# Patient Record
Sex: Female | Born: 1945 | Race: White | Hispanic: No | Marital: Married | Smoking: Never smoker
Health system: Southern US, Community
[De-identification: ages and names within clinical notes are randomized; demographics above are authoritative.]

## PROBLEM LIST (undated history)

## (undated) DIAGNOSIS — E039 Hypothyroidism, unspecified: Secondary | ICD-10-CM

## (undated) DIAGNOSIS — K219 Gastro-esophageal reflux disease without esophagitis: Secondary | ICD-10-CM

## (undated) DIAGNOSIS — F419 Anxiety disorder, unspecified: Secondary | ICD-10-CM

## (undated) DIAGNOSIS — IMO0001 Reserved for inherently not codable concepts without codable children: Secondary | ICD-10-CM

## (undated) DIAGNOSIS — M1990 Unspecified osteoarthritis, unspecified site: Secondary | ICD-10-CM

## (undated) DIAGNOSIS — C801 Malignant (primary) neoplasm, unspecified: Secondary | ICD-10-CM

## (undated) DIAGNOSIS — I10 Essential (primary) hypertension: Secondary | ICD-10-CM

## (undated) DIAGNOSIS — F329 Major depressive disorder, single episode, unspecified: Secondary | ICD-10-CM

## (undated) DIAGNOSIS — D759 Disease of blood and blood-forming organs, unspecified: Secondary | ICD-10-CM

## (undated) DIAGNOSIS — R251 Tremor, unspecified: Secondary | ICD-10-CM

## (undated) DIAGNOSIS — F32A Depression, unspecified: Secondary | ICD-10-CM

## (undated) DIAGNOSIS — E079 Disorder of thyroid, unspecified: Secondary | ICD-10-CM

## (undated) HISTORY — PX: KNEE SURGERY: SHX244

## (undated) HISTORY — DX: Anxiety disorder, unspecified: F41.9

## (undated) HISTORY — DX: Disorder of thyroid, unspecified: E07.9

## (undated) HISTORY — PX: SHOULDER ARTHROSCOPY: SHX128

## (undated) HISTORY — PX: ABDOMINAL HYSTERECTOMY: SHX81

## (undated) HISTORY — DX: Reserved for inherently not codable concepts without codable children: IMO0001

## (undated) HISTORY — PX: RECTOCELE REPAIR: SHX761

## (undated) HISTORY — DX: Essential (primary) hypertension: I10

## (undated) HISTORY — DX: Malignant (primary) neoplasm, unspecified: C80.1

## (undated) HISTORY — DX: Tremor, unspecified: R25.1

## (undated) HISTORY — PX: BLADDER REPAIR: SHX76

## (undated) HISTORY — PX: CHOLECYSTECTOMY: SHX55

## (undated) HISTORY — DX: Gastro-esophageal reflux disease without esophagitis: K21.9

---

## 2002-12-09 ENCOUNTER — Encounter: Payer: Self-pay | Admitting: Orthopedic Surgery

## 2002-12-10 ENCOUNTER — Ambulatory Visit (HOSPITAL_COMMUNITY): Admission: RE | Admit: 2002-12-10 | Discharge: 2002-12-10 | Payer: Self-pay | Admitting: Orthopedic Surgery

## 2003-09-30 ENCOUNTER — Inpatient Hospital Stay (HOSPITAL_COMMUNITY): Admission: RE | Admit: 2003-09-30 | Discharge: 2003-10-01 | Payer: Self-pay | Admitting: Orthopedic Surgery

## 2004-01-15 ENCOUNTER — Observation Stay (HOSPITAL_COMMUNITY): Admission: RE | Admit: 2004-01-15 | Discharge: 2004-01-16 | Payer: Self-pay | Admitting: Orthopedic Surgery

## 2005-04-27 ENCOUNTER — Other Ambulatory Visit: Admission: RE | Admit: 2005-04-27 | Discharge: 2005-04-27 | Payer: Self-pay | Admitting: Obstetrics and Gynecology

## 2006-12-18 ENCOUNTER — Ambulatory Visit (HOSPITAL_BASED_OUTPATIENT_CLINIC_OR_DEPARTMENT_OTHER): Admission: RE | Admit: 2006-12-18 | Discharge: 2006-12-18 | Payer: Self-pay | Admitting: Orthopedic Surgery

## 2007-07-24 ENCOUNTER — Ambulatory Visit (HOSPITAL_COMMUNITY): Admission: RE | Admit: 2007-07-24 | Discharge: 2007-07-25 | Payer: Self-pay | Admitting: Orthopedic Surgery

## 2007-09-06 ENCOUNTER — Encounter: Admission: RE | Admit: 2007-09-06 | Discharge: 2007-09-06 | Payer: Self-pay | Admitting: Orthopedic Surgery

## 2007-09-25 ENCOUNTER — Encounter: Admission: RE | Admit: 2007-09-25 | Discharge: 2007-09-25 | Payer: Self-pay | Admitting: Orthopedic Surgery

## 2007-10-07 ENCOUNTER — Ambulatory Visit (HOSPITAL_BASED_OUTPATIENT_CLINIC_OR_DEPARTMENT_OTHER): Admission: RE | Admit: 2007-10-07 | Discharge: 2007-10-08 | Payer: Self-pay | Admitting: Orthopedic Surgery

## 2010-06-12 ENCOUNTER — Encounter: Payer: Self-pay | Admitting: Orthopedic Surgery

## 2010-10-04 NOTE — Op Note (Signed)
Elizabeth Mcclain, Elizabeth Mcclain              ACCOUNT NO.:  [id number]   MEDICAL RECORD NO.:  [id number]          PATIENT TYPE:  OIB   LOCATION:  2550                         FACILITY:  [facility]   PHYSICIAN:  Marlowe Kays, M.D.  DATE OF BIRTH:  [birth year]   DATE OF PROCEDURE:  07/24/2007  DATE OF DISCHARGE:                               OPERATIVE REPORT   PREOPERATIVE DIAGNOSES:  1. 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  2. Probable superior labrum anterior posterior type tear with      posterior extension.  3. Medial subluxation of the biceps tendon at the biceps pulley.   POSTOPERATIVE DIAGNOSES:  1. Fraying of biceps tendon and rotator cuff and labrum.  2. Significant impingement syndrome with partial-thickness rotator      cuff tear.   OPERATION:  1. Left shoulder arthroscopy with debridement of biceps tendon, labrum      at the biceps anchor and the undersurface of the rotator cuff.  2. Arthroscopic subacromial decompression.  3. Inspection and shaving of partial-thickness bursal surface rotator      cuff tear.   SURGEON:  Marlowe Kays, M.D   ASSISTANT:  Druscilla Brownie. Underwood, P.A.-C.   ANESTHESIA:  General anesthesia preceded by inter-Scalene block.   INDICATIONS FOR PROCEDURE:  As stated in the above diagnoses.  She was  having pain.   DESCRIPTION OF PROCEDURE:  Prophylactic antibiotics.  Satisfactory  general anesthesia.  A beach chair position on the [place] frame.  The  left shoulder girdle was prepped with DuraPrep and draped in a sterile  field.  Time out performed.  The anatomy of the shoulder joint was  marked out.  A posterior soft spot in the lateral portals as well as the  subacromial space were all infiltrated with 0.5% Marcaine with  adrenalin.  Through a posterior soft spot portal atraumatically into the  glenoid humeral joint, on inspection the biceps tendon appeared to be  appropriately located.  There was  considerable fraying of it, both along  the tendon body, but also at its anchor where the labrum was also  minimally disrupted there as well.  Also there was some fraying of the  undersurface of the rotator cuff.  Accordingly, I advanced the scope  between the biceps tendon and subscapularis and then using a switching  stick, made an anterior incision, and I placed a metal cannula over the  switching stick, bringing it into the joint.  I then used a 4.2 shaver  to debride down below the surface of the rotator cuff, the biceps tendon  and the labrum at the biceps anchor.  At no time did there appear to be  a full-thickness rotator cuff tear.  I then redirected the scope in the  subacromial space.  She had a significant impingement problem, and a  good bit of bursal tissue, which I resected through a lateral portal and  using a 4.2 shaver.  I then brought the 90-degree ArthroCare vaporizer  for the lateral portal, and again removing soft tissue from the  undersurface of the acromion.  I followed this with a 4 mm oval bur,  burring down the underneath surface of the acromion.  I went back and  forth between the bur and the vaporizer until we had a wide  decompression, documented with films.  I then inspected the rotator cuff  in the area of the MRI abnormality in the anterior rotator cuff.  I did  find a partial-thickness tear measuring roughly 5 mm in width.  I  debrided this down with the shaver.  I could not penetrate the joint  through the rotator cuff with the shaver, so I concluded this was most  likely a partial-thickness tear and felt that now that we had relieved  the impingement problem, that an open procedure with tacking down was  not indicated.  Her family will be so notified of this decision.  We  then removed all fluid possible from the subacromial space. There was no  unusual bleeding.  The three portals were closed with #4-0 nylon and  infiltrated with 0.5% Marcaine with  adrenalin as well as the subacromial  space.  A Betadine and dry sterile dressing and shoulder immobilizer  were applied.   She tolerated the procedure well and was taken to the recovery room in  satisfactory condition, with no known complications.           ______________________________  Marlowe Kays, M.D.     JA/MEDQ  D:  07/24/2007  T:  07/24/2007  Job:  [id number]

## 2010-10-04 NOTE — Op Note (Signed)
NAMEREGNIA, MATHWIG              ACCOUNT NO.:  [id number]   MEDICAL RECORD NO.:  [id number]          PATIENT TYPE:  AMB   LOCATION:  DSC                          FACILITY:  [facility]   PHYSICIAN:  Marlowe Kays, M.D.  DATE OF BIRTH:  [birth year]   DATE OF PROCEDURE:  DATE OF DISCHARGE:  12/18/2006                               OPERATIVE REPORT   PREOPERATIVE DIAGNOSIS:  1. Torn medial and lateral menisci.  2. Osteoarthritis right knee.   POSTOPERATIVE DIAGNOSIS:  1. Torn medial and lateral menisci.  2. Osteoarthritis right knee.   OPERATION:  Right knee arthroscopy with:  1. Partial medial and lateral meniscectomy.  2. Shaving of medial femoral condyle and lateral tibial plateau.  3. Debridement of patella.   SURGEON:  Dr. Marlowe Kays.   ASSISTANT:  Nurse.   ANESTHESIA:  General.   DESCRIPTION OF PROCEDURE:  She has tricompartmental arthritic changes on  x-ray and on an MRI of 11/16/2006 which also demonstrated tears of both  menisci.  Accordingly, she is here today for the above-mentioned  surgery.   PROCEDURE:  After satisfactory general anesthesia, Ace wrap and knee  supports to the left lower extremity.  Pneumatic tourniquet to right  lower extremity with right leg Esmarched out, non sterilely.  Thigh  stabilizer applied.  Right leg was prepped from stabilizer to ankle with  DuraPrep and draped in a sterile field.  Time-out performed.  Superior  medial saline inflow first through an anterolateral portal.  The medial  compartment joint was evaluated.  Minor synovectomy was performed.  She  had extensive tearing of the entire posterior third of the medial  meniscus as well as a small area of flap disruption of the articular  cartilage, Grade II out of IV for posteriorly.  I smoothed down the flap  and resected the torn meniscus back to stable rim with combination of  baskets and using the 3.5 shaver.  Final remnant was stable.  I then  reversed portals.  She was  noted to have partial disruption of her ACL  which was pictured.  The lateral meniscus was frayed along the entire  inner border.  The anterior third was somewhat movable but appeared to  be intact and was left intact.  I shaved the inner border of the lateral  meniscus with 3.5 shaver and also smoothed down th lateral tibial  plateau.  There was a fairly extensive area of full-thickness wear.  Looking at the lateral gutter and suprapatellar area, the patella was  noted to be worn, Grade III out of IV, and was shaved down until smooth.  The joint was then irrigated until clear and all fluid possible removed.  The portals were closed with 4-0 nylon.  I injected 20 mL 0.5% Marcaine  with adrenalin, 4 mg of morphine through the inflow  apparatus which was removed and this portal closed with 4-0 nylon as  well.  Medium Adaptic, dry sterile dressing were applied.  Tourniquet  was released.  She tolerated the procedure well and was taken to the  recovery room in satisfactory condition with no known complications.  ______________________________  Marlowe Kays, M.D.     JA/MEDQ  D:  12/18/2006  T:  12/19/2006  Job:  [id number]

## 2010-10-04 NOTE — Op Note (Signed)
[name], Elizabeth Mcclain              ACCOUNT NO.:  [id number]   MEDICAL RECORD NO.:  [id number]          PATIENT TYPE:  AMB   LOCATION:  DSC                          FACILITY:  [facility]   PHYSICIAN:  Marlowe Kays, M.D.  DATE OF BIRTH:  [birth year]   DATE OF PROCEDURE:  10/07/2007  DATE OF DISCHARGE:  10/08/2007                               OPERATIVE REPORT   PREOPERATIVE DIAGNOSIS:  Rotator cuff tear, left shoulder.   POSTOPERATIVE DIAGNOSIS:  Rotator cuff tear, left shoulder.   OPERATION:  Anterior acromionectomy and repair of torn rotator cuff,  left shoulder.   SURGEON:  Marlowe Kays, MD   ASSISTANT:  Extra scrub nurse.   ANESTHESIA:  General.   PATHOLOGY AND INDICATIONS FOR PROCEDURE:  I performed an arthroscopic  subacromial decompression on her left shoulder on July 24, 2007.  She  subsequently developed pain and loss of motion leading to an arthrogram  of her left shoulder performed on Sep 25, 2007, which I personally  reviewed showing leakage of dye in the subacromial space without  retraction of the tendon.  Consequently, she is here for the above-  mentioned surgery.   PROCEDURE:  Preoperative interscalene block, prophylactic antibiotics,  satisfied general anesthesia in the beach-chair position on [place]  frame.  Left shoulder girdle was prepped with DuraPrep and draped in  sterile field.  [place] employed.  Time-out performed.  I made a short  incision over the distal acromion and proximal deltoid area.  With the  subperiosteal dissection, I dissected the fascia over the anterior  acromion back roughly to the [facility] joint, and undermined the residual  acromion with a Therapist, nutritional and some joint fluid came forth  confirming that there was a tear.  I then used a larger Cobb elevator  and performed an open anterior acromionectomy with a micro-saw to  provide not only better exposure but also additional decompression.  We  then looked for the rotator cuff tear and did  not see one initially.  There was an area of little soft tissue flap, which I excised; and there  was some bleeding there, and I cauterized this area.  There were several  other soft areas, but no definitive tear.  Accordingly, I mixed up a  solution of methylene blue and saline and injected through the posterior  capsule.  There was no frank extravasation of dye through the rotator  cuff with several areas superior to the greater tuberosity, where  robin's-egg blue and somewhat bulging consistent with areas of very  thinned out rotator cuff and suspicious for areas of tear that had  partly sealed over.  After assessing the pathology, I elected to use a  two-pronged Mitek anchor, which allowed me to cinch down the rotator  cuff lateral ward; and I supplemented this after tying the sutures down  with multiple sutures of #1 Ethibond from the anchor.  This seemed to  give a nice tight repair.  Once again; we, three of [place], inspected the  rotator cuff and found no evidence for any tear elsewhere.  I then  irrigated the shoulder well with  sterile saline and  closed the fascia over the anterior acromion as well as a small slit in  the deltoid with interrupted 0 Vicryl.  Steri-Strips on the skin.  Dry  sterile dressing and shoulder immobilizer applied.  She tolerated the  procedure well and was taken to recovery room in satisfactory condition  with no known complications.           ______________________________  Marlowe Kays, M.D.     JA/MEDQ  D:  10/07/2007  T:  10/08/2007  Job:  [id number]

## 2010-10-07 NOTE — Op Note (Signed)
NAME:  Elizabeth Mcclain, Elizabeth Mcclain                        ACCOUNT NO.:  [id number]   MEDICAL RECORD NO.:  [id number]                   PATIENT TYPE:  OBV   LOCATION:  0470                                 FACILITY:  [facility]   PHYSICIAN:  Marlowe Kays, M.D.               DATE OF BIRTH:  07/22/1945   DATE OF PROCEDURE:  01/15/2004  DATE OF DISCHARGE:  01/16/2004                                 OPERATIVE REPORT   PREOPERATIVE DIAGNOSES:  Right carpal tunnel syndrome.   POSTOPERATIVE DIAGNOSES:  Right carpal tunnel syndrome.   OPERATION:  Decompression of the median nerve right wrist and hand.   SURGEON:  Marlowe Kays, M.D.   ASSISTANT:  Nurse.   ANESTHESIA:  General.   PATHOLOGY AND JUSTIFICATION FOR PROCEDURE:  Signs and symptoms of carpal  tunnel syndrome with nerve conduction studies demonstrating moderately  severe damage to the median nerve at surgery. She did have marked  discoloration thinning out of the median nerve in the carpal canal.   DESCRIPTION OF PROCEDURE:  IV regional was first attempted but was  unsuccessful and accordingly proceeded with a general anesthetic.  Pneumatic  tourniquet utilized. I marked out the incision along the base of thenar  eminence crossing obliquely over the flexor crease of the wrist in the  distal forearm. The palmaris longus tendon beneath the median nerve were  identified at the wrist.  Bipolar cautery utilized. I progressively released  the skin and fascia into the distal palm. Individual nerve branches were  dissected out. When the decompression was completed, I irrigated the wound  well with sterile saline, released the tourniquet, there were no major  bleeders.  I injected the wound with 0.5% plain Marcaine and closed it with  interrupted 4-0 nylon mattress sutures and the skin and subcutaneous tissue.  Betadine Adaptic dry sterile dressing with a volar plaster splint were  applied. She tolerated the procedure well and was taken to the  recovery room  in satisfactory condition with no known complications.                                               Marlowe Kays, M.D.    JA/MEDQ  D:  01/15/2004  T:  01/16/2004  Job:  [id number]

## 2010-10-07 NOTE — Op Note (Signed)
NAME:  Elizabeth Mcclain, Elizabeth Mcclain                        ACCOUNT NO.:  [id number]   MEDICAL RECORD NO.:  [id number]                   PATIENT TYPE:  AMB   LOCATION:  DAY                                  [facility]ACILITY:  [facility]   PHYSICIAN:  Marlowe Kays, M.D.               DATE OF BIRTH:  22-May-1946   DATE OF PROCEDURE:  12/10/2002  DATE OF DISCHARGE:                                 OPERATIVE REPORT   PREOPERATIVE DIAGNOSIS:  Torn medial meniscus, left knee.   POSTOPERATIVE DIAGNOSIS:  Torn medial and lateral menisci, left knee.   OPERATION:  Left knee arthroscopy with partial medial meniscectomy.   SURGEON:  Illene Labrador. [place]ton, M.D.   ASSISTANT:  Nurse.   ANESTHESIA:  General.   PATHOLOGY AND JUSTIFICATION FOR PROCEDURE:  She has had pain in the left  knee for over three months.  No definite injury.  Findings on exam were  nonspecific with x-rays looking good.  Accordingly, I sent her for an MRI  which was performed on November 18, 2002, and showed a posterior horn tear of  the medial meniscus.  Accordingly, she is here today for surgery.  Arthroscopic findings confirmed the MRI picture.   DESCRIPTION OF PROCEDURE:  Satisfactory general anesthesia, pneumatic  tourniquet, thigh stabilizer.  Left knee was prepped with DuraPrep and  draped as a sterile field.  Superior and medial saline inflow.  First  through an anterolateral portal, the medial compartment of the knee joint  was evaluated.  She did have some disruption of the anterior third of the  medial meniscus with some synovitis which I resected and smoothed down the  meniscus.  It did appear to be stable on probing with no detachment around  the perimeter.  There was some minimal wear of the medial femoral condyle  which I smoothed down with a 3.5 shaver.  Posteriorly, she had extensive  tear of the posterior curve of the medial meniscus which I resected back to  a stable rim with small baskets and then shaved down until smooth with  the  3.5 shaver.  There did appear to be some slight amount of bleeding from  somewhere in the posterior joint, and I could not tell for certain its  location throughout the case.  I then looked up in the medial gutter and  suprapatellar area.  There was minimal wear in the patella but no  abnormalities arthroscopically were noted.  Looking laterally, there was  minimal wear on the joint surfaces and some minimal fraying of the lateral  meniscus but otherwise, nothing was arthroscopically treated.  The knee  joint was then irrigated until clear and all fluid possible removed.  The  two anterior portals were closed with 4-0 nylon.  Marcaine 0.5% 20 cc with  adrenalin and 4 mg of morphine were then instilled through the inflow  apparatus which was removed and this portal closed with 4-0 nylon as  well.  Betadine, Adaptic dry sterile dressing were applied.  Tourniquet was  released.  She tolerated the procedure and was taken to the recovery room in  satisfactory condition with no known complications.                                               Marlowe Kays, M.D.    JA/MEDQ  D:  12/10/2002  T:  12/10/2002  Job:  [id number]

## 2010-10-07 NOTE — Op Note (Signed)
NAME:  Elizabeth Mcclain, Elizabeth Mcclain                        ACCOUNT NO.:  [id number]   MEDICAL RECORD NO.:  [id number]                   PATIENT TYPE:  AMB   LOCATION:  DAY                                  FACILITY:  [facility]   PHYSICIAN:  Marlowe Kays, M.D.               DATE OF BIRTH:  Nov 09, 1945   DATE OF PROCEDURE:  DATE OF DISCHARGE:                                 OPERATIVE REPORT   PREOPERATIVE DIAGNOSES:  1. Degenerative arthritis, acromioclavicular joint.  2. Full thickness retracted rotator cuff tear, right shoulder.   POSTOPERATIVE DIAGNOSES:  1. Degenerative arthritis, acromioclavicular joint.  2. Full thickness retracted rotator cuff tear, right shoulder.   OPERATION:  1. Open resection, distal right clavicle.  2. Anterior acromionectomy and repair of torn rotator cuff, right shoulder.   SURGEON:  Marlowe Kays, M.D.   ASSISTANT:  John _________, P.A.-C.   ANESTHESIA:  General.   INDICATIONS FOR PROCEDURE:  Chronic progressive pain, right shoulder, with  MRI demonstrating the above-mentioned findings.  The rotator cuff tear was  triangular in shape due to retraction, measuring about 2 cm in width and 1.7  to 2 cm from the greater tuberosity to apex.   PROCEDURE:  Prophylactic antibiotics.  Satisfactory general anesthesia.  Beach chair position on the sliding frame.  The right shoulder was prepped  with Duraprep and draped in the sterile field.  The [place] employed.  I made  a curved incision along the distal clavicle, coursing vertically and at the  mid portion at the rotator cuff.  The [facility] joint was identified and was indeed  quite degenerated.  With cutting cautery, I did pair off the resection with  the distal clavicle and the anterior acromion.  After measuring medially  about 1.5 cm from the [facility] joint, I placed baby Holman's beneath there and  amputated the clavicle at this point, grasping the cupped portion with a tie  clip and dissecting it out with cutting cautery.   I removed several small  bony spicules on the underneath surface of the parent clavicle and then  covered it with bone wax.  Next, I undermined the anterior acromion with  Cobb elevator and marked out my initial line for an anterior acromionectomy,  which I performed, protecting the underlying rotator cuff and using a micro  saw.  I then removed this fragment and performed some additional subacromial  decompression with the saw.  The rotator cuff tear was then identified, as  noted above.  After looking on the underneath surface and finding the biceps  and tendon to be intact and that there was minimal layering, I placed two  apical stitches of #1 Ethibond, bringing the two halves of the rotator cuff  somewhat together.  I then denuded the articular cartilage above the greater  tuberosity and used a super Mitek anchor, and I took a good bite of the two  wings.  With the arm abducted,  I then placed addition side-to-side #1  Ethibond sutures and tied the limbs of the Mitek anchor.  I then further  brought the two wings of the rotator cuff together, giving a nice solid  repair.  There is no residual impingement.  The wound was then irrigated  with sterile saline.  I placed Gelfoam in the clavicle resection site.  I  then repaired the deltoid muscle in two layers with interrupted #1 Vicryl  and fascia over the distal clavicle and  acromion with the same.  Subcutaneous tissue was closed with a combination  of #1 Tisseel Vicryl and Steri-Strips on the skin.  A dry sterile dressing  and short immobilizer applied.  She tolerated the procedure well and was  taken to the recovery room in satisfactory condition with no known  complications.                                               Marlowe Kays, M.D.    JA/MEDQ  D:  09/29/2003  T:  09/29/2003  Job:  [id number]

## 2010-10-07 NOTE — Discharge Summary (Signed)
NAME:  Elizabeth Mcclain, Elizabeth Mcclain                        ACCOUNT NO.:  [id number]   MEDICAL RECORD NO.:  [id number]                   PATIENT TYPE:  INP   LOCATION:  0446                                 FACILITY:  [facility]   PHYSICIAN:  Marlowe Kays, M.D.               DATE OF BIRTH:  August 26, 1945   DATE OF ADMISSION:  09/29/2003  DATE OF DISCHARGE:  10/01/2003                                 DISCHARGE SUMMARY   ADMISSION DIAGNOSES:  1. Degenerative arthritis, right acromioclavicular joint with tear of the     rotator cuff of the right shoulder and impingement.  2. Hypertension.   DISCHARGE DIAGNOSES:  1. Degenerative arthritis, right acromioclavicular joint with tear of the     rotator cuff of the right shoulder and impingement.  2. Hypertension.   OPERATION:  On Sep 29, 2003,  the patient underwent an open resection,  distal right clavicle and anterior acromionectomy and repair of the torn  rotator cuff of the right shoulder.   ASSISTANT:  Dooley L. Idolina Primer, P.A-C.   CONSULTS:  None.   BRIEF HISTORY:  This 65 year old lady had progressive problems concerning  her right shoulder.  She had back pain as well as difficulty with her day-to-  day activities secondary to her pain.  MRI showed a full-thickness tear of  the distal aspect of the supraspinatus tendon and a prominent  acromioclavicular joint with degenerative changes.  Also, a downsloping of  the underside of the acromion, resulting in impingement to the rotator cuff.  As this patient is very active, it is felt that she would benefit from  surgical intervention and was admitted for the above procedure.   HOSPITAL COURSE:  The patient tolerated the surgical procedure quite well.  A shoulder block was used during the surgery.  She did well until the block  wore off.  Then she had a considerable amount of pain and discomfort in the  right shoulder.  We offered her Dilaudid PCA, Demerol tabs, as well as  Talwin for her discomfort.   Muscle relaxants were also used.  Her pain was  so severe that she really could not get out of bed or move about.  We felt  it would be in her best interest to keep her another day until we could wean  her from IV analgesics.  OT was not given on the first postop day, as  ordered, due to the patient's pain.   On the day of discharge, her husband was present.  We went over the  discharge instructions again.  She had a little less discomfort, was  ambulating in the hall, getting up and going to the bathroom.  Still was  using the PCA analgesic.  We will try p.o. analgesics only and see if she  can go home today.   Neurovascular is intact to the right upper extremity.  Vital signs were  stable.   The dressing  had been changed on the first postop day, and the dressing was  dry.   Laboratory values in the hospital hematologically showed a hemoglobin and  hematocrit of 12.8 and 38.1.  Glucose was 102.   Chest x-ray showed mild left ventricular hypertrophy.  No active disease.   Electrocardiogram was normal sinus rhythm.   CONDITION ON DISCHARGE:  Improved.  Stable.   PLAN:  Patient is discharged to her home in the care of her husband.  Return  to see [place] after about two weeks of the date of surgery.  Use dry dressing to  the right shoulder p.r.n.  Wear the sling 90% of the time.  No range of  motion of the right shoulder.  She may shower in four days.  All questions  were encouraged and answered from the husband and her.   DISCHARGE MEDICATIONS:  Continue with home medication and diet.  We wrote  prescriptions for Robaxin 500 mg 1 q.6h. p.r.n. muscle spasm #30 with 2  refills, Mepergan Fortis 1-2 q.4-6h. p.r.n. pain #40,  Talwin NX 1 q.3-4h.  p.r.n. pain #40 with 1 refill.     Dooley L. Cherlynn June.                 Marlowe Kays, M.D.    DLU/MEDQ  D:  10/01/2003  T:  10/01/2003  Job:  [id number]

## 2010-10-07 NOTE — H&P (Signed)
NAME:  Elizabeth Mcclain, Elizabeth Mcclain                        ACCOUNT NO.:  [id number]   MEDICAL RECORD NO.:  [id number]                   PATIENT TYPE:  AMB   LOCATION:  DAY                                  FACILITY:  [facility]   PHYSICIAN:  Marlowe Kays, M.D.               DATE OF BIRTH:  1946/03/03   DATE OF ADMISSION:  DATE OF DISCHARGE:                                HISTORY & PHYSICAL   ANTICIPATED DATE OF ADMISSION:  She is scheduled to be admitted to [facility] on Sep 29, 2003.   CHIEF COMPLAINT:  Pain in my right shoulder.   HISTORY OF PRESENT ILLNESS:  This 65 year old white female who is seen by [place]  for continuing progressive problems concerning pain into her right shoulder.  She recalls no trauma to this shoulder but has had increasing pain and  difficulty using the right shoulder in day-to-day activities.  She has  painful popping and clicking into the shoulder.  She has difficulty sleeping  secondary to the pain in the right shoulder.  Examination of the cervical  spine is unrestricted.  She has no pain radiating from the cervical spine.  It is primarily into the right shoulder.   The patient has pain with range of motion both actively and passively.  At  the time of the examination, she has tenderness to palpation over the  rotator cuff area.  When this is done, occasionally the pain will radiate  into the upper arm into the elbow.  MRI done on the right shoulder has shown  a full-thickness tear in the distal aspect of the supraspinatus tendon.  Also seen was a prominent acromioclavicular joint degenerative changes.  She  also has a downsloping of the acromion and curvature in the anterior aspect  of the acromion.  It is felt this patient has several problems here.  One  was impingement which has resulted in a tear of the rotator cuff of the  right shoulder.  Also a contributing factor to her overall discomfort could  be the [facility] arthrosis.  After much discussion and  consideration concerning the  patient's level of pain interfering with her day-to-day activities, it is  felt she would benefit from surgical intervention with an open anterior  acromionectomy with repair of the torn rotator cuff and open distal clavicle  resection.   PAST MEDICAL HISTORY:  This patient is a patient of the [facility].  She is ALLERGIC to PENICILLIN, PERCODAN and PERCOCET.  Her  current medications are Diovan 25 mg in the evening, Norvasc 5 mg in the  evening, and Paxil 40 mg in the evening.  She does have anxiety and  depression as well as hypertension and reflux disease.   Surgeries have been a hysterectomy in 1997, knee arthroscopy in 2004, and  melanoma removed in 1993.   FAMILY HISTORY:  Positive for heart disease in the father as  well as  hypertension and skin cancer in her father and breast cancer in her mother.   SOCIAL HISTORY:  The patient is married.  Works for [employer].  Has no intake of alcohol or tobacco products and her husband  will be her primary caregiver after surgery.   REVIEW OF SYSTEMS:  CNS:  No seizures, stroke or paralysis, numbness, double  vision.  RESPIRATORY:  No productive cough, no hemoptysis, no shortness of  breath.  CARDIOVASCULAR:  No chest pain, no angina, no orthopnea.  GASTROINTESTINAL:  No nausea, vomiting, melena, or bloody stools.  GENITOURINARY:  No discharge, dysuria, or hematuria.  MUSCULOSKELETAL:  Primarily in the present illness of the right shoulder.   PHYSICAL EXAMINATION:  GENERAL:  Alert and cooperative, friendly, well-  groomed and dressed 65 year old white female who is somewhat anxious.  VITAL SIGNS:  Blood pressure 130/86, pulse 88 regular, respirations 12.  HEENT:  Normocephalic.  PERRLA.  Oropharynx is clear.  NECK:  Supple and no lymphadenopathy.  CHEST:  Clear to auscultation.  No rhonchi, no rales, no wheezes.  HEART:  Regular rate and rhythm.  No murmurs are heard.   ABDOMEN:  Soft, nontender.  Liver, spleen not felt.  GENITALIA, RECTAL, PELVIC, BREASTS:  Not examined.  Not not pertinent to the  present illness.  EXTREMITIES:  The right shoulder as in the present illness above.   ADMISSION DIAGNOSES:  1. 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  2. Hypertension.   PLAN:  The patient will undergo open anterior acromionectomy with repair of  the right rotator cuff and open distal clavicle resection.  The possible  complications of the surgery have been discussed with the patient.  She  agrees to go ahead with the above procedure.     Dooley L. Cherlynn June.                 Marlowe Kays, M.D.    DLU/MEDQ  D:  09/24/2003  T:  09/24/2003  Job:  [id number]

## 2011-02-13 LAB — URINALYSIS, ROUTINE W REFLEX MICROSCOPIC
Bilirubin Urine: NEGATIVE
Glucose, UA: NEGATIVE
Hgb urine dipstick: NEGATIVE
Specific Gravity, Urine: 1.021
Urobilinogen, UA: 0.2

## 2011-02-13 LAB — DIFFERENTIAL
Basophils Relative: 0
Lymphs Abs: 2.8
Monocytes Relative: 10
Neutro Abs: 4.3
Neutrophils Relative %: 52

## 2011-02-13 LAB — CBC
HCT: 41.2
Hemoglobin: 13.6
MCHC: 33.2
MCV: 90
RDW: 14.6

## 2011-02-13 LAB — COMPREHENSIVE METABOLIC PANEL
Alkaline Phosphatase: 68
BUN: 14
Calcium: 9.6
Glucose, Bld: 95
Total Protein: 7.2

## 2011-02-13 LAB — PROTIME-INR
INR: 0.9
Prothrombin Time: 11.8

## 2011-02-13 LAB — TYPE AND SCREEN

## 2011-02-13 LAB — APTT: aPTT: 27

## 2011-02-15 LAB — POCT I-STAT, CHEM 8
BUN: 16
Chloride: 103
Creatinine, Ser: 0.9
Glucose, Bld: 100 — ABNORMAL HIGH
Hemoglobin: 13.9
Potassium: 4.3

## 2011-03-06 LAB — I-STAT 8, (EC8 V) (CONVERTED LAB)
BUN: 22
Chloride: 108
Glucose, Bld: 100 — ABNORMAL HIGH
Hemoglobin: 14.6
Potassium: 3.5
Sodium: 141
pH, Ven: 7.443 — ABNORMAL HIGH

## 2014-12-22 DIAGNOSIS — Z1231 Encounter for screening mammogram for malignant neoplasm of breast: Secondary | ICD-10-CM | POA: Diagnosis not present

## 2014-12-22 DIAGNOSIS — M899 Disorder of bone, unspecified: Secondary | ICD-10-CM | POA: Diagnosis not present

## 2014-12-22 DIAGNOSIS — Z803 Family history of malignant neoplasm of breast: Secondary | ICD-10-CM | POA: Diagnosis not present

## 2015-01-04 DIAGNOSIS — G249 Dystonia, unspecified: Secondary | ICD-10-CM | POA: Insufficient documentation

## 2015-01-04 DIAGNOSIS — G250 Essential tremor: Secondary | ICD-10-CM | POA: Insufficient documentation

## 2015-02-16 DIAGNOSIS — J0100 Acute maxillary sinusitis, unspecified: Secondary | ICD-10-CM | POA: Diagnosis not present

## 2015-04-06 DIAGNOSIS — Z0000 Encounter for general adult medical examination without abnormal findings: Secondary | ICD-10-CM | POA: Diagnosis not present

## 2015-04-06 DIAGNOSIS — Z79899 Other long term (current) drug therapy: Secondary | ICD-10-CM | POA: Diagnosis not present

## 2015-04-06 DIAGNOSIS — R5383 Other fatigue: Secondary | ICD-10-CM | POA: Diagnosis not present

## 2015-04-06 DIAGNOSIS — Z23 Encounter for immunization: Secondary | ICD-10-CM | POA: Diagnosis not present

## 2015-04-06 DIAGNOSIS — D509 Iron deficiency anemia, unspecified: Secondary | ICD-10-CM | POA: Diagnosis not present

## 2015-04-06 DIAGNOSIS — D510 Vitamin B12 deficiency anemia due to intrinsic factor deficiency: Secondary | ICD-10-CM | POA: Diagnosis not present

## 2015-04-06 DIAGNOSIS — E7800 Pure hypercholesterolemia, unspecified: Secondary | ICD-10-CM | POA: Diagnosis not present

## 2015-04-06 DIAGNOSIS — I10 Essential (primary) hypertension: Secondary | ICD-10-CM | POA: Diagnosis not present

## 2015-04-30 DIAGNOSIS — M436 Torticollis: Secondary | ICD-10-CM | POA: Insufficient documentation

## 2015-04-30 DIAGNOSIS — G249 Dystonia, unspecified: Secondary | ICD-10-CM | POA: Diagnosis not present

## 2015-04-30 DIAGNOSIS — R519 Headache, unspecified: Secondary | ICD-10-CM | POA: Insufficient documentation

## 2015-04-30 DIAGNOSIS — G250 Essential tremor: Secondary | ICD-10-CM | POA: Diagnosis not present

## 2015-04-30 DIAGNOSIS — R51 Headache: Secondary | ICD-10-CM | POA: Diagnosis not present

## 2015-06-17 DIAGNOSIS — M436 Torticollis: Secondary | ICD-10-CM | POA: Diagnosis not present

## 2015-06-17 DIAGNOSIS — G249 Dystonia, unspecified: Secondary | ICD-10-CM | POA: Diagnosis not present

## 2015-07-22 DIAGNOSIS — G249 Dystonia, unspecified: Secondary | ICD-10-CM | POA: Diagnosis not present

## 2015-07-22 DIAGNOSIS — M436 Torticollis: Secondary | ICD-10-CM | POA: Diagnosis not present

## 2015-08-06 DIAGNOSIS — I10 Essential (primary) hypertension: Secondary | ICD-10-CM | POA: Diagnosis not present

## 2015-08-06 DIAGNOSIS — Z1389 Encounter for screening for other disorder: Secondary | ICD-10-CM | POA: Diagnosis not present

## 2015-08-16 ENCOUNTER — Encounter: Payer: Self-pay | Admitting: Podiatry

## 2015-08-16 ENCOUNTER — Ambulatory Visit (INDEPENDENT_AMBULATORY_CARE_PROVIDER_SITE_OTHER): Payer: Medicare Other

## 2015-08-16 ENCOUNTER — Ambulatory Visit (INDEPENDENT_AMBULATORY_CARE_PROVIDER_SITE_OTHER): Payer: Medicare Other | Admitting: Podiatry

## 2015-08-16 VITALS — BP 140/87 | HR 94 | Resp 14

## 2015-08-16 DIAGNOSIS — I739 Peripheral vascular disease, unspecified: Secondary | ICD-10-CM | POA: Diagnosis not present

## 2015-08-16 DIAGNOSIS — R52 Pain, unspecified: Secondary | ICD-10-CM

## 2015-08-16 DIAGNOSIS — M2042 Other hammer toe(s) (acquired), left foot: Secondary | ICD-10-CM

## 2015-08-16 DIAGNOSIS — M2012 Hallux valgus (acquired), left foot: Secondary | ICD-10-CM | POA: Diagnosis not present

## 2015-08-16 NOTE — Progress Notes (Signed)
   Subjective:    Patient ID: Elizabeth Mcclain, female    DOB: May 05, 1946, 70 y.o.   MRN: [id number]  HPI this patient presents the office with chief complaint of pain in both feet. She says she has pain in her big toe joint of her left foot and pain because the second toe is lifted and rubs her shoe. She also says that she is having sharp shooting pains through the second left  and fourth toe right for about 4 months. She does relate having a red streak on the bottom of her fourth toe, right foot. Finally, she is concerned about calluses under the middle  of her big toes both feet.  She says she has self treated with soaking in salt water but no benefit occurred. She presents the office today for an evaluation and treatment of this condition The patient presents here with B/L feet soreness.   Review of Systems  All other systems reviewed and are negative.      Objective:   Physical Exam GENERAL APPEARANCE: Alert, conversant. Appropriately groomed. No acute distress.  VASCULAR: Pedal pulses are  palpable at  [facility] and PT bilateral.  Capillary refill time is immediate to all digits,  Normal temperature gradient.   NEUROLOGIC: sensation is normal to 5.07 monofilament at 5/5 sites bilateral.  Light touch is intact bilateral, Muscle strength normal.  MUSCULOSKELETAL: acceptable muscle strength, tone and stability bilateral.  Intrinsic muscluature intact bilateral.  Rectus appearance of foot and digits noted bilateral. 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  There is redness and inflammation at the level of PIPJ second toe left foot.  DERMATOLOGIC: skin color, texture, and turgor are within normal limits.  No preulcerative lesions or ulcers  are seen, no interdigital maceration noted.  No open lesions present.  Digital nails are asymptomatic. No drainage noted. There is redness under the plantar aspect fourth toe right foot. This area appears to be vascular in nature.           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  Her left foot pain  seems to be secondary to her bunion and hammer toe deformity.  Her fourth toe right seems to be related to her circulation.  Therefore I recommended she seek a vascular consult and   in the interim take NSAIDs or ASA. Her pain seems to parallel the winter season despite good pulses both feet.   Gardiner Barefoot DPM

## 2015-08-16 NOTE — Addendum Note (Signed)
Addended by: Ezzard Flax, Jaileigh Weimer L on: 08/16/2015 02:00 PM   Modules accepted: Medications

## 2015-12-28 DIAGNOSIS — S6391XA Sprain of unspecified part of right wrist and hand, initial encounter: Secondary | ICD-10-CM | POA: Diagnosis not present

## 2015-12-28 DIAGNOSIS — S60221A Contusion of right hand, initial encounter: Secondary | ICD-10-CM | POA: Diagnosis not present

## 2015-12-28 DIAGNOSIS — S60211A Contusion of right wrist, initial encounter: Secondary | ICD-10-CM | POA: Diagnosis not present

## 2016-03-09 DIAGNOSIS — M79605 Pain in left leg: Secondary | ICD-10-CM | POA: Diagnosis not present

## 2016-03-09 DIAGNOSIS — M79604 Pain in right leg: Secondary | ICD-10-CM | POA: Diagnosis not present

## 2016-03-09 DIAGNOSIS — G250 Essential tremor: Secondary | ICD-10-CM | POA: Diagnosis not present

## 2016-03-11 DIAGNOSIS — R21 Rash and other nonspecific skin eruption: Secondary | ICD-10-CM | POA: Diagnosis not present

## 2016-03-22 DIAGNOSIS — E559 Vitamin D deficiency, unspecified: Secondary | ICD-10-CM | POA: Diagnosis not present

## 2016-03-22 DIAGNOSIS — I10 Essential (primary) hypertension: Secondary | ICD-10-CM | POA: Diagnosis not present

## 2016-03-22 DIAGNOSIS — R5383 Other fatigue: Secondary | ICD-10-CM | POA: Diagnosis not present

## 2016-03-22 DIAGNOSIS — D510 Vitamin B12 deficiency anemia due to intrinsic factor deficiency: Secondary | ICD-10-CM | POA: Diagnosis not present

## 2016-03-22 DIAGNOSIS — Z23 Encounter for immunization: Secondary | ICD-10-CM | POA: Diagnosis not present

## 2016-03-22 DIAGNOSIS — M79604 Pain in right leg: Secondary | ICD-10-CM | POA: Diagnosis not present

## 2016-03-22 DIAGNOSIS — E785 Hyperlipidemia, unspecified: Secondary | ICD-10-CM | POA: Diagnosis not present

## 2016-03-22 DIAGNOSIS — L989 Disorder of the skin and subcutaneous tissue, unspecified: Secondary | ICD-10-CM | POA: Diagnosis not present

## 2016-03-22 DIAGNOSIS — Z13220 Encounter for screening for lipoid disorders: Secondary | ICD-10-CM | POA: Diagnosis not present

## 2016-03-23 DIAGNOSIS — Z803 Family history of malignant neoplasm of breast: Secondary | ICD-10-CM | POA: Diagnosis not present

## 2016-03-23 DIAGNOSIS — Z1231 Encounter for screening mammogram for malignant neoplasm of breast: Secondary | ICD-10-CM | POA: Diagnosis not present

## 2016-03-28 DIAGNOSIS — L0390 Cellulitis, unspecified: Secondary | ICD-10-CM | POA: Diagnosis not present

## 2016-04-28 DIAGNOSIS — K429 Umbilical hernia without obstruction or gangrene: Secondary | ICD-10-CM | POA: Diagnosis not present

## 2016-04-28 DIAGNOSIS — Z01818 Encounter for other preprocedural examination: Secondary | ICD-10-CM | POA: Diagnosis not present

## 2016-04-28 DIAGNOSIS — Z832 Family history of diseases of the blood and blood-forming organs and certain disorders involving the immune mechanism: Secondary | ICD-10-CM | POA: Diagnosis not present

## 2016-05-01 ENCOUNTER — Ambulatory Visit: Payer: Self-pay | Admitting: Surgery

## 2016-05-01 DIAGNOSIS — K429 Umbilical hernia without obstruction or gangrene: Secondary | ICD-10-CM | POA: Diagnosis not present

## 2016-05-01 NOTE — H&P (Signed)
Elizabeth Mcclain 05/01/2016 11:06 AM Location: [facility] Patient #: [id number] DOB: 21-Jul-1945 Married / Language: English / Race: White Female  History of Present Illness Elizabeth Mcclain A. Elizabeth Fuhrmann MD; 05/01/2016 2:11 PM) Patient words: hernia    The patient was sent at the request of Dr. Nicki Reaper for umbilical hernia. She has had this for one year. It is getting larger. It is causing mild to moderate discomfort with straining. Denies any history of nausea or vomiting with this. No history of incarceration noted. There's been no redness or drainage from the site.  The patient is a 70 year old female.   Past Surgical History [name], CMA; 05/01/2016 11:06 AM) Breast Biopsy Left. Hysterectomy (not due to cancer) - Partial Knee Surgery Bilateral. Shoulder Surgery Bilateral.  Diagnostic Studies History [name], CMA; 05/01/2016 11:06 AM) Colonoscopy 5-10 years ago Mammogram within last year  Allergies [name], CMA; 05/01/2016 11:07 AM) Percocet *ANALGESICS - OPIOID* Penicillin G Pot in Dextrose *PENICILLINS* Rash.  Medication History [name], CMA; 05/01/2016 11:08 AM) Losartan Potassium (100MG  Tablet, Oral) Active. ALPRAZolam (0.25MG  Tablet, Oral) Active. HydroCHLOROthiazide (25MG  Tablet, Oral) Active. PARoxetine HCl (40MG  Tablet, Oral) Active. Levothyroxine Sodium (25MCG Tablet, Oral) Active. Vitamin D2 (Oral) Specific strength unknown - Active. Medications Reconciled  Social History [name], CMA; 05/01/2016 11:06 AM) Caffeine use Carbonated beverages, Tea. No alcohol use No drug use Tobacco use Never smoker.  Family History Sharyn Lull R. Rolena Infante, CMA; 05/01/2016 11:06 AM) Arthritis Mother. Bleeding disorder Sister. Breast Cancer Mother. Cancer Family Members In General. Cerebrovascular Accident Brother. Heart disease in female family member before age 50 Hypertension  Father. Kidney Disease Mother, Sister. Respiratory Condition Father. Thyroid problems Father.  Pregnancy / Birth History Sharyn Lull R. Rolena Infante, CMA; 05/01/2016 11:06 AM) Age at menarche 20 years. Para 2  Other Problems ([name], CMA; 05/01/2016 11:06 AM) Anxiety Disorder Arthritis Bladder Problems High blood pressure Melanoma Thyroid Cancer Thyroid Disease     Review of Systems [facility] R. Brooks CMA; 05/01/2016 11:06 AM) General Not Present- Appetite Loss, Chills, Fatigue, Fever, Night Sweats, Weight Gain and Weight Loss. Skin Not Present- Change in Wart/Mole, Dryness, Hives, Jaundice, New Lesions, Non-Healing Wounds, Rash and Ulcer. HEENT Not Present- Earache, Hearing Loss, Hoarseness, Nose Bleed, Oral Ulcers, Ringing in the Ears, Seasonal Allergies, Sinus Pain, Sore Throat, Visual Disturbances, Wears glasses/contact lenses and Yellow Eyes. Respiratory Not Present- Bloody sputum, Chronic Cough, Difficulty Breathing, Snoring and Wheezing. Breast Not Present- Breast Mass, Breast Pain, Nipple Discharge and Skin Changes. Cardiovascular Not Present- Chest Pain, Difficulty Breathing Lying Down, Leg Cramps, Palpitations, Rapid Heart Rate, Shortness of Breath and Swelling of Extremities. Gastrointestinal Present- Abdominal Pain. Not Present- Bloating, Bloody Stool, Change in Bowel Habits, Chronic diarrhea, Constipation, Difficulty Swallowing, Excessive gas, Gets full quickly at meals, Hemorrhoids, Indigestion, Nausea, Rectal Pain and Vomiting. Female Genitourinary Present- Urgency. Not Present- Frequency, Nocturia, Painful Urination and Pelvic Pain. Musculoskeletal Not Present- Back Pain, Joint Pain, Joint Stiffness, Muscle Pain, Muscle Weakness and Swelling of Extremities. Neurological Present- Tremor. Not Present- Decreased Memory, Fainting, Headaches, Numbness, Seizures, Tingling, Trouble walking and Weakness. Psychiatric Present- Anxiety. Not Present- Bipolar,  Change in Sleep Pattern, Depression, Fearful and Frequent crying. Endocrine Present- Hair Changes. Not Present- Cold Intolerance, Excessive Hunger, Heat Intolerance, Hot flashes and New Diabetes. Hematology Present- Easy Bruising. Not Present- Blood Thinners, Excessive bleeding, Gland problems, HIV and Persistent Infections.  Vitals [employer] R. Brooks CMA; 05/01/2016 11:06 AM) 05/01/2016 11:06 AM Weight: 160.38 lb Height: 60in  Body Surface Area: 1.7 m Body Mass Index: 31.32 kg/m  BP: 132/86 (Sitting, Left Arm, Standard)      Physical Exam (Alilah Mcmeans A. Kauan Kloosterman MD; 05/01/2016 2:11 PM)  General Mental Status-Alert. General Appearance-Consistent with stated age. Hydration-Well hydrated. Voice-Normal.  Head and Neck Head-normocephalic, atraumatic with no lesions or palpable masses. Trachea-midline. Thyroid Gland Characteristics - normal size and consistency.  Eye Eyeball - Bilateral-Extraocular movements intact. Sclera/Conjunctiva - Bilateral-No scleral icterus.  Chest and Lung Exam Chest and lung exam reveals -quiet, even and easy respiratory effort with no use of accessory muscles and on auscultation, normal breath sounds, no adventitious sounds and normal vocal resonance. Inspection Chest Wall - Normal. Back - normal.  Abdomen Note: Reducible umbilical hernia noted. No redness. Moderate size.  Neurologic Neurologic evaluation reveals -alert and oriented x 3 with no impairment of recent or remote memory. Mental Status-Normal.  Musculoskeletal Normal Exam - Left-Upper Extremity Strength Normal and Lower Extremity Strength Normal. Normal Exam - Right-Upper Extremity Strength Normal and Lower Extremity Strength Normal.  Lymphatic Head & Neck  General Head & Neck Lymphatics: Bilateral - Description - Normal.    Assessment & Plan (Arvle Grabe A. Shiri Hodapp MD; [id number] PM)  UMBILICAL HERNIA WITHOUT OBSTRUCTION AND WITHOUT GANGRENE  (K42.9) Impression: The risk of hernia repair include bleeding, infection, organ injury, bowel injury, bladder injury, nerve injury recurrent hernia, blood clots, worsening of underlying condition, chronic pain, mesh use, open surgery, death, and the need for other operattions. Pt agrees to proceed  Current Plans You are being scheduled for surgery- Our schedulers will call you.  You should hear from our office's scheduling department within 5 working days about the location, date, and time of surgery. We try to make accommodations for patient's preferences in scheduling surgery, but sometimes the OR schedule or the surgeon's schedule prevents [place] from making those accommodations.  If you have not heard from our office [phone]) in 5 working days, call the office and ask for your surgeon's nurse.  If you have other questions about your diagnosis, plan, or surgery, call the office and ask for your surgeon's nurse.  Pt Education - Pamphlet Given - Hernia Surgery: discussed with patient and provided information. The anatomy & physiology of the abdominal wall was discussed. The pathophysiology of hernias was discussed. Natural history risks without surgery including progeressive enlargement, pain, incarceration, & strangulation was discussed. Contributors to complications such as smoking, obesity, diabetes, prior surgery, etc were discussed.  I feel the risks of no intervention will lead to serious problems that outweigh the operative risks; therefore, I recommended surgery to reduce and repair the hernia. I explained laparoscopic techniques with possible need for an open approach. I noted the probable use of mesh to patch and/or buttress the hernia repair  Risks such as bleeding, infection, abscess, need for further treatment, heart attack, death, and other risks were discussed. I noted a good likelihood this will help address the problem. Goals of post-operative recovery were discussed  as well. Possibility that this will not correct all symptoms was explained. I stressed the importance of low-impact activity, aggressive pain control, avoiding constipation, & not pushing through pain to minimize risk of post-operative chronic pain or injury. Possibility of reherniation especially with smoking, obesity, diabetes, immunosuppression, and other health conditions was discussed. We will work to minimize complications.  An educational handout further explaining the pathology & treatment options was given as well. Questions were answered. The patient expresses understanding & wishes to proceed with surgery.

## 2016-05-26 ENCOUNTER — Encounter (HOSPITAL_BASED_OUTPATIENT_CLINIC_OR_DEPARTMENT_OTHER): Payer: Self-pay | Admitting: *Deleted

## 2016-05-29 ENCOUNTER — Encounter (HOSPITAL_BASED_OUTPATIENT_CLINIC_OR_DEPARTMENT_OTHER)
Admission: RE | Admit: 2016-05-29 | Discharge: 2016-05-29 | Disposition: A | Discharge status: Home / Self Care | Payer: Medicare Other | Source: Ambulatory Visit | Attending: Surgery | Admitting: Surgery

## 2016-05-29 ENCOUNTER — Ambulatory Visit: Payer: Self-pay | Admitting: Surgery

## 2016-05-29 DIAGNOSIS — E669 Obesity, unspecified: Secondary | ICD-10-CM | POA: Diagnosis not present

## 2016-05-29 DIAGNOSIS — Z85820 Personal history of malignant melanoma of skin: Secondary | ICD-10-CM | POA: Diagnosis not present

## 2016-05-29 DIAGNOSIS — I10 Essential (primary) hypertension: Secondary | ICD-10-CM | POA: Diagnosis not present

## 2016-05-29 DIAGNOSIS — F419 Anxiety disorder, unspecified: Secondary | ICD-10-CM | POA: Diagnosis not present

## 2016-05-29 DIAGNOSIS — K429 Umbilical hernia without obstruction or gangrene: Secondary | ICD-10-CM | POA: Diagnosis not present

## 2016-05-29 DIAGNOSIS — Z85850 Personal history of malignant neoplasm of thyroid: Secondary | ICD-10-CM | POA: Diagnosis not present

## 2016-05-29 DIAGNOSIS — Z6833 Body mass index (BMI) 33.0-33.9, adult: Secondary | ICD-10-CM | POA: Diagnosis not present

## 2016-05-29 DIAGNOSIS — Z79899 Other long term (current) drug therapy: Secondary | ICD-10-CM | POA: Diagnosis not present

## 2016-05-29 DIAGNOSIS — E039 Hypothyroidism, unspecified: Secondary | ICD-10-CM | POA: Diagnosis not present

## 2016-05-29 LAB — BASIC METABOLIC PANEL
ANION GAP: 8 (ref 5–15)
BUN: 19 mg/dL (ref 6–20)
CHLORIDE: 102 mmol/L (ref 101–111)
CO2: 27 mmol/L (ref 22–32)
Calcium: 9.8 mg/dL (ref 8.9–10.3)
Creatinine, Ser: 0.82 mg/dL (ref 0.44–1.00)
GFR calc Af Amer: >60 mL/min (ref >60)
Glucose, Bld: 101 mg/dL — ABNORMAL HIGH (ref 65–99)
Potassium: 4 mmol/L (ref 3.5–5.1)
SODIUM: 137 mmol/L (ref 135–145)

## 2016-05-30 ENCOUNTER — Encounter (HOSPITAL_BASED_OUTPATIENT_CLINIC_OR_DEPARTMENT_OTHER)
Admission: RE | Disposition: A | Discharge status: Home / Self Care | Payer: Self-pay | Source: Ambulatory Visit | Attending: Surgery

## 2016-05-30 ENCOUNTER — Ambulatory Visit (HOSPITAL_BASED_OUTPATIENT_CLINIC_OR_DEPARTMENT_OTHER): Payer: Medicare Other | Admitting: Anesthesiology

## 2016-05-30 ENCOUNTER — Encounter (HOSPITAL_BASED_OUTPATIENT_CLINIC_OR_DEPARTMENT_OTHER): Payer: Self-pay | Admitting: Anesthesiology

## 2016-05-30 ENCOUNTER — Ambulatory Visit (HOSPITAL_BASED_OUTPATIENT_CLINIC_OR_DEPARTMENT_OTHER)
Admission: RE | Admit: 2016-05-30 | Discharge: 2016-05-30 | Disposition: A | Discharge status: Home / Self Care | Payer: Medicare Other | Source: Ambulatory Visit | Attending: Surgery | Admitting: Surgery

## 2016-05-30 DIAGNOSIS — E039 Hypothyroidism, unspecified: Secondary | ICD-10-CM | POA: Diagnosis not present

## 2016-05-30 DIAGNOSIS — F419 Anxiety disorder, unspecified: Secondary | ICD-10-CM | POA: Diagnosis not present

## 2016-05-30 DIAGNOSIS — Z79899 Other long term (current) drug therapy: Secondary | ICD-10-CM | POA: Insufficient documentation

## 2016-05-30 DIAGNOSIS — Z6833 Body mass index (BMI) 33.0-33.9, adult: Secondary | ICD-10-CM | POA: Insufficient documentation

## 2016-05-30 DIAGNOSIS — I10 Essential (primary) hypertension: Secondary | ICD-10-CM | POA: Insufficient documentation

## 2016-05-30 DIAGNOSIS — E669 Obesity, unspecified: Secondary | ICD-10-CM | POA: Insufficient documentation

## 2016-05-30 DIAGNOSIS — K429 Umbilical hernia without obstruction or gangrene: Secondary | ICD-10-CM | POA: Diagnosis not present

## 2016-05-30 DIAGNOSIS — Z85820 Personal history of malignant melanoma of skin: Secondary | ICD-10-CM | POA: Diagnosis not present

## 2016-05-30 DIAGNOSIS — Z85850 Personal history of malignant neoplasm of thyroid: Secondary | ICD-10-CM | POA: Diagnosis not present

## 2016-05-30 HISTORY — DX: Hypothyroidism, unspecified: E03.9

## 2016-05-30 HISTORY — DX: Disease of blood and blood-forming organs, unspecified: D75.9

## 2016-05-30 HISTORY — DX: Major depressive disorder, single episode, unspecified: F32.9

## 2016-05-30 HISTORY — DX: Depression, unspecified: F32.A

## 2016-05-30 HISTORY — PX: INSERTION OF MESH: SHX5868

## 2016-05-30 HISTORY — PX: UMBILICAL HERNIA REPAIR: SHX196

## 2016-05-30 HISTORY — DX: Gastro-esophageal reflux disease without esophagitis: K21.9

## 2016-05-30 LAB — POCT HEMOGLOBIN-HEMACUE: HEMOGLOBIN: 13.3 g/dL (ref 12.0–15.0)

## 2016-05-30 SURGERY — REPAIR, HERNIA, UMBILICAL, ADULT
Anesthesia: General | Site: Abdomen

## 2016-05-30 MED ORDER — CELECOXIB 200 MG PO CAPS: 200.0000 mg | ORAL_CAPSULE | Freq: Two times a day (BID) | ORAL | 2 refills | Status: DC

## 2016-05-30 MED ORDER — PROMETHAZINE HCL 25 MG/ML IJ SOLN: 6.2500 mg | INTRAMUSCULAR | Status: DC | PRN

## 2016-05-30 MED ORDER — MEPERIDINE HCL 25 MG/ML IJ SOLN: 6.2500 mg | INTRAMUSCULAR | Status: DC | PRN

## 2016-05-30 MED ORDER — HYDROCODONE-ACETAMINOPHEN 7.5-325 MG PO TABS: 1.0000 | ORAL_TABLET | Freq: Once | ORAL | Status: DC | PRN

## 2016-05-30 MED ORDER — CLINDAMYCIN PHOSPHATE 600 MG/50ML IV SOLN
INTRAVENOUS | Status: AC
  Filled 2016-05-30: qty 50

## 2016-05-30 MED ORDER — CHLORHEXIDINE GLUCONATE CLOTH 2 % EX PADS: 6.0000 | MEDICATED_PAD | Freq: Once | CUTANEOUS | Status: DC

## 2016-05-30 MED ORDER — ENOXAPARIN SODIUM 40 MG/0.4ML ~~LOC~~ SOLN
SUBCUTANEOUS | Status: AC
  Filled 2016-05-30: qty 0.4

## 2016-05-30 MED ORDER — PHENYLEPHRINE HCL 10 MG/ML IJ SOLN
INTRAMUSCULAR | Status: DC | PRN
  Administered 2016-05-30 (×2): 40 ug via INTRAVENOUS

## 2016-05-30 MED ORDER — HYDROCODONE-ACETAMINOPHEN 5-325 MG PO TABS: 1.0000 | ORAL_TABLET | Freq: Four times a day (QID) | ORAL | 0 refills | Status: DC | PRN

## 2016-05-30 MED ORDER — EPHEDRINE SULFATE 50 MG/ML IJ SOLN
INTRAMUSCULAR | Status: DC | PRN
  Administered 2016-05-30: 10 mg via INTRAVENOUS
  Administered 2016-05-30: 20 mg via INTRAVENOUS
  Administered 2016-05-30: 10 mg via INTRAVENOUS

## 2016-05-30 MED ORDER — LIDOCAINE HCL (CARDIAC) 20 MG/ML IV SOLN
INTRAVENOUS | Status: DC | PRN
  Administered 2016-05-30: 60 mg via INTRAVENOUS

## 2016-05-30 MED ORDER — ROCURONIUM BROMIDE 100 MG/10ML IV SOLN
INTRAVENOUS | Status: DC | PRN
  Administered 2016-05-30: 50 mg via INTRAVENOUS

## 2016-05-30 MED ORDER — ENOXAPARIN SODIUM 40 MG/0.4ML ~~LOC~~ SOLN
40.0000 mg | Freq: Once | SUBCUTANEOUS | Status: AC
  Administered 2016-05-30: 40 mg via SUBCUTANEOUS

## 2016-05-30 MED ORDER — PROPOFOL 10 MG/ML IV BOLUS
INTRAVENOUS | Status: DC | PRN
  Administered 2016-05-30: 150 mg via INTRAVENOUS

## 2016-05-30 MED ORDER — KETOROLAC TROMETHAMINE 30 MG/ML IJ SOLN
INTRAMUSCULAR | Status: AC
  Filled 2016-05-30: qty 1

## 2016-05-30 MED ORDER — SCOPOLAMINE 1 MG/3DAYS TD PT72: 1.0000 | MEDICATED_PATCH | Freq: Once | TRANSDERMAL | Status: DC | PRN

## 2016-05-30 MED ORDER — OXYCODONE HCL 5 MG PO TABS
ORAL_TABLET | ORAL | Status: AC
  Filled 2016-05-30: qty 1

## 2016-05-30 MED ORDER — FENTANYL CITRATE (PF) 100 MCG/2ML IJ SOLN
50.0000 ug | INTRAMUSCULAR | Status: DC | PRN
  Administered 2016-05-30: 50 ug via INTRAVENOUS

## 2016-05-30 MED ORDER — KETOROLAC TROMETHAMINE 15 MG/ML IJ SOLN
15.0000 mg | Freq: Once | INTRAMUSCULAR | Status: AC
  Administered 2016-05-30: 15 mg via INTRAVENOUS

## 2016-05-30 MED ORDER — BUPIVACAINE HCL (PF) 0.25 % IJ SOLN
INTRAMUSCULAR | Status: DC | PRN
  Administered 2016-05-30: 10 mL

## 2016-05-30 MED ORDER — LACTATED RINGERS IV SOLN
INTRAVENOUS | Status: DC
  Administered 2016-05-30 (×2): via INTRAVENOUS

## 2016-05-30 MED ORDER — MIDAZOLAM HCL 2 MG/2ML IJ SOLN: 1.0000 mg | INTRAMUSCULAR | Status: DC | PRN

## 2016-05-30 MED ORDER — ONDANSETRON HCL 4 MG/2ML IJ SOLN
INTRAMUSCULAR | Status: DC | PRN
  Administered 2016-05-30: 4 mg via INTRAVENOUS

## 2016-05-30 MED ORDER — ACETAMINOPHEN 10 MG/ML IV SOLN
INTRAVENOUS | Status: AC
  Filled 2016-05-30: qty 100

## 2016-05-30 MED ORDER — PHENYLEPHRINE HCL 10 MG/ML IJ SOLN
INTRAMUSCULAR | Status: DC | PRN
  Administered 2016-05-30: 40 ug/min via INTRAVENOUS

## 2016-05-30 MED ORDER — FENTANYL CITRATE (PF) 100 MCG/2ML IJ SOLN
INTRAMUSCULAR | Status: AC
  Filled 2016-05-30: qty 2

## 2016-05-30 MED ORDER — CLINDAMYCIN PHOSPHATE 600 MG/50ML IV SOLN
600.0000 mg | Freq: Once | INTRAVENOUS | Status: AC
  Administered 2016-05-30: 900 mg via INTRAVENOUS

## 2016-05-30 MED ORDER — SUGAMMADEX SODIUM 500 MG/5ML IV SOLN
INTRAVENOUS | Status: DC | PRN
  Administered 2016-05-30: 300 mg via INTRAVENOUS

## 2016-05-30 MED ORDER — HYDROMORPHONE HCL 1 MG/ML IJ SOLN
0.2500 mg | INTRAMUSCULAR | Status: DC | PRN
  Administered 2016-05-30 (×4): 0.25 mg via INTRAVENOUS

## 2016-05-30 MED ORDER — ACETAMINOPHEN 10 MG/ML IV SOLN
INTRAVENOUS | Status: DC | PRN
  Administered 2016-05-30: 1000 mg via INTRAVENOUS

## 2016-05-30 MED ORDER — PROPOFOL 10 MG/ML IV BOLUS
INTRAVENOUS | Status: AC
  Filled 2016-05-30: qty 20

## 2016-05-30 MED ORDER — HYDROMORPHONE HCL 1 MG/ML IJ SOLN
INTRAMUSCULAR | Status: AC
  Filled 2016-05-30: qty 1

## 2016-05-30 MED ORDER — DEXAMETHASONE SODIUM PHOSPHATE 4 MG/ML IJ SOLN
INTRAMUSCULAR | Status: DC | PRN
  Administered 2016-05-30: 6 mg via INTRAVENOUS

## 2016-05-30 SURGICAL SUPPLY — 48 items
BENZOIN TINCTURE PRP APPL 2/3 (GAUZE/BANDAGES/DRESSINGS) IMPLANT
BLADE CLIPPER SURG (BLADE) IMPLANT
BLADE SURG 10 STRL SS (BLADE) IMPLANT
BLADE SURG 15 STRL LF DISP TIS (BLADE) ×1 IMPLANT
BLADE SURG 15 STRL SS (BLADE) ×1
CANISTER SUCT 1200ML W/VALVE (MISCELLANEOUS) ×2 IMPLANT
CHLORAPREP W/TINT 26ML (MISCELLANEOUS) ×2 IMPLANT
COTTONBALL LRG STERILE PKG (GAUZE/BANDAGES/DRESSINGS) ×2 IMPLANT
COVER BACK TABLE 60X90IN (DRAPES) ×2 IMPLANT
COVER MAYO STAND STRL (DRAPES) ×2 IMPLANT
DECANTER SPIKE VIAL GLASS SM (MISCELLANEOUS) IMPLANT
DERMABOND ADVANCED (GAUZE/BANDAGES/DRESSINGS) ×1
DERMABOND ADVANCED .7 DNX12 (GAUZE/BANDAGES/DRESSINGS) ×1 IMPLANT
DRAPE LAPAROTOMY TRNSV 102X78 (DRAPE) ×2 IMPLANT
DRAPE UTILITY XL STRL (DRAPES) ×2 IMPLANT
DRSG TEGADERM 4X4.75 (GAUZE/BANDAGES/DRESSINGS) IMPLANT
ELECT COATED BLADE 2.86 ST (ELECTRODE) ×2 IMPLANT
ELECT REM PT RETURN 9FT ADLT (ELECTROSURGICAL) ×2
ELECTRODE REM PT RTRN 9FT ADLT (ELECTROSURGICAL) ×1 IMPLANT
GLOVE BIOGEL PI IND STRL 7.0 (GLOVE) ×1 IMPLANT
GLOVE BIOGEL PI IND STRL 8 (GLOVE) ×1 IMPLANT
GLOVE BIOGEL PI INDICATOR 7.0 (GLOVE) ×1
GLOVE BIOGEL PI INDICATOR 8 (GLOVE) ×1
GLOVE ECLIPSE 6.5 STRL STRAW (GLOVE) ×2 IMPLANT
GLOVE ECLIPSE 8.0 STRL XLNG CF (GLOVE) ×2 IMPLANT
GOWN STRL REUS W/ TWL LRG LVL3 (GOWN DISPOSABLE) ×2 IMPLANT
GOWN STRL REUS W/TWL LRG LVL3 (GOWN DISPOSABLE) ×2
MESH VENTRALEX ST 2.5 CRC MED (Mesh General) ×2 IMPLANT
NEEDLE HYPO 22GX1.5 SAFETY (NEEDLE) IMPLANT
NEEDLE HYPO 25X1 1.5 SAFETY (NEEDLE) ×2 IMPLANT
NS IRRIG 1000ML POUR BTL (IV SOLUTION) ×2 IMPLANT
PACK BASIN DAY SURGERY FS (CUSTOM PROCEDURE TRAY) ×2 IMPLANT
PENCIL BUTTON HOLSTER BLD 10FT (ELECTRODE) ×2 IMPLANT
SLEEVE SCD COMPRESS KNEE MED (MISCELLANEOUS) ×2 IMPLANT
STAPLER VISISTAT 35W (STAPLE) IMPLANT
STRIP CLOSURE SKIN 1/2X4 (GAUZE/BANDAGES/DRESSINGS) IMPLANT
SUT MON AB 4-0 PC3 18 (SUTURE) ×2 IMPLANT
SUT NOVA NAB DX-16 0-1 5-0 T12 (SUTURE) ×2 IMPLANT
SUT NOVA NAB GS-22 2 0 T19 (SUTURE) IMPLANT
SUT SILK 3 0 SH 30 (SUTURE) IMPLANT
SUT VIC AB 2-0 SH 27 (SUTURE) ×1
SUT VIC AB 2-0 SH 27XBRD (SUTURE) ×1 IMPLANT
SUT VICRYL 3-0 CR8 SH (SUTURE) ×2 IMPLANT
SYR CONTROL 10ML LL (SYRINGE) ×2 IMPLANT
TOWEL OR 17X24 6PK STRL BLUE (TOWEL DISPOSABLE) ×2 IMPLANT
TOWEL OR NON WOVEN STRL DISP B (DISPOSABLE) ×2 IMPLANT
TUBE CONNECTING 20X1/4 (TUBING) ×2 IMPLANT
YANKAUER SUCT BULB TIP NO VENT (SUCTIONS) IMPLANT

## 2016-05-30 NOTE — Op Note (Signed)
Preoperative diagnosis: Umbilical hernia reducible  Postoperative diagnosis: Same  Procedure: Repair of umbilical hernia with mesh  Surgeon: Erroll Luna M.D.  Anesthesia: Gen. with 0.25% Sensorcaine plain local  EBL: Minimal  Specimen: None  Indications for procedure: The patient's a 71 year old female with a symptomatic umbilical hernia. She presents today for repair after discussion of operative and nonoperative management. Risks, benefits and the  alternatives to surgery were discussed.The risk of hernia repair include bleeding,  Infection,   Recurrence of the hernia,  Mesh use, chronic pain,  Organ injury,  Bowel injury,  Bladder injury,   nerve injury with numbness around the incision,  Death,  and worsening of preexisting  medical problems.  The alternatives to surgery have been discussed as well..  Long term expectations of both operative and non operative treatments have been discussed.   The patient agrees to proceed.   Description of procedure: The patient was met in the holding area and questions are answered. She was taken back to the operating room and placed upon the OR table. After induction of general anesthesia, the abdomen was prepped and draped in a sterile fashion. Timeout was done to verify proper procedure and patient. Local anesthetic was infiltrated along the inferior border of the umbilicus. Curvilinear incision was made along the umbilicus and the umbilical skin was dissected off the hernia sac. The hernia was approximately 3 cm in maximal diameter. There is no incarcerated contents within it. I dissected the excess hernia sac from the edges of the fascia to clean these up. Once the fascial edges were identified they were grasped with Coker clamps. The peritoneum was dissected off the undersurface of the fascia. This was done circumflex Geralynn Ochs for approximately 2 cm in all directions. I then closed the peritoneum with 2-0 Vicryl. A 6 cm circular ventral light  umbilical mesh was used and placed in a subfascial position. It was secured to the edge of the fascia with interrupted #1 Novafil suture. Hemostasis was achieved. The fascia was closed over this with #1 Novafil suture. The subcutaneous tissues were closed with 201 3-0 Vicryl. 4 Monocryl was used to close the skin is cuticular fashion. All final counts are found to be correct. Patient was in awoke extubated taken to recovery in satisfactory condition.

## 2016-05-30 NOTE — Anesthesia Postprocedure Evaluation (Addendum)
Anesthesia Post Note  Patient: Elizabeth Mcclain  Procedure(s) Performed: Procedure(s) (LRB): UMBILICAL HERNIA REPAIR (N/A) INSERTION OF MESH (N/A)  Patient location during evaluation: PACU Anesthesia Type: General Level of consciousness: sedated and patient cooperative Pain management: pain level controlled Vital Signs Assessment: post-procedure vital signs reviewed and stable Respiratory status: spontaneous breathing Cardiovascular status: stable Anesthetic complications: no Comments: Pt with substantial periumbilical pain in PACU. Given toradol, IV tylenol and 1mg  of hydromorphone. Pt very sleepy and beginning to hypoventilate. Gave patient the option to have Dr. Brantley Stage keep her overnight in [place] or try to get up and dressed to see if she could be comfortable enough at home. She eventually elected to try to go home.        Last Vitals:  Vitals:   05/30/16 1715 05/30/16 1730  BP: 123/63 112/64  Pulse: 94 94  Resp: 15 15  Temp:      Last Pain:  Vitals:   05/30/16 1730  TempSrc:   PainSc: [place]

## 2016-05-30 NOTE — Anesthesia Procedure Notes (Signed)
Procedure Name: Intubation Date/Time: 05/30/2016 3:05 PM Performed by: Quynn Vilchis D Pre-anesthesia Checklist: Patient identified, Emergency Drugs available, Suction available and Patient being monitored Patient Re-evaluated:Patient Re-evaluated prior to inductionOxygen Delivery Method: Circle system utilized Preoxygenation: Pre-oxygenation with 100% oxygen Intubation Type: IV induction Ventilation: Mask ventilation without difficulty Laryngoscope Size: Mac and 3 Grade View: Grade I Tube type: Oral Tube size: 7.0 mm Number of attempts: 1 Airway Equipment and Method: Stylet and Oral airway Placement Confirmation: ETT inserted through vocal cords under direct vision,  positive ETCO2 and breath sounds checked- equal and bilateral Secured at: 21 cm Tube secured with: Tape Dental Injury: Teeth and Oropharynx as per pre-operative assessment

## 2016-05-30 NOTE — H&P (View-Only) (Signed)
Elizabeth Mcclain 05/01/2016 11:06 AM Location: [facility] Patient #: [id number] DOB: 12/11/1945 Married / Language: English / Race: White Female  History of Present Illness [name] A. Janasha Barkalow MD; 05/01/2016 2:11 PM) Patient words: hernia    The patient was sent at the request of Dr. Nicki Reaper for umbilical hernia. She has had this for one year. It is getting larger. It is causing mild to moderate discomfort with straining. Denies any history of nausea or vomiting with this. No history of incarceration noted. There's been no redness or drainage from the site.  The patient is a 71 year old female.   Past Surgical History [name], CMA; 05/01/2016 11:06 AM) Breast Biopsy Left. Hysterectomy (not due to cancer) - Partial Knee Surgery Bilateral. Shoulder Surgery Bilateral.  Diagnostic Studies History [name], CMA; 05/01/2016 11:06 AM) Colonoscopy 5-10 years ago Mammogram within last year  Allergies [name], CMA; 05/01/2016 11:07 AM) Percocet *ANALGESICS - OPIOID* Penicillin G Pot in Dextrose *PENICILLINS* Rash.  Medication History [name], CMA; 05/01/2016 11:08 AM) Losartan Potassium (100MG  Tablet, Oral) Active. ALPRAZolam (0.25MG  Tablet, Oral) Active. HydroCHLOROthiazide (25MG  Tablet, Oral) Active. PARoxetine HCl (40MG  Tablet, Oral) Active. Levothyroxine Sodium (25MCG Tablet, Oral) Active. Vitamin D2 (Oral) Specific strength unknown - Active. Medications Reconciled  Social History [name], CMA; 05/01/2016 11:06 AM) Caffeine use Carbonated beverages, Tea. No alcohol use No drug use Tobacco use Never smoker.  Family History Sharyn Lull R. Rolena Infante, CMA; 05/01/2016 11:06 AM) Arthritis Mother. Bleeding disorder Sister. Breast Cancer Mother. Cancer Family Members In General. Cerebrovascular Accident Brother. Heart disease in female family member before age 40 Hypertension  Father. Kidney Disease Mother, Sister. Respiratory Condition Father. Thyroid problems Father.  Pregnancy / Birth History Sharyn Lull R. Rolena Infante, CMA; 05/01/2016 11:06 AM) Age at menarche 89 years. Para 2  Other Problems ([name], CMA; 05/01/2016 11:06 AM) Anxiety Disorder Arthritis Bladder Problems High blood pressure Melanoma Thyroid Cancer Thyroid Disease     Review of Systems [facility] R. Brooks CMA; 05/01/2016 11:06 AM) General Not Present- Appetite Loss, Chills, Fatigue, Fever, Night Sweats, Weight Gain and Weight Loss. Skin Not Present- Change in Wart/Mole, Dryness, Hives, Jaundice, New Lesions, Non-Healing Wounds, Rash and Ulcer. HEENT Not Present- Earache, Hearing Loss, Hoarseness, Nose Bleed, Oral Ulcers, Ringing in the Ears, Seasonal Allergies, Sinus Pain, Sore Throat, Visual Disturbances, Wears glasses/contact lenses and Yellow Eyes. Respiratory Not Present- Bloody sputum, Chronic Cough, Difficulty Breathing, Snoring and Wheezing. Breast Not Present- Breast Mass, Breast Pain, Nipple Discharge and Skin Changes. Cardiovascular Not Present- Chest Pain, Difficulty Breathing Lying Down, Leg Cramps, Palpitations, Rapid Heart Rate, Shortness of Breath and Swelling of Extremities. Gastrointestinal Present- Abdominal Pain. Not Present- Bloating, Bloody Stool, Change in Bowel Habits, Chronic diarrhea, Constipation, Difficulty Swallowing, Excessive gas, Gets full quickly at meals, Hemorrhoids, Indigestion, Nausea, Rectal Pain and Vomiting. Female Genitourinary Present- Urgency. Not Present- Frequency, Nocturia, Painful Urination and Pelvic Pain. Musculoskeletal Not Present- Back Pain, Joint Pain, Joint Stiffness, Muscle Pain, Muscle Weakness and Swelling of Extremities. Neurological Present- Tremor. Not Present- Decreased Memory, Fainting, Headaches, Numbness, Seizures, Tingling, Trouble walking and Weakness. Psychiatric Present- Anxiety. Not Present- Bipolar,  Change in Sleep Pattern, Depression, Fearful and Frequent crying. Endocrine Present- Hair Changes. Not Present- Cold Intolerance, Excessive Hunger, Heat Intolerance, Hot flashes and New Diabetes. Hematology Present- Easy Bruising. Not Present- Blood Thinners, Excessive bleeding, Gland problems, HIV and Persistent Infections.  Vitals [employer] R. Brooks CMA; 05/01/2016 11:06 AM) 05/01/2016 11:06 AM Weight: 160.38 lb Height: 60in  Body Surface Area: 1.7 m Body Mass Index: 31.32 kg/m  BP: 132/86 (Sitting, Left Arm, Standard)      Physical Exam (Nandan Willems A. Drayven Marchena MD; 05/01/2016 2:11 PM)  General Mental Status-Alert. General Appearance-Consistent with stated age. Hydration-Well hydrated. Voice-Normal.  Head and Neck Head-normocephalic, atraumatic with no lesions or palpable masses. Trachea-midline. Thyroid Gland Characteristics - normal size and consistency.  Eye Eyeball - Bilateral-Extraocular movements intact. Sclera/Conjunctiva - Bilateral-No scleral icterus.  Chest and Lung Exam Chest and lung exam reveals -quiet, even and easy respiratory effort with no use of accessory muscles and on auscultation, normal breath sounds, no adventitious sounds and normal vocal resonance. Inspection Chest Wall - Normal. Back - normal.  Abdomen Note: Reducible umbilical hernia noted. No redness. Moderate size.  Neurologic Neurologic evaluation reveals -alert and oriented x 3 with no impairment of recent or remote memory. Mental Status-Normal.  Musculoskeletal Normal Exam - Left-Upper Extremity Strength Normal and Lower Extremity Strength Normal. Normal Exam - Right-Upper Extremity Strength Normal and Lower Extremity Strength Normal.  Lymphatic Head & Neck  General Head & Neck Lymphatics: Bilateral - Description - Normal.    Assessment & Plan (Kevona Lupinacci A. Hulan Szumski MD; [id number] PM)  UMBILICAL HERNIA WITHOUT OBSTRUCTION AND WITHOUT GANGRENE  (K42.9) Impression: The risk of hernia repair include bleeding, infection, organ injury, bowel injury, bladder injury, nerve injury recurrent hernia, blood clots, worsening of underlying condition, chronic pain, mesh use, open surgery, death, and the need for other operattions. Pt agrees to proceed  Current Plans You are being scheduled for surgery- Our schedulers will call you.  You should hear from our office's scheduling department within 5 working days about the location, date, and time of surgery. We try to make accommodations for patient's preferences in scheduling surgery, but sometimes the OR schedule or the surgeon's schedule prevents [place] from making those accommodations.  If you have not heard from our office [phone]) in 5 working days, call the office and ask for your surgeon's nurse.  If you have other questions about your diagnosis, plan, or surgery, call the office and ask for your surgeon's nurse.  Pt Education - Pamphlet Given - Hernia Surgery: discussed with patient and provided information. The anatomy & physiology of the abdominal wall was discussed. The pathophysiology of hernias was discussed. Natural history risks without surgery including progeressive enlargement, pain, incarceration, & strangulation was discussed. Contributors to complications such as smoking, obesity, diabetes, prior surgery, etc were discussed.  I feel the risks of no intervention will lead to serious problems that outweigh the operative risks; therefore, I recommended surgery to reduce and repair the hernia. I explained laparoscopic techniques with possible need for an open approach. I noted the probable use of mesh to patch and/or buttress the hernia repair  Risks such as bleeding, infection, abscess, need for further treatment, heart attack, death, and other risks were discussed. I noted a good likelihood this will help address the problem. Goals of post-operative recovery were discussed  as well. Possibility that this will not correct all symptoms was explained. I stressed the importance of low-impact activity, aggressive pain control, avoiding constipation, & not pushing through pain to minimize risk of post-operative chronic pain or injury. Possibility of reherniation especially with smoking, obesity, diabetes, immunosuppression, and other health conditions was discussed. We will work to minimize complications.  An educational handout further explaining the pathology & treatment options was given as well. Questions were answered. The patient expresses understanding & wishes to proceed with surgery.

## 2016-05-30 NOTE — Anesthesia Preprocedure Evaluation (Signed)
Anesthesia Evaluation  Patient identified by MRN, date of birth, ID band Patient awake    Reviewed: Allergy & Precautions, NPO status , Patient's Chart, lab work & pertinent test results  Airway Mallampati: II  TM Distance: >3 FB Neck ROM: Full    Dental no notable dental hx.    Pulmonary neg pulmonary ROS,    Pulmonary exam normal breath sounds clear to auscultation       Cardiovascular hypertension, Pt. on medications Normal cardiovascular exam Rhythm:Regular Rate:Normal     Neuro/Psych PSYCHIATRIC DISORDERS Anxiety Depression negative neurological ROS     GI/Hepatic Neg liver ROS, GERD  ,  Endo/Other  negative endocrine ROSHypothyroidism   Renal/GU negative Renal ROS     Musculoskeletal negative musculoskeletal ROS (+)   Abdominal (+) + obese,   Peds  Hematology negative hematology ROS (+)   Anesthesia Other Findings   Reproductive/Obstetrics negative OB ROS                             Anesthesia Physical Anesthesia Plan  ASA: II  Anesthesia Plan: General   Post-op Pain Management:    Induction: Intravenous  Airway Management Planned: LMA and Oral ETT  Additional Equipment:   Intra-op Plan:   Post-operative Plan: Extubation in OR  Informed Consent: I have reviewed the patients History and Physical, chart, labs and discussed the procedure including the risks, benefits and alternatives for the proposed anesthesia with the patient or authorized representative who has indicated his/her understanding and acceptance.   Dental advisory given  Plan Discussed with: CRNA  Anesthesia Plan Comments:         Anesthesia Quick Evaluation

## 2016-05-30 NOTE — Interval H&P Note (Signed)
History and Physical Interval Note:  05/30/2016 2:45 PM  Elizabeth Mcclain  has presented today for surgery, with the diagnosis of umbilical hernia  The various methods of treatment have been discussed with the patient and family. After consideration of risks, benefits and other options for treatment, the patient has consented to  Procedure(s): UMBILICAL HERNIA REPAIR (N/A) INSERTION OF MESH (N/A) as a surgical intervention .  The patient's history has been reviewed, patient examined, no change in status, stable for surgery.  I have reviewed the patient's chart and labs.  Questions were answered to the patient's satisfaction.     Jermari Tamargo A.

## 2016-05-30 NOTE — Discharge Instructions (Signed)
[facility] _______[facility] ° °UMBILICAL OR INGUINAL HERNIA REPAIR: POST OP INSTRUCTIONS ° °Always review your discharge instruction sheet given to you by the facility where your surgery was performed. °IF YOU HAVE DISABILITY OR FAMILY LEAVE FORMS, YOU MUST BRING THEM TO THE OFFICE FOR PROCESSING.   °DO NOT GIVE THEM TO YOUR DOCTOR. ° °1. A  prescription for pain medication may be given to you upon discharge.  Take your pain medication as prescribed, if needed.  If narcotic pain medicine is not needed, then you may take acetaminophen (Tylenol) or ibuprofen (Advil) as needed. °2. Take your usually prescribed medications unless otherwise directed. °If you need a refill on your pain medication, please contact your pharmacy.  They will contact our office to request authorization. Prescriptions will not be filled after 5 pm or on week-ends. °3. You should follow a light diet the first 24 hours after arrival home, such as soup and crackers, etc.  Be sure to include lots of fluids daily.  Resume your normal diet the day after surgery. °4.Most patients will experience some swelling and bruising around the umbilicus or in the groin and scrotum.  Ice packs and reclining will help.  Swelling and bruising can take several days to resolve.  °6. It is common to experience some constipation if taking pain medication after surgery.  Increasing fluid intake and taking a stool softener (such as Colace) will usually help or prevent this problem from occurring.  A mild laxative (Milk of Magnesia or Miralax) should be taken according to package directions if there are no bowel movements after 48 hours. °7. Unless discharge instructions indicate otherwise, you may remove your bandages 24-48 hours after surgery, and you may shower at that time.  You may have steri-strips (small skin tapes) in place directly over the incision.  These strips should be left on the skin for 7-10 days.  If your surgeon used skin glue on the  incision, you may shower in 24 hours.  The glue will flake off over the next 2-3 weeks.  Any sutures or staples will be removed at the office during your follow-up visit. °8. ACTIVITIES:  You may resume regular (light) daily activities beginning the next day--such as daily self-care, walking, climbing stairs--gradually increasing activities as tolerated.  You may have sexual intercourse when it is comfortable.  Refrain from any heavy lifting or straining until approved by your doctor. ° °a.You may drive when you are no longer taking prescription pain medication, you can comfortably wear a seatbelt, and you can safely maneuver your car and apply brakes. °b.RETURN TO WORK:   °_____________________________________________ ° °9.You should see your doctor in the office for a follow-up appointment approximately 2-3 weeks after your surgery.  Make sure that you call for this appointment within a day or two after you arrive home to insure a convenient appointment time. °10.OTHER INSTRUCTIONS: _________________________ °   _____________________________________ ° °WHEN TO CALL YOUR DOCTOR: °1. Fever over 101.0 °2. Inability to urinate °3. Nausea and/or vomiting °4. Extreme swelling or bruising °5. Continued bleeding from incision. °6. Increased pain, redness, or drainage from the incision ° °The clinic staff is available to answer your questions during regular business hours.  Please don’t hesitate to call and ask to speak to one of the nurses for clinical concerns.  If you have a medical emergency, go to the nearest emergency room or call 911.  A surgeon from [facility] is always on call at the hospital ° ° °  [address], [place], [place], [place]  [address] ?  [address], [place], [place]   [address] [phone] ? [phone] ? FAX [phone] Web site: [email or web address]    Remove dressing in 48 hours and shower   Post Anesthesia Home Care Instructions  Activity: Get  plenty of rest for the remainder of the day. A responsible adult should stay with you for 24 hours following the procedure.  For the next 24 hours, DO NOT: -Drive a car -Paediatric nurse -Drink alcoholic beverages -Take any medication unless instructed by your physician -Make any legal decisions or sign important papers.  Meals: Start with liquid foods such as gelatin or soup. Progress to regular foods as tolerated. Avoid greasy, spicy, heavy foods. If nausea and/or vomiting occur, drink only clear liquids until the nausea and/or vomiting subsides. Call your physician if vomiting continues.  Special Instructions/Symptoms: Your throat may feel dry or sore from the anesthesia or the breathing tube placed in your throat during surgery. If this causes discomfort, gargle with warm salt water. The discomfort should disappear within 24 hours.  If you had a scopolamine patch placed behind your ear for the management of post- operative nausea and/or vomiting:  1. The medication in the patch is effective for 72 hours, after which it should be removed.  Wrap patch in a tissue and discard in the trash. Wash hands thoroughly with soap and water. 2. You may remove the patch earlier than 72 hours if you experience unpleasant side effects which may include dry mouth, dizziness or visual disturbances. 3. Avoid touching the patch. Wash your hands with soap and water after contact with the patch.       Recover with band aid and change daily

## 2016-05-30 NOTE — Transfer of Care (Signed)
Immediate Anesthesia Transfer of Care Note  Patient: Elizabeth Mcclain  Procedure(s) Performed: Procedure(s): UMBILICAL HERNIA REPAIR (N/A) INSERTION OF MESH (N/A)  Patient Location: PACU  Anesthesia Type:General  Level of Consciousness: awake and patient cooperative  Airway & Oxygen Therapy: Patient Spontanous Breathing and Patient connected to face mask oxygen  Post-op Assessment: Report given to RN and Post -op Vital signs reviewed and stable  Post vital signs: Reviewed and stable  Last Vitals:  Vitals:   05/30/16 1423  BP: (!) 140/107  Pulse: 85  Resp: 18  Temp: 36.7 C    Last Pain:  Vitals:   05/30/16 1423  TempSrc: Oral         Complications: No apparent anesthesia complications

## 2016-05-31 ENCOUNTER — Encounter (HOSPITAL_BASED_OUTPATIENT_CLINIC_OR_DEPARTMENT_OTHER): Payer: Self-pay | Admitting: Surgery

## 2016-06-15 DIAGNOSIS — R197 Diarrhea, unspecified: Secondary | ICD-10-CM | POA: Diagnosis not present

## 2016-06-15 DIAGNOSIS — R1110 Vomiting, unspecified: Secondary | ICD-10-CM | POA: Diagnosis not present

## 2016-06-15 DIAGNOSIS — R112 Nausea with vomiting, unspecified: Secondary | ICD-10-CM | POA: Diagnosis not present

## 2016-06-15 DIAGNOSIS — R109 Unspecified abdominal pain: Secondary | ICD-10-CM | POA: Diagnosis not present

## 2016-06-15 DIAGNOSIS — R1084 Generalized abdominal pain: Secondary | ICD-10-CM | POA: Diagnosis not present

## 2016-06-23 DIAGNOSIS — L501 Idiopathic urticaria: Secondary | ICD-10-CM | POA: Diagnosis not present

## 2016-06-23 DIAGNOSIS — L281 Prurigo nodularis: Secondary | ICD-10-CM | POA: Diagnosis not present

## 2016-09-26 DIAGNOSIS — Z1389 Encounter for screening for other disorder: Secondary | ICD-10-CM | POA: Diagnosis not present

## 2016-09-26 DIAGNOSIS — I10 Essential (primary) hypertension: Secondary | ICD-10-CM | POA: Diagnosis not present

## 2016-09-26 DIAGNOSIS — Z0000 Encounter for general adult medical examination without abnormal findings: Secondary | ICD-10-CM | POA: Diagnosis not present

## 2016-09-26 DIAGNOSIS — M545 Low back pain: Secondary | ICD-10-CM | POA: Diagnosis not present

## 2016-10-19 DIAGNOSIS — M79605 Pain in left leg: Secondary | ICD-10-CM | POA: Diagnosis not present

## 2016-10-19 DIAGNOSIS — M79662 Pain in left lower leg: Secondary | ICD-10-CM | POA: Diagnosis not present

## 2016-10-23 DIAGNOSIS — M1712 Unilateral primary osteoarthritis, left knee: Secondary | ICD-10-CM | POA: Diagnosis not present

## 2017-01-23 DIAGNOSIS — R233 Spontaneous ecchymoses: Secondary | ICD-10-CM | POA: Diagnosis not present

## 2017-02-20 DIAGNOSIS — G244 Idiopathic orofacial dystonia: Secondary | ICD-10-CM | POA: Diagnosis not present

## 2017-02-20 DIAGNOSIS — R251 Tremor, unspecified: Secondary | ICD-10-CM | POA: Diagnosis not present

## 2017-02-20 DIAGNOSIS — G243 Spasmodic torticollis: Secondary | ICD-10-CM | POA: Diagnosis not present

## 2017-02-20 DIAGNOSIS — R252 Cramp and spasm: Secondary | ICD-10-CM | POA: Diagnosis not present

## 2017-03-17 DIAGNOSIS — J209 Acute bronchitis, unspecified: Secondary | ICD-10-CM | POA: Diagnosis not present

## 2017-04-05 DIAGNOSIS — G243 Spasmodic torticollis: Secondary | ICD-10-CM | POA: Diagnosis not present

## 2017-04-05 DIAGNOSIS — G244 Idiopathic orofacial dystonia: Secondary | ICD-10-CM | POA: Diagnosis not present

## 2017-05-04 DIAGNOSIS — Z1231 Encounter for screening mammogram for malignant neoplasm of breast: Secondary | ICD-10-CM | POA: Diagnosis not present

## 2017-05-04 DIAGNOSIS — Z803 Family history of malignant neoplasm of breast: Secondary | ICD-10-CM | POA: Diagnosis not present

## 2017-05-28 DIAGNOSIS — K8020 Calculus of gallbladder without cholecystitis without obstruction: Secondary | ICD-10-CM | POA: Diagnosis not present

## 2017-05-28 DIAGNOSIS — E039 Hypothyroidism, unspecified: Secondary | ICD-10-CM | POA: Diagnosis not present

## 2017-05-28 DIAGNOSIS — N179 Acute kidney failure, unspecified: Secondary | ICD-10-CM | POA: Diagnosis not present

## 2017-05-28 DIAGNOSIS — R0789 Other chest pain: Secondary | ICD-10-CM | POA: Diagnosis not present

## 2017-05-28 DIAGNOSIS — I10 Essential (primary) hypertension: Secondary | ICD-10-CM | POA: Diagnosis not present

## 2017-05-28 DIAGNOSIS — K8012 Calculus of gallbladder with acute and chronic cholecystitis without obstruction: Secondary | ICD-10-CM | POA: Diagnosis not present

## 2017-05-28 DIAGNOSIS — R112 Nausea with vomiting, unspecified: Secondary | ICD-10-CM | POA: Diagnosis not present

## 2017-05-28 DIAGNOSIS — D6851 Activated protein C resistance: Secondary | ICD-10-CM | POA: Diagnosis not present

## 2017-05-28 DIAGNOSIS — Z79899 Other long term (current) drug therapy: Secondary | ICD-10-CM | POA: Diagnosis not present

## 2017-05-28 DIAGNOSIS — K810 Acute cholecystitis: Secondary | ICD-10-CM | POA: Diagnosis not present

## 2017-05-28 DIAGNOSIS — R079 Chest pain, unspecified: Secondary | ICD-10-CM | POA: Diagnosis not present

## 2017-05-28 DIAGNOSIS — K8000 Calculus of gallbladder with acute cholecystitis without obstruction: Secondary | ICD-10-CM | POA: Diagnosis not present

## 2017-06-06 DIAGNOSIS — K219 Gastro-esophageal reflux disease without esophagitis: Secondary | ICD-10-CM | POA: Diagnosis not present

## 2017-06-06 DIAGNOSIS — Z1339 Encounter for screening examination for other mental health and behavioral disorders: Secondary | ICD-10-CM | POA: Diagnosis not present

## 2017-06-06 DIAGNOSIS — Z9181 History of falling: Secondary | ICD-10-CM | POA: Diagnosis not present

## 2017-06-15 DIAGNOSIS — Z09 Encounter for follow-up examination after completed treatment for conditions other than malignant neoplasm: Secondary | ICD-10-CM | POA: Diagnosis not present

## 2017-08-09 DIAGNOSIS — G243 Spasmodic torticollis: Secondary | ICD-10-CM | POA: Diagnosis not present

## 2017-08-09 DIAGNOSIS — G244 Idiopathic orofacial dystonia: Secondary | ICD-10-CM | POA: Diagnosis not present

## 2017-08-27 DIAGNOSIS — I10 Essential (primary) hypertension: Secondary | ICD-10-CM | POA: Diagnosis not present

## 2017-08-27 DIAGNOSIS — R5383 Other fatigue: Secondary | ICD-10-CM | POA: Diagnosis not present

## 2017-08-29 DIAGNOSIS — Z79899 Other long term (current) drug therapy: Secondary | ICD-10-CM | POA: Diagnosis not present

## 2017-08-29 DIAGNOSIS — I10 Essential (primary) hypertension: Secondary | ICD-10-CM | POA: Diagnosis not present

## 2017-08-29 DIAGNOSIS — D519 Vitamin B12 deficiency anemia, unspecified: Secondary | ICD-10-CM | POA: Diagnosis not present

## 2017-08-29 DIAGNOSIS — E785 Hyperlipidemia, unspecified: Secondary | ICD-10-CM | POA: Diagnosis not present

## 2017-09-12 DIAGNOSIS — M1712 Unilateral primary osteoarthritis, left knee: Secondary | ICD-10-CM | POA: Insufficient documentation

## 2017-11-09 DIAGNOSIS — L570 Actinic keratosis: Secondary | ICD-10-CM | POA: Diagnosis not present

## 2017-11-09 DIAGNOSIS — C4359 Malignant melanoma of other part of trunk: Secondary | ICD-10-CM | POA: Diagnosis not present

## 2017-11-09 DIAGNOSIS — Z85820 Personal history of malignant melanoma of skin: Secondary | ICD-10-CM | POA: Diagnosis not present

## 2017-12-03 DIAGNOSIS — R1900 Intra-abdominal and pelvic swelling, mass and lump, unspecified site: Secondary | ICD-10-CM | POA: Diagnosis not present

## 2017-12-05 DIAGNOSIS — K449 Diaphragmatic hernia without obstruction or gangrene: Secondary | ICD-10-CM | POA: Diagnosis not present

## 2017-12-05 DIAGNOSIS — Z9889 Other specified postprocedural states: Secondary | ICD-10-CM | POA: Diagnosis not present

## 2017-12-05 DIAGNOSIS — R10819 Abdominal tenderness, unspecified site: Secondary | ICD-10-CM | POA: Diagnosis not present

## 2017-12-17 DIAGNOSIS — Z79899 Other long term (current) drug therapy: Secondary | ICD-10-CM | POA: Diagnosis not present

## 2017-12-17 DIAGNOSIS — N8111 Cystocele, midline: Secondary | ICD-10-CM | POA: Diagnosis not present

## 2017-12-17 DIAGNOSIS — N952 Postmenopausal atrophic vaginitis: Secondary | ICD-10-CM | POA: Diagnosis not present

## 2017-12-25 DIAGNOSIS — M25562 Pain in left knee: Secondary | ICD-10-CM | POA: Diagnosis not present

## 2017-12-25 DIAGNOSIS — M25561 Pain in right knee: Secondary | ICD-10-CM | POA: Diagnosis not present

## 2018-01-19 DIAGNOSIS — M25562 Pain in left knee: Secondary | ICD-10-CM | POA: Diagnosis not present

## 2018-01-29 DIAGNOSIS — N952 Postmenopausal atrophic vaginitis: Secondary | ICD-10-CM | POA: Diagnosis not present

## 2018-02-11 DIAGNOSIS — Z01818 Encounter for other preprocedural examination: Secondary | ICD-10-CM | POA: Diagnosis not present

## 2018-02-11 DIAGNOSIS — I10 Essential (primary) hypertension: Secondary | ICD-10-CM | POA: Diagnosis not present

## 2018-02-11 DIAGNOSIS — Z79899 Other long term (current) drug therapy: Secondary | ICD-10-CM | POA: Diagnosis not present

## 2018-02-11 DIAGNOSIS — M6281 Muscle weakness (generalized): Secondary | ICD-10-CM | POA: Diagnosis not present

## 2018-02-11 DIAGNOSIS — D6851 Activated protein C resistance: Secondary | ICD-10-CM | POA: Diagnosis not present

## 2018-02-11 DIAGNOSIS — M1612 Unilateral primary osteoarthritis, left hip: Secondary | ICD-10-CM | POA: Diagnosis not present

## 2018-02-11 DIAGNOSIS — M19072 Primary osteoarthritis, left ankle and foot: Secondary | ICD-10-CM | POA: Diagnosis not present

## 2018-02-11 DIAGNOSIS — M1712 Unilateral primary osteoarthritis, left knee: Secondary | ICD-10-CM | POA: Diagnosis not present

## 2018-02-11 DIAGNOSIS — M25462 Effusion, left knee: Secondary | ICD-10-CM | POA: Diagnosis not present

## 2018-02-11 DIAGNOSIS — E039 Hypothyroidism, unspecified: Secondary | ICD-10-CM | POA: Diagnosis not present

## 2018-02-19 DIAGNOSIS — M1712 Unilateral primary osteoarthritis, left knee: Secondary | ICD-10-CM | POA: Diagnosis not present

## 2018-02-22 DIAGNOSIS — Z79899 Other long term (current) drug therapy: Secondary | ICD-10-CM | POA: Diagnosis not present

## 2018-02-22 DIAGNOSIS — G250 Essential tremor: Secondary | ICD-10-CM | POA: Diagnosis not present

## 2018-02-22 DIAGNOSIS — Z96652 Presence of left artificial knee joint: Secondary | ICD-10-CM | POA: Diagnosis not present

## 2018-02-22 DIAGNOSIS — K219 Gastro-esophageal reflux disease without esophagitis: Secondary | ICD-10-CM | POA: Diagnosis not present

## 2018-02-22 DIAGNOSIS — I10 Essential (primary) hypertension: Secondary | ICD-10-CM | POA: Diagnosis not present

## 2018-02-22 DIAGNOSIS — D6851 Activated protein C resistance: Secondary | ICD-10-CM | POA: Diagnosis not present

## 2018-02-22 DIAGNOSIS — E039 Hypothyroidism, unspecified: Secondary | ICD-10-CM | POA: Diagnosis not present

## 2018-02-22 DIAGNOSIS — M1712 Unilateral primary osteoarthritis, left knee: Secondary | ICD-10-CM | POA: Diagnosis not present

## 2018-02-22 DIAGNOSIS — Z880 Allergy status to penicillin: Secondary | ICD-10-CM | POA: Diagnosis not present

## 2018-02-22 DIAGNOSIS — Z885 Allergy status to narcotic agent status: Secondary | ICD-10-CM | POA: Diagnosis not present

## 2018-02-22 DIAGNOSIS — G8918 Other acute postprocedural pain: Secondary | ICD-10-CM | POA: Diagnosis not present

## 2018-02-22 DIAGNOSIS — Z471 Aftercare following joint replacement surgery: Secondary | ICD-10-CM | POA: Diagnosis not present

## 2018-02-26 DIAGNOSIS — I10 Essential (primary) hypertension: Secondary | ICD-10-CM | POA: Diagnosis not present

## 2018-02-26 DIAGNOSIS — Z471 Aftercare following joint replacement surgery: Secondary | ICD-10-CM | POA: Diagnosis not present

## 2018-02-26 DIAGNOSIS — E079 Disorder of thyroid, unspecified: Secondary | ICD-10-CM | POA: Diagnosis not present

## 2018-02-26 DIAGNOSIS — K219 Gastro-esophageal reflux disease without esophagitis: Secondary | ICD-10-CM | POA: Diagnosis not present

## 2018-02-28 DIAGNOSIS — K219 Gastro-esophageal reflux disease without esophagitis: Secondary | ICD-10-CM | POA: Diagnosis not present

## 2018-02-28 DIAGNOSIS — E079 Disorder of thyroid, unspecified: Secondary | ICD-10-CM | POA: Diagnosis not present

## 2018-02-28 DIAGNOSIS — Z471 Aftercare following joint replacement surgery: Secondary | ICD-10-CM | POA: Diagnosis not present

## 2018-02-28 DIAGNOSIS — I10 Essential (primary) hypertension: Secondary | ICD-10-CM | POA: Diagnosis not present

## 2018-03-01 DIAGNOSIS — I10 Essential (primary) hypertension: Secondary | ICD-10-CM | POA: Diagnosis not present

## 2018-03-01 DIAGNOSIS — Z471 Aftercare following joint replacement surgery: Secondary | ICD-10-CM | POA: Diagnosis not present

## 2018-03-01 DIAGNOSIS — K219 Gastro-esophageal reflux disease without esophagitis: Secondary | ICD-10-CM | POA: Diagnosis not present

## 2018-03-01 DIAGNOSIS — E079 Disorder of thyroid, unspecified: Secondary | ICD-10-CM | POA: Diagnosis not present

## 2018-03-05 DIAGNOSIS — K219 Gastro-esophageal reflux disease without esophagitis: Secondary | ICD-10-CM | POA: Diagnosis not present

## 2018-03-05 DIAGNOSIS — Z471 Aftercare following joint replacement surgery: Secondary | ICD-10-CM | POA: Diagnosis not present

## 2018-03-05 DIAGNOSIS — E079 Disorder of thyroid, unspecified: Secondary | ICD-10-CM | POA: Diagnosis not present

## 2018-03-05 DIAGNOSIS — I10 Essential (primary) hypertension: Secondary | ICD-10-CM | POA: Diagnosis not present

## 2018-03-07 DIAGNOSIS — Z471 Aftercare following joint replacement surgery: Secondary | ICD-10-CM | POA: Diagnosis not present

## 2018-03-07 DIAGNOSIS — K219 Gastro-esophageal reflux disease without esophagitis: Secondary | ICD-10-CM | POA: Diagnosis not present

## 2018-03-07 DIAGNOSIS — I10 Essential (primary) hypertension: Secondary | ICD-10-CM | POA: Diagnosis not present

## 2018-03-07 DIAGNOSIS — E079 Disorder of thyroid, unspecified: Secondary | ICD-10-CM | POA: Diagnosis not present

## 2018-03-08 DIAGNOSIS — Z471 Aftercare following joint replacement surgery: Secondary | ICD-10-CM | POA: Diagnosis not present

## 2018-03-08 DIAGNOSIS — I10 Essential (primary) hypertension: Secondary | ICD-10-CM | POA: Diagnosis not present

## 2018-03-08 DIAGNOSIS — K219 Gastro-esophageal reflux disease without esophagitis: Secondary | ICD-10-CM | POA: Diagnosis not present

## 2018-03-08 DIAGNOSIS — E079 Disorder of thyroid, unspecified: Secondary | ICD-10-CM | POA: Diagnosis not present

## 2018-03-11 DIAGNOSIS — K219 Gastro-esophageal reflux disease without esophagitis: Secondary | ICD-10-CM | POA: Diagnosis not present

## 2018-03-11 DIAGNOSIS — Z471 Aftercare following joint replacement surgery: Secondary | ICD-10-CM | POA: Diagnosis not present

## 2018-03-11 DIAGNOSIS — E079 Disorder of thyroid, unspecified: Secondary | ICD-10-CM | POA: Diagnosis not present

## 2018-03-11 DIAGNOSIS — I10 Essential (primary) hypertension: Secondary | ICD-10-CM | POA: Diagnosis not present

## 2018-03-13 DIAGNOSIS — I10 Essential (primary) hypertension: Secondary | ICD-10-CM | POA: Diagnosis not present

## 2018-03-13 DIAGNOSIS — Z471 Aftercare following joint replacement surgery: Secondary | ICD-10-CM | POA: Diagnosis not present

## 2018-03-13 DIAGNOSIS — K219 Gastro-esophageal reflux disease without esophagitis: Secondary | ICD-10-CM | POA: Diagnosis not present

## 2018-03-13 DIAGNOSIS — E079 Disorder of thyroid, unspecified: Secondary | ICD-10-CM | POA: Diagnosis not present

## 2018-03-20 DIAGNOSIS — M25562 Pain in left knee: Secondary | ICD-10-CM | POA: Diagnosis not present

## 2018-04-11 DIAGNOSIS — Z23 Encounter for immunization: Secondary | ICD-10-CM | POA: Diagnosis not present

## 2018-05-27 DIAGNOSIS — R1900 Intra-abdominal and pelvic swelling, mass and lump, unspecified site: Secondary | ICD-10-CM | POA: Diagnosis not present

## 2018-06-04 DIAGNOSIS — R1900 Intra-abdominal and pelvic swelling, mass and lump, unspecified site: Secondary | ICD-10-CM | POA: Diagnosis not present

## 2018-06-04 DIAGNOSIS — K439 Ventral hernia without obstruction or gangrene: Secondary | ICD-10-CM | POA: Diagnosis not present

## 2018-06-05 DIAGNOSIS — M25562 Pain in left knee: Secondary | ICD-10-CM | POA: Diagnosis not present

## 2018-06-07 DIAGNOSIS — Z1231 Encounter for screening mammogram for malignant neoplasm of breast: Secondary | ICD-10-CM | POA: Diagnosis not present

## 2018-06-07 DIAGNOSIS — Z803 Family history of malignant neoplasm of breast: Secondary | ICD-10-CM | POA: Diagnosis not present

## 2018-06-11 DIAGNOSIS — I10 Essential (primary) hypertension: Secondary | ICD-10-CM | POA: Diagnosis not present

## 2018-06-11 DIAGNOSIS — Z9181 History of falling: Secondary | ICD-10-CM | POA: Diagnosis not present

## 2018-06-11 DIAGNOSIS — Z0000 Encounter for general adult medical examination without abnormal findings: Secondary | ICD-10-CM | POA: Diagnosis not present

## 2018-06-12 ENCOUNTER — Ambulatory Visit: Payer: Self-pay | Admitting: Surgery

## 2018-06-12 NOTE — H&P (View-Only) (Signed)
Elizabeth Mcclain Documented: 05/27/2018 3:04 PM Location: [facility] Patient #: [id number] DOB: 01-20-1946 Married / Language: English / Race: White Female   History of Present Illness Elizabeth Mcclain A. Elizabeth [name] MD; 05/27/2018 4:05 PM) Patient words: Patient returns for follow-up after umbilical hernia repair 1 year ago. She had a bulge noted this summer. CT scan was done which showed some inflammation but no evidence of hernia recurrence. Her symptoms are about the same but she wants to bulge recheck. She has some mild discomfort but no severe pain at that site. She thinks it might be getting larger.  The patient is a 73 year old female.   Allergies Elizabeth Mcclain, CMA; 05/27/2018 3:08 PM) Percocet *ANALGESICS - OPIOID*  Penicillin G Pot in Dextrose *PENICILLINS*  Rash.  Medication History Elizabeth Mcclain, CMA; 05/27/2018 3:09 PM) amLODIPine Besylate (5MG  Tablet, Oral) Active. Losartan Potassium (100MG  Tablet, Oral) Active. ALPRAZolam (0.25MG  Tablet, Oral) Active. HydroCHLOROthiazide (25MG  Tablet, Oral) Active. PARoxetine HCl (40MG  Tablet, Oral) Active. Levothyroxine Sodium (25MCG Tablet, Oral) Active. Vitamin D2 (Oral) Specific strength unknown - Active. Medications Reconciled  Vitals (Elizabeth Mcclain CMA; 05/27/2018 3:08 PM) 05/27/2018 3:08 PM Weight: 167.8 lb Height: 60in Body Surface Area: 1.73 m Body Mass Index: 32.77 kg/m  BP: 118/80 (Sitting, Left Arm, Standard)       Physical Exam (Elizabeth Mcclain A. [name] MD; 05/27/2018 4:05 PM) General Mental Status-Alert. General Appearance-Consistent with stated age. Hydration-Well hydrated. Voice-Normal.  Head and Neck Head-normocephalic, atraumatic with no lesions or palpable masses.  Abdomen Note: Incision healed disorder umbilicus. There is some fullness just to the right umbilicus which feels like scar tissue. With Valsalva this does not appear to move much. No rebound or guarding.     Assessment &  Plan ([name] A. [name] MD; 05/27/2018 4:06 PM) ABDOMINAL WALL BULGE (R19.00) Impression: Repeat CT scanning.  Further care after that stent to determine if the bulges are recurrent hernia versus persistent scar tissue. I discussed the laparoscopic approach and she does recur. The risk of hernia repair include bleeding, infection, organ injury, bowel injury, bladder injury, nerve injury recurrent hernia, blood clots, worsening of underlying condition, chronic pain, mesh use, open Mcclain, death, and the need for other operattions. Pt agrees to proceed Current Plans Pt Education - [facility] Free Text Education/Instructions: discussed with patient and provided information. Pt Education - [facility] Laparoscopic Mcclain HCI The anatomy & physiology of the abdominal wall was discussed. The pathophysiology of hernias was discussed. Natural history risks without Mcclain including progeressive enlargement, pain, incarceration, & strangulation was discussed. Contributors to complications such as smoking, obesity, diabetes, prior Mcclain, etc were discussed.  I feel the risks of no intervention will lead to serious problems that outweigh the operative risks; therefore, I recommended Mcclain to reduce and repair the hernia. I explained laparoscopic techniques with possible need for an open approach. I noted the probable use of mesh to patch and/or buttress the hernia repair  Risks such as bleeding, infection, abscess, need for further treatment, heart attack, death, and other risks were discussed. I noted a good likelihood this will help address the problem. Goals of post-operative recovery were discussed as well. Possibility that this will not correct all symptoms was explained. I stressed the importance of low-impact activity, aggressive pain control, avoiding constipation, & not pushing through pain to minimize risk of post-operative chronic pain or injury. Possibility of reherniation especially with smoking,  obesity, diabetes, immunosuppression, and other health conditions was discussed. We will work to minimize complications.  An educational handout  further explaining the pathology & treatment options was given as well. Questions were answered. The patient expresses understanding & wishes to proceed with Mcclain.  Pt Education - [facility] Mesh education: discussed with patient and provided information.

## 2018-06-12 NOTE — H&P (Signed)
Elizabeth Mcclain Documented: 05/27/2018 3:04 PM Location: [facility] Patient #: [id number] DOB: 04-Dec-1945 Married / Language: English / Race: White Female   History of Present Illness Elizabeth Mcclain A. Elizabeth Tippets MD; 05/27/2018 4:05 PM) Patient words: Patient returns for follow-up after umbilical hernia repair 1 year ago. She had a bulge noted this summer. CT scan was done which showed some inflammation but no evidence of hernia recurrence. Her symptoms are about the same but she wants to bulge recheck. She has some mild discomfort but no severe pain at that site. She thinks it might be getting larger.  The patient is a 73 year old female.   Allergies Elizabeth Mcclain, CMA; 05/27/2018 3:08 PM) Percocet *ANALGESICS - OPIOID*  Penicillin G Pot in Dextrose *PENICILLINS*  Rash.  Medication History Elizabeth Mcclain, CMA; 05/27/2018 3:09 PM) amLODIPine Besylate (5MG  Tablet, Oral) Active. Losartan Potassium (100MG  Tablet, Oral) Active. ALPRAZolam (0.25MG  Tablet, Oral) Active. HydroCHLOROthiazide (25MG  Tablet, Oral) Active. PARoxetine HCl (40MG  Tablet, Oral) Active. Levothyroxine Sodium (25MCG Tablet, Oral) Active. Vitamin D2 (Oral) Specific strength unknown - Active. Medications Reconciled  Vitals (Elizabeth Mcclain CMA; 05/27/2018 3:08 PM) 05/27/2018 3:08 PM Weight: 167.8 lb Height: 60in Body Surface Area: 1.73 m Body Mass Index: 32.77 kg/m  BP: 118/80 (Sitting, Left Arm, Standard)       Physical Exam (Elizabeth Noteboom A. Laden Fieldhouse MD; 05/27/2018 4:05 PM) General Mental Status-Alert. General Appearance-Consistent with stated age. Hydration-Well hydrated. Voice-Normal.  Head and Neck Head-normocephalic, atraumatic with no lesions or palpable masses.  Abdomen Note: Incision healed disorder umbilicus. There is some fullness just to the right umbilicus which feels like scar tissue. With Valsalva this does not appear to move much. No rebound or guarding.     Assessment &  Plan (Elizabeth Sitzmann A. Kshawn Canal MD; 05/27/2018 4:06 PM) ABDOMINAL WALL BULGE (R19.00) Impression: Repeat CT scanning.  Further care after that stent to determine if the bulges are recurrent hernia versus persistent scar tissue. I discussed the laparoscopic approach and she does recur. The risk of hernia repair include bleeding, infection, organ injury, bowel injury, bladder injury, nerve injury recurrent hernia, blood clots, worsening of underlying condition, chronic pain, mesh use, open surgery, death, and the need for other operattions. Pt agrees to proceed Current Plans Pt Education - [facility] Free Text Education/Instructions: discussed with patient and provided information. Pt Education - [facility] Laparoscopic Surgery HCI The anatomy & physiology of the abdominal wall was discussed. The pathophysiology of hernias was discussed. Natural history risks without surgery including progeressive enlargement, pain, incarceration, & strangulation was discussed. Contributors to complications such as smoking, obesity, diabetes, prior surgery, etc were discussed.  I feel the risks of no intervention will lead to serious problems that outweigh the operative risks; therefore, I recommended surgery to reduce and repair the hernia. I explained laparoscopic techniques with possible need for an open approach. I noted the probable use of mesh to patch and/or buttress the hernia repair  Risks such as bleeding, infection, abscess, need for further treatment, heart attack, death, and other risks were discussed. I noted a good likelihood this will help address the problem. Goals of post-operative recovery were discussed as well. Possibility that this will not correct all symptoms was explained. I stressed the importance of low-impact activity, aggressive pain control, avoiding constipation, & not pushing through pain to minimize risk of post-operative chronic pain or injury. Possibility of reherniation especially with smoking,  obesity, diabetes, immunosuppression, and other health conditions was discussed. We will work to minimize complications.  An educational handout  further explaining the pathology & treatment options was given as well. Questions were answered. The patient expresses understanding & wishes to proceed with surgery.  Pt Education - [facility] Mesh education: discussed with patient and provided information.

## 2018-06-26 ENCOUNTER — Inpatient Hospital Stay (HOSPITAL_COMMUNITY)
Admission: RE | Admit: 2018-06-26 | Discharge: 2018-06-26 | Disposition: A | Discharge status: Home / Self Care | Payer: Medicare Other | Source: Ambulatory Visit

## 2018-06-26 NOTE — Pre-Procedure Instructions (Addendum)
KRIZIA FLIGHT  06/26/2018      [id number] - [place], [place] [address] [place] [address] Phone: [phone] Fax: [phone]    Your procedure is scheduled on July 03, 2018.  Report to [facility] Admitting at 1100 AM.  Call this number if you have problems the morning of surgery:  [phone]   Remember:  Do not eat after midnight.  You may drink clear liquids until 1000 AM .  Clear liquids allowed are:   Water, Juice (non-citric and without pulp), Clear Tea, Black Coffee only and Gatorade    Take these medicines the morning of surgery with A SIP OF WATER  amLODipine (NORVASC)  levothyroxine (SYNTHROID, LEVOTHROID) omeprazole (PRILOSEC) PARoxetine (PAXIL) ALPRAZolam (XANAX) -if needed  Beginning now STOP taking any Celebrex, Aspirin (unless otherwise instructed by your surgeon), Aleve, Naproxen, Ibuprofen, Motrin, Advil, Goody's, BC's, all herbal medications, fish oil, and all vitamins    Do not wear jewelry, make-up or nail polish.  Do not wear lotions, powders, or perfumes, or deodorant.  Do not shave 48 hours prior to surgery.    Do not bring valuables to the hospital.  [facility] is not responsible for any belongings or valuables.  Contacts, dentures or bridgework may not be worn into surgery.  Leave your suitcase in the car.  After surgery it may be brought to your room.  For patients admitted to the hospital, discharge time will be determined by your treatment team.  Patients discharged the day of surgery will not be allowed to drive home.    [facility]- Preparing For Surgery  Before surgery, you can play an important role. Because skin is not sterile, your skin needs to be as free of germs as possible. You can reduce the number of germs on your skin by washing with CHG (chlorahexidine gluconate) Soap before surgery.  CHG is an antiseptic cleaner which kills germs and bonds with the  skin to continue killing germs even after washing.    Oral Hygiene is also important to reduce your risk of infection.  Remember - BRUSH YOUR TEETH THE MORNING OF SURGERY WITH YOUR REGULAR TOOTHPASTE  Please do not use if you have an allergy to CHG or antibacterial soaps. If your skin becomes reddened/irritated stop using the CHG.  Do not shave (including legs and underarms) for at least 48 hours prior to first CHG shower. It is OK to shave your face.  Please follow these instructions carefully.   1. Shower the NIGHT BEFORE SURGERY and the MORNING OF SURGERY with CHG.   2. If you chose to wash your hair, wash your hair first as usual with your normal shampoo.  3. After you shampoo, rinse your hair and body thoroughly to remove the shampoo.  4. Use CHG as you would any other liquid soap. You can apply CHG directly to the skin and wash gently with a scrungie or a clean washcloth.   5. Apply the CHG Soap to your body ONLY FROM THE NECK DOWN.  Do not use on open wounds or open sores. Avoid contact with your eyes, ears, mouth and genitals (private parts). Wash Face and genitals (private parts)  with your normal soap.  6. Wash thoroughly, paying special attention to the area where your surgery will be performed.  7. Thoroughly rinse your body with warm water from the neck down.  8. DO NOT shower/wash with your  normal soap after using and rinsing off the CHG Soap.  9. Pat yourself dry with a CLEAN TOWEL.  10. Wear CLEAN PAJAMAS to bed the night before surgery, wear comfortable clothes the morning of surgery  11. Place CLEAN SHEETS on your bed the night of your first shower and DO NOT SLEEP WITH PETS.   Day of Surgery:  Do not apply any deodorants/lotions.  Please wear clean clothes to the hospital/surgery center.   Remember to brush your teeth WITH YOUR REGULAR TOOTHPASTE.   Please read over the following fact sheets that you were given.

## 2018-06-26 NOTE — Progress Notes (Signed)
PCP - [facility] Cardiologist - pt denies  Chest x-ray - pt denies past year EKG - 06/26/2018  Stress Test - pt denies ECHO - pt denies  Cardiac Cath - pt denies  Sleep Study -  CPAP -   Fasting Blood Sugar - n/a Checks Blood Sugar _____ times a day-n/a  Blood Thinner Instructions: n/a Aspirin Instructions: n/a  Anesthesia review:   Patient denies shortness of breath, fever, cough and chest pain at PAT appointment  Patient verbalized understanding of instructions that were given to them at the PAT appointment. Patient was also instructed that they will need to review over the PAT instructions again at home before surgery.

## 2018-07-01 ENCOUNTER — Encounter (HOSPITAL_COMMUNITY)
Admission: RE | Admit: 2018-07-01 | Discharge: 2018-07-01 | Disposition: A | Discharge status: Home / Self Care | Payer: Medicare Other | Source: Ambulatory Visit | Attending: Surgery | Admitting: Surgery

## 2018-07-01 ENCOUNTER — Encounter (HOSPITAL_COMMUNITY): Payer: Self-pay

## 2018-07-01 ENCOUNTER — Other Ambulatory Visit: Payer: Self-pay

## 2018-07-01 DIAGNOSIS — E871 Hypo-osmolality and hyponatremia: Secondary | ICD-10-CM | POA: Diagnosis not present

## 2018-07-01 DIAGNOSIS — G9341 Metabolic encephalopathy: Secondary | ICD-10-CM | POA: Diagnosis not present

## 2018-07-01 DIAGNOSIS — E876 Hypokalemia: Secondary | ICD-10-CM | POA: Diagnosis not present

## 2018-07-01 DIAGNOSIS — K567 Ileus, unspecified: Secondary | ICD-10-CM | POA: Diagnosis not present

## 2018-07-01 DIAGNOSIS — Z01818 Encounter for other preprocedural examination: Secondary | ICD-10-CM

## 2018-07-01 DIAGNOSIS — R740 Nonspecific elevation of levels of transaminase and lactic acid dehydrogenase [LDH]: Secondary | ICD-10-CM | POA: Diagnosis not present

## 2018-07-01 DIAGNOSIS — B961 Klebsiella pneumoniae [K. pneumoniae] as the cause of diseases classified elsewhere: Secondary | ICD-10-CM | POA: Diagnosis not present

## 2018-07-01 DIAGNOSIS — D6851 Activated protein C resistance: Secondary | ICD-10-CM | POA: Diagnosis not present

## 2018-07-01 DIAGNOSIS — J9601 Acute respiratory failure with hypoxia: Secondary | ICD-10-CM | POA: Diagnosis not present

## 2018-07-01 DIAGNOSIS — R34 Anuria and oliguria: Secondary | ICD-10-CM | POA: Diagnosis not present

## 2018-07-01 DIAGNOSIS — D649 Anemia, unspecified: Secondary | ICD-10-CM | POA: Diagnosis not present

## 2018-07-01 DIAGNOSIS — R0603 Acute respiratory distress: Secondary | ICD-10-CM | POA: Diagnosis not present

## 2018-07-01 DIAGNOSIS — N179 Acute kidney failure, unspecified: Secondary | ICD-10-CM | POA: Diagnosis not present

## 2018-07-01 DIAGNOSIS — E039 Hypothyroidism, unspecified: Secondary | ICD-10-CM | POA: Diagnosis not present

## 2018-07-01 DIAGNOSIS — I10 Essential (primary) hypertension: Secondary | ICD-10-CM | POA: Diagnosis not present

## 2018-07-01 DIAGNOSIS — J690 Pneumonitis due to inhalation of food and vomit: Secondary | ICD-10-CM | POA: Diagnosis not present

## 2018-07-01 DIAGNOSIS — E874 Mixed disorder of acid-base balance: Secondary | ICD-10-CM | POA: Diagnosis not present

## 2018-07-01 DIAGNOSIS — R0902 Hypoxemia: Secondary | ICD-10-CM | POA: Diagnosis not present

## 2018-07-01 DIAGNOSIS — I952 Hypotension due to drugs: Secondary | ICD-10-CM | POA: Diagnosis not present

## 2018-07-01 DIAGNOSIS — K432 Incisional hernia without obstruction or gangrene: Secondary | ICD-10-CM | POA: Diagnosis not present

## 2018-07-01 HISTORY — DX: Unspecified osteoarthritis, unspecified site: M19.90

## 2018-07-01 LAB — COMPREHENSIVE METABOLIC PANEL
ALT: 23 U/L (ref 0–44)
AST: 23 U/L (ref 15–41)
Albumin: 4.1 g/dL (ref 3.5–5.0)
Alkaline Phosphatase: 76 U/L (ref 38–126)
Anion gap: 10 (ref 5–15)
BILIRUBIN TOTAL: 0.7 mg/dL (ref 0.3–1.2)
BUN: 16 mg/dL (ref 8–23)
CO2: 26 mmol/L (ref 22–32)
Calcium: 9.6 mg/dL (ref 8.9–10.3)
Chloride: 105 mmol/L (ref 98–111)
Creatinine, Ser: 0.91 mg/dL (ref 0.44–1.00)
GFR calc Af Amer: >60 mL/min (ref >60)
Glucose, Bld: 106 mg/dL — ABNORMAL HIGH (ref 70–99)
Potassium: 4.2 mmol/L (ref 3.5–5.1)
Sodium: 141 mmol/L (ref 135–145)
Total Protein: 7.3 g/dL (ref 6.5–8.1)

## 2018-07-01 LAB — CBC WITH DIFFERENTIAL/PLATELET
Abs Immature Granulocytes: 0.04 10*3/uL (ref 0.00–0.07)
Basophils Absolute: 0.1 10*3/uL (ref 0.0–0.1)
Basophils Relative: 1 %
Eosinophils Absolute: 0.3 10*3/uL (ref 0.0–0.5)
Eosinophils Relative: 4 %
HCT: 42.4 % (ref 36.0–46.0)
Hemoglobin: 13.5 g/dL (ref 12.0–15.0)
Immature Granulocytes: 1 %
Lymphocytes Relative: 26 %
Lymphs Abs: 2.3 10*3/uL (ref 0.7–4.0)
MCH: 29.5 pg (ref 26.0–34.0)
MCHC: 31.8 g/dL (ref 30.0–36.0)
MCV: 92.8 fL (ref 80.0–100.0)
MONOS PCT: 11 %
Monocytes Absolute: 1 10*3/uL (ref 0.1–1.0)
Neutro Abs: 5.1 10*3/uL (ref 1.7–7.7)
Neutrophils Relative %: 57 %
Platelets: 277 10*3/uL (ref 150–400)
RBC: 4.57 MIL/uL (ref 3.87–5.11)
RDW: 14.4 % (ref 11.5–15.5)
WBC: 8.8 10*3/uL (ref 4.0–10.5)
nRBC: 0 % (ref 0.0–0.2)

## 2018-07-01 NOTE — Progress Notes (Signed)
PCP - Dr. Lovette Cliche Cardiologist - denies  Chest x-ray - N/A EKG - 07/01/2018 Stress Test - denies ECHO - denies Cardiac Cath - denies  Sleep Study - denies  Blood Thinner Instructions:N/A Aspirin Instructions:N/A  Anesthesia review: No  Patient denies shortness of breath, fever, cough and chest pain at PAT appointment   Patient verbalized understanding of instructions that were given to them at the PAT appointment. Patient was also instructed that they will need to review over the PAT instructions again at home before surgery.

## 2018-07-03 ENCOUNTER — Inpatient Hospital Stay (HOSPITAL_COMMUNITY)
Admission: AD | Admit: 2018-07-03 | Discharge: 2018-07-11 | DRG: 353 | Disposition: A | Discharge status: Home Health | Payer: Medicare Other | Attending: Internal Medicine | Admitting: Internal Medicine

## 2018-07-03 ENCOUNTER — Encounter (HOSPITAL_COMMUNITY): Payer: Self-pay

## 2018-07-03 ENCOUNTER — Other Ambulatory Visit: Payer: Self-pay

## 2018-07-03 ENCOUNTER — Ambulatory Visit (HOSPITAL_COMMUNITY): Payer: Medicare Other | Admitting: Certified Registered Nurse Anesthetist

## 2018-07-03 ENCOUNTER — Ambulatory Visit (HOSPITAL_COMMUNITY): Payer: Medicare Other | Admitting: Physician Assistant

## 2018-07-03 ENCOUNTER — Encounter (HOSPITAL_COMMUNITY)
Admission: AD | Disposition: A | Discharge status: Home Health | Payer: Self-pay | Source: Home / Self Care | Attending: Internal Medicine

## 2018-07-03 DIAGNOSIS — K432 Incisional hernia without obstruction or gangrene: Principal | ICD-10-CM | POA: Diagnosis present

## 2018-07-03 DIAGNOSIS — I10 Essential (primary) hypertension: Secondary | ICD-10-CM | POA: Diagnosis present

## 2018-07-03 DIAGNOSIS — R059 Cough, unspecified: Secondary | ICD-10-CM

## 2018-07-03 DIAGNOSIS — D6851 Activated protein C resistance: Secondary | ICD-10-CM | POA: Diagnosis present

## 2018-07-03 DIAGNOSIS — J690 Pneumonitis due to inhalation of food and vomit: Secondary | ICD-10-CM | POA: Diagnosis not present

## 2018-07-03 DIAGNOSIS — Z6839 Body mass index (BMI) 39.0-39.9, adult: Secondary | ICD-10-CM

## 2018-07-03 DIAGNOSIS — K567 Ileus, unspecified: Secondary | ICD-10-CM

## 2018-07-03 DIAGNOSIS — R0902 Hypoxemia: Secondary | ICD-10-CM

## 2018-07-03 DIAGNOSIS — E039 Hypothyroidism, unspecified: Secondary | ICD-10-CM | POA: Diagnosis present

## 2018-07-03 DIAGNOSIS — D649 Anemia, unspecified: Secondary | ICD-10-CM | POA: Diagnosis not present

## 2018-07-03 DIAGNOSIS — E876 Hypokalemia: Secondary | ICD-10-CM | POA: Diagnosis not present

## 2018-07-03 DIAGNOSIS — I952 Hypotension due to drugs: Secondary | ICD-10-CM | POA: Diagnosis not present

## 2018-07-03 DIAGNOSIS — T884XXA Failed or difficult intubation, initial encounter: Secondary | ICD-10-CM

## 2018-07-03 DIAGNOSIS — J9601 Acute respiratory failure with hypoxia: Secondary | ICD-10-CM

## 2018-07-03 DIAGNOSIS — B961 Klebsiella pneumoniae [K. pneumoniae] as the cause of diseases classified elsewhere: Secondary | ICD-10-CM | POA: Diagnosis not present

## 2018-07-03 DIAGNOSIS — R0603 Acute respiratory distress: Secondary | ICD-10-CM

## 2018-07-03 DIAGNOSIS — R05 Cough: Secondary | ICD-10-CM

## 2018-07-03 DIAGNOSIS — E874 Mixed disorder of acid-base balance: Secondary | ICD-10-CM | POA: Diagnosis not present

## 2018-07-03 DIAGNOSIS — R34 Anuria and oliguria: Secondary | ICD-10-CM | POA: Diagnosis not present

## 2018-07-03 DIAGNOSIS — Z978 Presence of other specified devices: Secondary | ICD-10-CM

## 2018-07-03 DIAGNOSIS — E669 Obesity, unspecified: Secondary | ICD-10-CM | POA: Diagnosis present

## 2018-07-03 DIAGNOSIS — N179 Acute kidney failure, unspecified: Secondary | ICD-10-CM

## 2018-07-03 DIAGNOSIS — F418 Other specified anxiety disorders: Secondary | ICD-10-CM | POA: Diagnosis present

## 2018-07-03 DIAGNOSIS — E871 Hypo-osmolality and hyponatremia: Secondary | ICD-10-CM | POA: Diagnosis not present

## 2018-07-03 DIAGNOSIS — R109 Unspecified abdominal pain: Secondary | ICD-10-CM

## 2018-07-03 DIAGNOSIS — G9341 Metabolic encephalopathy: Secondary | ICD-10-CM | POA: Diagnosis not present

## 2018-07-03 DIAGNOSIS — R740 Nonspecific elevation of levels of transaminase and lactic acid dehydrogenase [LDH]: Secondary | ICD-10-CM | POA: Diagnosis not present

## 2018-07-03 HISTORY — PX: INCISIONAL HERNIA REPAIR: SHX193

## 2018-07-03 LAB — CREATININE, SERUM
Creatinine, Ser: 1.32 mg/dL — ABNORMAL HIGH (ref 0.44–1.00)
GFR calc Af Amer: 47 mL/min — ABNORMAL LOW (ref >60)
GFR calc non Af Amer: 40 mL/min — ABNORMAL LOW (ref >60)

## 2018-07-03 LAB — CBC
HCT: 37.2 % (ref 36.0–46.0)
Hemoglobin: 11.8 g/dL — ABNORMAL LOW (ref 12.0–15.0)
MCH: 28.6 pg (ref 26.0–34.0)
MCHC: 31.7 g/dL (ref 30.0–36.0)
MCV: 90.1 fL (ref 80.0–100.0)
Platelets: 270 10*3/uL (ref 150–400)
RBC: 4.13 MIL/uL (ref 3.87–5.11)
RDW: 14.3 % (ref 11.5–15.5)
WBC: 11.3 10*3/uL — ABNORMAL HIGH (ref 4.0–10.5)
nRBC: 0 % (ref 0.0–0.2)

## 2018-07-03 SURGERY — REPAIR, HERNIA, INCISIONAL, LAPAROSCOPIC
Anesthesia: General | Site: Abdomen

## 2018-07-03 MED ORDER — VITAMIN B-12 1000 MCG PO TABS
1000.0000 ug | ORAL_TABLET | Freq: Every day | ORAL | Status: DC
  Administered 2018-07-03 – 2018-07-11 (×7): 1000 ug via ORAL
  Filled 2018-07-03 (×10): qty 1

## 2018-07-03 MED ORDER — LIDOCAINE 2% (20 MG/ML) 5 ML SYRINGE
INTRAMUSCULAR | Status: DC | PRN
  Administered 2018-07-03: 60 mg via INTRAVENOUS

## 2018-07-03 MED ORDER — HYDROMORPHONE HCL 1 MG/ML IJ SOLN
0.2500 mg | INTRAMUSCULAR | Status: DC | PRN
  Administered 2018-07-03 (×4): 0.25 mg via INTRAVENOUS

## 2018-07-03 MED ORDER — METHOCARBAMOL 500 MG PO TABS
500.0000 mg | ORAL_TABLET | Freq: Four times a day (QID) | ORAL | Status: DC | PRN
  Administered 2018-07-03 – 2018-07-04 (×2): 500 mg via ORAL
  Filled 2018-07-03 (×3): qty 1

## 2018-07-03 MED ORDER — CHLORHEXIDINE GLUCONATE CLOTH 2 % EX PADS: 6.0000 | MEDICATED_PAD | Freq: Once | CUTANEOUS | Status: DC

## 2018-07-03 MED ORDER — PROPOFOL 10 MG/ML IV BOLUS
INTRAVENOUS | Status: DC | PRN
  Administered 2018-07-03: 160 mg via INTRAVENOUS

## 2018-07-03 MED ORDER — HEPARIN SODIUM (PORCINE) 5000 UNIT/ML IJ SOLN
5000.0000 [IU] | Freq: Once | INTRAMUSCULAR | Status: AC
  Administered 2018-07-03: 5000 [IU] via SUBCUTANEOUS

## 2018-07-03 MED ORDER — PAROXETINE HCL 20 MG PO TABS
40.0000 mg | ORAL_TABLET | Freq: Every day | ORAL | Status: DC
  Administered 2018-07-04: 40 mg via ORAL
  Filled 2018-07-03 (×2): qty 2

## 2018-07-03 MED ORDER — SODIUM CHLORIDE (PF) 0.9 % IJ SOLN
INTRAMUSCULAR | Status: DC | PRN
  Administered 2018-07-03: 30 mL

## 2018-07-03 MED ORDER — ALBUMIN HUMAN 5 % IV SOLN
INTRAVENOUS | Status: DC | PRN
  Administered 2018-07-03: 14:00:00 via INTRAVENOUS

## 2018-07-03 MED ORDER — ADULT MULTIVITAMIN W/MINERALS CH
1.0000 | ORAL_TABLET | Freq: Every day | ORAL | Status: DC
  Administered 2018-07-03 – 2018-07-11 (×7): 1 via ORAL
  Filled 2018-07-03 (×9): qty 1

## 2018-07-03 MED ORDER — DARIFENACIN HYDROBROMIDE ER 7.5 MG PO TB24
7.5000 mg | ORAL_TABLET | Freq: Every day | ORAL | Status: DC
  Administered 2018-07-03 – 2018-07-05 (×3): 7.5 mg via ORAL
  Filled 2018-07-03 (×5): qty 1

## 2018-07-03 MED ORDER — ROCURONIUM BROMIDE 50 MG/5ML IV SOSY
PREFILLED_SYRINGE | INTRAVENOUS | Status: AC
  Filled 2018-07-03: qty 5

## 2018-07-03 MED ORDER — OXYCODONE HCL 5 MG PO TABS: 5.0000 mg | ORAL_TABLET | Freq: Four times a day (QID) | ORAL | 0 refills | Status: DC | PRN

## 2018-07-03 MED ORDER — CELECOXIB 200 MG PO CAPS
400.0000 mg | ORAL_CAPSULE | ORAL | Status: AC
  Administered 2018-07-03: 400 mg via ORAL

## 2018-07-03 MED ORDER — HYDROMORPHONE HCL 1 MG/ML IJ SOLN
1.0000 mg | INTRAMUSCULAR | Status: DC | PRN
  Administered 2018-07-03 – 2018-07-04 (×3): 1 mg via INTRAVENOUS
  Filled 2018-07-03 (×3): qty 1

## 2018-07-03 MED ORDER — PROMETHAZINE HCL 25 MG/ML IJ SOLN: 6.2500 mg | INTRAMUSCULAR | Status: DC | PRN

## 2018-07-03 MED ORDER — CELECOXIB 200 MG PO CAPS
ORAL_CAPSULE | ORAL | Status: AC
  Filled 2018-07-03: qty 2

## 2018-07-03 MED ORDER — GABAPENTIN 300 MG PO CAPS
300.0000 mg | ORAL_CAPSULE | ORAL | Status: AC
  Administered 2018-07-03: 300 mg via ORAL

## 2018-07-03 MED ORDER — ONDANSETRON 4 MG PO TBDP
4.0000 mg | ORAL_TABLET | Freq: Four times a day (QID) | ORAL | Status: DC | PRN
  Filled 2018-07-03: qty 1

## 2018-07-03 MED ORDER — GABAPENTIN 300 MG PO CAPS
ORAL_CAPSULE | ORAL | Status: AC
  Administered 2018-07-03: 300 mg via ORAL
  Filled 2018-07-03: qty 1

## 2018-07-03 MED ORDER — MIDAZOLAM HCL 2 MG/2ML IJ SOLN
INTRAMUSCULAR | Status: AC
  Filled 2018-07-03: qty 2

## 2018-07-03 MED ORDER — CELECOXIB 200 MG PO CAPS
200.0000 mg | ORAL_CAPSULE | Freq: Two times a day (BID) | ORAL | Status: DC
  Administered 2018-07-03 – 2018-07-04 (×2): 200 mg via ORAL
  Filled 2018-07-03 (×2): qty 1

## 2018-07-03 MED ORDER — HYDRALAZINE HCL 20 MG/ML IJ SOLN: 10.0000 mg | INTRAMUSCULAR | Status: DC | PRN

## 2018-07-03 MED ORDER — BUPIVACAINE LIPOSOME 1.3 % IJ SUSP
20.0000 mL | INTRAMUSCULAR | Status: AC
  Administered 2018-07-03: 30 mL
  Filled 2018-07-03: qty 20

## 2018-07-03 MED ORDER — SUGAMMADEX SODIUM 200 MG/2ML IV SOLN: INTRAVENOUS | Status: DC | PRN

## 2018-07-03 MED ORDER — ONDANSETRON HCL 4 MG PO TABS: 4.0000 mg | ORAL_TABLET | Freq: Three times a day (TID) | ORAL | 0 refills | Status: DC | PRN

## 2018-07-03 MED ORDER — HYDROCHLOROTHIAZIDE 25 MG PO TABS
25.0000 mg | ORAL_TABLET | Freq: Every day | ORAL | Status: DC
  Administered 2018-07-03: 25 mg via ORAL
  Filled 2018-07-03: qty 1

## 2018-07-03 MED ORDER — DIPHENHYDRAMINE HCL (SLEEP) 50 MG/30ML PO LIQD: 50.0000 mg | Freq: Every evening | ORAL | Status: DC | PRN

## 2018-07-03 MED ORDER — TRAMADOL HCL 50 MG PO TABS
50.0000 mg | ORAL_TABLET | Freq: Four times a day (QID) | ORAL | Status: DC | PRN
  Administered 2018-07-03 – 2018-07-04 (×2): 50 mg via ORAL
  Filled 2018-07-03 (×2): qty 1

## 2018-07-03 MED ORDER — ROCURONIUM BROMIDE 10 MG/ML (PF) SYRINGE
PREFILLED_SYRINGE | INTRAVENOUS | Status: DC | PRN
  Administered 2018-07-03: 50 mg via INTRAVENOUS

## 2018-07-03 MED ORDER — MEPERIDINE HCL 50 MG/ML IJ SOLN: 6.2500 mg | INTRAMUSCULAR | Status: DC | PRN

## 2018-07-03 MED ORDER — CLINDAMYCIN PHOSPHATE 900 MG/50ML IV SOLN
900.0000 mg | INTRAVENOUS | Status: AC
  Administered 2018-07-03: 900 mg via INTRAVENOUS

## 2018-07-03 MED ORDER — KETAMINE HCL 10 MG/ML IJ SOLN
INTRAMUSCULAR | Status: DC | PRN
  Administered 2018-07-03: 30 mg via INTRAVENOUS

## 2018-07-03 MED ORDER — HEPARIN SODIUM (PORCINE) 5000 UNIT/ML IJ SOLN
INTRAMUSCULAR | Status: AC
  Administered 2018-07-03: 5000 [IU] via SUBCUTANEOUS
  Filled 2018-07-03: qty 1

## 2018-07-03 MED ORDER — ONDANSETRON HCL 4 MG/2ML IJ SOLN
INTRAMUSCULAR | Status: DC | PRN
  Administered 2018-07-03: 4 mg via INTRAVENOUS

## 2018-07-03 MED ORDER — ONDANSETRON HCL 4 MG/2ML IJ SOLN
4.0000 mg | Freq: Four times a day (QID) | INTRAMUSCULAR | Status: DC | PRN
  Administered 2018-07-04 – 2018-07-05 (×2): 4 mg via INTRAVENOUS
  Filled 2018-07-03 (×2): qty 2

## 2018-07-03 MED ORDER — GABAPENTIN 300 MG PO CAPS
300.0000 mg | ORAL_CAPSULE | Freq: Two times a day (BID) | ORAL | Status: DC
  Administered 2018-07-03 – 2018-07-05 (×5): 300 mg via ORAL
  Filled 2018-07-03 (×6): qty 1

## 2018-07-03 MED ORDER — SODIUM CHLORIDE 0.9 % IV SOLN: INTRAVENOUS | Status: DC | PRN

## 2018-07-03 MED ORDER — NAPROXEN 250 MG PO TABS
500.0000 mg | ORAL_TABLET | Freq: Every day | ORAL | Status: DC | PRN
  Filled 2018-07-03: qty 2

## 2018-07-03 MED ORDER — PANTOPRAZOLE SODIUM 40 MG PO TBEC
40.0000 mg | DELAYED_RELEASE_TABLET | Freq: Every day | ORAL | Status: DC
  Administered 2018-07-03 – 2018-07-04 (×2): 40 mg via ORAL
  Filled 2018-07-03 (×2): qty 1

## 2018-07-03 MED ORDER — SODIUM CHLORIDE 0.9 % IV SOLN
INTRAVENOUS | Status: DC | PRN
  Administered 2018-07-03: 50 ug/min via INTRAVENOUS

## 2018-07-03 MED ORDER — BUPIVACAINE HCL (PF) 0.25 % IJ SOLN
INTRAMUSCULAR | Status: AC
  Filled 2018-07-03: qty 30

## 2018-07-03 MED ORDER — EPHEDRINE SULFATE-NACL 50-0.9 MG/10ML-% IV SOSY
PREFILLED_SYRINGE | INTRAVENOUS | Status: DC | PRN
  Administered 2018-07-03: 20 mg via INTRAVENOUS
  Administered 2018-07-03: 10 mg via INTRAVENOUS
  Administered 2018-07-03: 5 mg via INTRAVENOUS
  Administered 2018-07-03: 15 mg via INTRAVENOUS

## 2018-07-03 MED ORDER — SUGAMMADEX SODIUM 200 MG/2ML IV SOLN
INTRAVENOUS | Status: DC | PRN
  Administered 2018-07-03: 153 mg via INTRAVENOUS

## 2018-07-03 MED ORDER — ONDANSETRON HCL 4 MG/2ML IJ SOLN
INTRAMUSCULAR | Status: AC
  Filled 2018-07-03: qty 2

## 2018-07-03 MED ORDER — FENTANYL CITRATE (PF) 250 MCG/5ML IJ SOLN
INTRAMUSCULAR | Status: DC | PRN
  Administered 2018-07-03 (×2): 50 ug via INTRAVENOUS
  Administered 2018-07-03: 100 ug via INTRAVENOUS

## 2018-07-03 MED ORDER — ACETAMINOPHEN 500 MG PO TABS
1000.0000 mg | ORAL_TABLET | Freq: Four times a day (QID) | ORAL | Status: DC
  Administered 2018-07-03 – 2018-07-06 (×8): 1000 mg via ORAL
  Filled 2018-07-03 (×8): qty 2

## 2018-07-03 MED ORDER — CLINDAMYCIN PHOSPHATE 900 MG/50ML IV SOLN
INTRAVENOUS | Status: AC
  Filled 2018-07-03: qty 50

## 2018-07-03 MED ORDER — CELECOXIB 200 MG PO CAPS: 200.0000 mg | ORAL_CAPSULE | Freq: Two times a day (BID) | ORAL | Status: DC

## 2018-07-03 MED ORDER — PROPOFOL 10 MG/ML IV BOLUS
INTRAVENOUS | Status: AC
  Filled 2018-07-03: qty 20

## 2018-07-03 MED ORDER — STERILE WATER FOR INJECTION IJ SOLN
INTRAMUSCULAR | Status: DC | PRN
  Administered 2018-07-03: 200 mL

## 2018-07-03 MED ORDER — ALPRAZOLAM 0.25 MG PO TABS: 0.2500 mg | ORAL_TABLET | Freq: Every day | ORAL | Status: DC | PRN

## 2018-07-03 MED ORDER — LACTATED RINGERS IV SOLN
INTRAVENOUS | Status: DC
  Administered 2018-07-03 (×2): via INTRAVENOUS

## 2018-07-03 MED ORDER — IBUPROFEN 800 MG PO TABS: 800.0000 mg | ORAL_TABLET | Freq: Three times a day (TID) | ORAL | 0 refills | Status: DC | PRN

## 2018-07-03 MED ORDER — LIDOCAINE 2% (20 MG/ML) 5 ML SYRINGE
INTRAMUSCULAR | Status: AC
  Filled 2018-07-03: qty 5

## 2018-07-03 MED ORDER — MIDAZOLAM HCL 2 MG/2ML IJ SOLN
INTRAMUSCULAR | Status: DC | PRN
  Administered 2018-07-03: 1 mg via INTRAVENOUS

## 2018-07-03 MED ORDER — PHENYLEPHRINE 40 MCG/ML (10ML) SYRINGE FOR IV PUSH (FOR BLOOD PRESSURE SUPPORT)
PREFILLED_SYRINGE | INTRAVENOUS | Status: DC | PRN
  Administered 2018-07-03 (×2): 160 ug via INTRAVENOUS
  Administered 2018-07-03: 80 ug via INTRAVENOUS

## 2018-07-03 MED ORDER — DIPHENHYDRAMINE HCL 12.5 MG/5ML PO ELIX
25.0000 mg | ORAL_SOLUTION | Freq: Every evening | ORAL | Status: DC | PRN
  Filled 2018-07-03: qty 10

## 2018-07-03 MED ORDER — BUPIVACAINE HCL (PF) 0.25 % IJ SOLN
INTRAMUSCULAR | Status: DC | PRN
  Administered 2018-07-03: 5 mL

## 2018-07-03 MED ORDER — DEXAMETHASONE SODIUM PHOSPHATE 10 MG/ML IJ SOLN
INTRAMUSCULAR | Status: AC
  Filled 2018-07-03: qty 1

## 2018-07-03 MED ORDER — KETAMINE HCL 50 MG/5ML IJ SOSY
PREFILLED_SYRINGE | INTRAMUSCULAR | Status: AC
  Filled 2018-07-03: qty 5

## 2018-07-03 MED ORDER — LEVOTHYROXINE SODIUM 25 MCG PO TABS
25.0000 ug | ORAL_TABLET | Freq: Every day | ORAL | Status: DC
  Administered 2018-07-04 – 2018-07-05 (×2): 25 ug via ORAL
  Filled 2018-07-03 (×2): qty 1

## 2018-07-03 MED ORDER — DEXMEDETOMIDINE HCL IN NACL 200 MCG/50ML IV SOLN
INTRAVENOUS | Status: DC | PRN
  Administered 2018-07-03: 8 ug via INTRAVENOUS

## 2018-07-03 MED ORDER — AMLODIPINE BESYLATE 5 MG PO TABS
5.0000 mg | ORAL_TABLET | Freq: Every day | ORAL | Status: DC
  Administered 2018-07-04: 5 mg via ORAL
  Filled 2018-07-03: qty 1

## 2018-07-03 MED ORDER — DEXTROSE-NACL 5-0.9 % IV SOLN
INTRAVENOUS | Status: DC
  Administered 2018-07-05: 02:00:00 via INTRAVENOUS

## 2018-07-03 MED ORDER — DEXAMETHASONE SODIUM PHOSPHATE 10 MG/ML IJ SOLN
INTRAMUSCULAR | Status: DC | PRN
  Administered 2018-07-03: 5 mg via INTRAVENOUS

## 2018-07-03 MED ORDER — VITAMIN D (ERGOCALCIFEROL) 1.25 MG (50000 UNIT) PO CAPS
50000.0000 [IU] | ORAL_CAPSULE | ORAL | Status: DC
  Filled 2018-07-03: qty 1

## 2018-07-03 MED ORDER — 0.9 % SODIUM CHLORIDE (POUR BTL) OPTIME
TOPICAL | Status: DC | PRN
  Administered 2018-07-03: 1000 mL

## 2018-07-03 MED ORDER — CELECOXIB 200 MG PO CAPS
ORAL_CAPSULE | ORAL | Status: AC
  Administered 2018-07-03: 400 mg via ORAL
  Filled 2018-07-03: qty 1

## 2018-07-03 MED ORDER — ENOXAPARIN SODIUM 40 MG/0.4ML ~~LOC~~ SOLN
40.0000 mg | SUBCUTANEOUS | Status: DC
  Administered 2018-07-04: 40 mg via SUBCUTANEOUS

## 2018-07-03 MED ORDER — LOSARTAN POTASSIUM 50 MG PO TABS
100.0000 mg | ORAL_TABLET | Freq: Every day | ORAL | Status: DC
  Administered 2018-07-03 – 2018-07-04 (×2): 100 mg via ORAL
  Filled 2018-07-03 (×2): qty 2

## 2018-07-03 MED ORDER — FENTANYL CITRATE (PF) 250 MCG/5ML IJ SOLN
INTRAMUSCULAR | Status: AC
  Filled 2018-07-03: qty 5

## 2018-07-03 MED ORDER — HYDROMORPHONE HCL 1 MG/ML IJ SOLN
INTRAMUSCULAR | Status: AC
  Filled 2018-07-03: qty 1

## 2018-07-03 SURGICAL SUPPLY — 50 items
APPLIER CLIP ROT 10 11.4 M/L (STAPLE)
BINDER ABDOMINAL 12 ML 46-62 (SOFTGOODS) ×2 IMPLANT
BLADE CLIPPER SURG (BLADE) IMPLANT
CANISTER SUCT 3000ML PPV (MISCELLANEOUS) ×2 IMPLANT
CHLORAPREP W/TINT 26ML (MISCELLANEOUS) ×2 IMPLANT
CLIP APPLIE ROT 10 11.4 M/L (STAPLE) IMPLANT
COVER SURGICAL LIGHT HANDLE (MISCELLANEOUS) ×2 IMPLANT
COVER WAND RF STERILE (DRAPES) ×2 IMPLANT
DERMABOND ADVANCED (GAUZE/BANDAGES/DRESSINGS) ×1
DERMABOND ADVANCED .7 DNX12 (GAUZE/BANDAGES/DRESSINGS) ×1 IMPLANT
DEVICE SECURE STRAP 25 ABSORB (INSTRUMENTS) ×6 IMPLANT
DEVICE TROCAR PUNCTURE CLOSURE (ENDOMECHANICALS) ×2 IMPLANT
ELECT CAUTERY BLADE 6.4 (BLADE) ×2 IMPLANT
ELECT REM PT RETURN 9FT ADLT (ELECTROSURGICAL) ×2
ELECTRODE REM PT RTRN 9FT ADLT (ELECTROSURGICAL) ×1 IMPLANT
GLOVE BIO SURGEON STRL SZ8 (GLOVE) ×2 IMPLANT
GLOVE BIOGEL PI IND STRL 8 (GLOVE) ×1 IMPLANT
GLOVE BIOGEL PI INDICATOR 8 (GLOVE) ×1
GOWN STRL REUS W/ TWL LRG LVL3 (GOWN DISPOSABLE) ×2 IMPLANT
GOWN STRL REUS W/ TWL XL LVL3 (GOWN DISPOSABLE) ×1 IMPLANT
GOWN STRL REUS W/TWL LRG LVL3 (GOWN DISPOSABLE) ×2
GOWN STRL REUS W/TWL XL LVL3 (GOWN DISPOSABLE) ×1
KIT BASIN OR (CUSTOM PROCEDURE TRAY) ×2 IMPLANT
KIT TURNOVER KIT B (KITS) ×2 IMPLANT
MARKER SKIN DUAL TIP RULER LAB (MISCELLANEOUS) ×2 IMPLANT
MESH VENTRALIGHT ST 4X6IN (Mesh General) ×2 IMPLANT
NS IRRIG 1000ML POUR BTL (IV SOLUTION) ×2 IMPLANT
PAD ARMBOARD 7.5X6 YLW CONV (MISCELLANEOUS) ×4 IMPLANT
PENCIL BUTTON HOLSTER BLD 10FT (ELECTRODE) ×2 IMPLANT
SCISSORS LAP 5X35 DISP (ENDOMECHANICALS) ×2 IMPLANT
SET IRRIG TUBING LAPAROSCOPIC (IRRIGATION / IRRIGATOR) IMPLANT
SET TUBE SMOKE EVAC HIGH FLOW (TUBING) ×2 IMPLANT
SHEARS HARMONIC ACE PLUS 36CM (ENDOMECHANICALS) IMPLANT
SLEEVE ENDOPATH XCEL 5M (ENDOMECHANICALS) ×6 IMPLANT
SUT MNCRL AB 4-0 PS2 18 (SUTURE) ×2 IMPLANT
SUT MON AB 4-0 PC3 18 (SUTURE) ×2 IMPLANT
SUT NOVA NAB DX-16 0-1 5-0 T12 (SUTURE) ×2 IMPLANT
SUT VIC AB 0 CT1 27 (SUTURE)
SUT VIC AB 0 CT1 27XBRD ANBCTR (SUTURE) IMPLANT
SUT VIC AB 3-0 SH 27 (SUTURE) ×1
SUT VIC AB 3-0 SH 27XBRD (SUTURE) ×1 IMPLANT
TOWEL OR 17X24 6PK STRL BLUE (TOWEL DISPOSABLE) ×2 IMPLANT
TOWEL OR 17X26 10 PK STRL BLUE (TOWEL DISPOSABLE) ×2 IMPLANT
TRAY FOLEY CATH SILVER 16FR (SET/KITS/TRAYS/PACK) ×2 IMPLANT
TRAY LAPAROSCOPIC MC (CUSTOM PROCEDURE TRAY) ×2 IMPLANT
TROCAR XCEL NON-BLD 11X100MML (ENDOMECHANICALS) ×2 IMPLANT
TROCAR XCEL NON-BLD 5MMX100MML (ENDOMECHANICALS) ×2 IMPLANT
TUBE CONNECTING 12X1/4 (SUCTIONS) ×2 IMPLANT
WATER STERILE IRR 1000ML POUR (IV SOLUTION) ×2 IMPLANT
YANKAUER SUCT BULB TIP NO VENT (SUCTIONS) ×2 IMPLANT

## 2018-07-03 NOTE — Progress Notes (Signed)
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 Dr. Brantley Stage made aware. New orders received.

## 2018-07-03 NOTE — Interval H&P Note (Signed)
History and Physical Interval Note:  07/03/2018 11:26 AM  Elizabeth Mcclain  has presented today for surgery, with the diagnosis of hernia incisional  The various methods of treatment have been discussed with the patient and family. After consideration of risks, benefits and other options for treatment, the patient has consented to  Procedure(s): [place] (N/A) as a surgical intervention .  The patient's history has been reviewed, patient examined, no change in status, stable for surgery.  I have reviewed the patient's chart and labs.  Questions were answered to the patient's satisfaction.     [place]

## 2018-07-03 NOTE — Op Note (Signed)
Preoperative diagnosis: Incisional hernia  Postoperative diagnosis: Same  Procedure: Laparoscopic repair of incisional hernia using 15 cm x 10 cm coated ventral Lex mesh  Surgeon: Erroll Luna, MD  Anesthesia: General with local anesthetic consisting of 0.25% Sensorcaine and Exparel 20 cc diluted by 20 cc of saline  Drains: None  EBL: 20 cc  Specimen none  IV fluids: Per anesthesia record  Indications for procedure: The patient is a 73 year old female with history of an umbilical hernia.  Mesh 2 years ago.  She is recurred around the mesh and has a hernia by CT scanning and evaluation by physical exam.  We discussed options but felt laparoscopic approach would give a broader repair given her obesity and weakened midline.  Risk, benefits and other options discussed including observation.The risk of hernia repair include bleeding,  Infection,   Recurrence of the hernia,  Mesh use, chronic pain,  Organ injury,  Bowel injury,  Bladder injury,   nerve injury with numbness around the incision,  Death,  and worsening of preexisting  medical problems.  The alternatives to surgery have been discussed as well..  Long term expectations of both operative and non operative treatments have been discussed.   The patient agrees to proceed.   Description of procedure: The patient was met in the holding area and questions were answered.  She was taken back the operative room placed supine upon the operating room table.  After induction of general anesthesia, Foley catheter was placed under sterile condition and the abdomen was prepped and draped in sterile fashion.  Timeout was performed.  Antibiotics were administered.  Local anesthetic was infiltrated in the left upper quadrant for 1 cm.  Incision was made in the Optiview 5 mm port was used with the camera to guide insertion into the abdominal cavity without injury.  Pneumoperitoneum was then created to 15 mmHg of CO2 pressure laparoscope was placed.  Upon  examination there is some omentum stuck in his small 3 cm hernia just lateral to the mesh from previous umbilical hernia repair.  I placed a 5 mm left lower quadrant port and a third right upper quadrant port consisting of a 5 mm port.  We then grasped the omentum and pulled out the hernia.  I measured the fascial defect.  A small incision was made over the hernia sac this was opened.  A 15 cm x 10 cm piece of coated ventral Lex mesh was used and sutures were placed on the rough side of the mesh.  Four #1 Novafil sutures were placed for traction.  This was then moistened and placed through the fascial defect in the abdominal cavity.  We then closed the fascial opening with 0 Vicryl.  We closed the skin with 0 Vicryl and 4-0 Monocryl.  We then unfurled the mesh.  Suture passer was used to grab the 4 sutures and pull them to the 4 separate quadrants respectively and secured them.  When this was pulled tight there is a nice flat piece of mesh with out undue tension.  These were all tied down.  An absorbable tacker was used and the edges of the mesh were tacked circumferentially in 2 layers.  This laid nicely with no bleeding or pleating.  Hemostasis was excellent.  Inspection of the intra-abdominal contents revealed no injury to the internal viscera.  At this point time the trochars removed allowed the CO2 to escape.  4-0 Monocryl was used to close all skin ports.  Dermabond applied.  All counts  found to be correct.  Abdominal binder placed.  The patient was then awoke extubated taken to recovery in satisfactory condition.

## 2018-07-03 NOTE — Anesthesia Postprocedure Evaluation (Signed)
Anesthesia Post Note  Patient: Elizabeth Mcclain  Procedure(s) Performed: LAPAROSCOPIC INCISIONAL HERNIA REPAIR WITH MESH (N/A Abdomen)     Patient location during evaluation: PACU Anesthesia Type: General Level of consciousness: awake and alert Pain management: pain level controlled Vital Signs Assessment: post-procedure vital signs reviewed and stable Respiratory status: spontaneous breathing, nonlabored ventilation and respiratory function stable Cardiovascular status: blood pressure returned to baseline and stable Postop Assessment: no apparent nausea or vomiting Anesthetic complications: no                 Brennan Bailey

## 2018-07-03 NOTE — Anesthesia Preprocedure Evaluation (Signed)
Anesthesia Evaluation  Patient identified by MRN, date of birth, ID band Patient awake    Reviewed: Allergy & Precautions, NPO status , Patient's Chart, lab work & pertinent test results  Airway Mallampati: II  TM Distance: >3 FB Neck ROM: Full    Dental no notable dental hx.    Pulmonary neg pulmonary ROS,    Pulmonary exam normal breath sounds clear to auscultation       Cardiovascular hypertension, Pt. on medications Normal cardiovascular exam Rhythm:Regular Rate:Normal     Neuro/Psych PSYCHIATRIC DISORDERS Anxiety Depression negative neurological ROS     GI/Hepatic Neg liver ROS, GERD  ,  Endo/Other  negative endocrine ROSHypothyroidism   Renal/GU negative Renal ROS     Musculoskeletal negative musculoskeletal ROS (+)   Abdominal (+) + obese,   Peds  Hematology negative hematology ROS (+)   Anesthesia Other Findings   Reproductive/Obstetrics negative OB ROS                             Anesthesia Physical  Anesthesia Plan  ASA: II  Anesthesia Plan: General   Post-op Pain Management:    Induction: Intravenous  PONV Risk Score and Plan: 3 and Ondansetron, Dexamethasone, Midazolam and Treatment may vary due to age or medical condition  Airway Management Planned: Oral ETT  Additional Equipment:   Intra-op Plan:   Post-operative Plan: Extubation in OR  Informed Consent: I have reviewed the patients History and Physical, chart, labs and discussed the procedure including the risks, benefits and alternatives for the proposed anesthesia with the patient or authorized representative who has indicated his/her understanding and acceptance.     Dental advisory given  Plan Discussed with: CRNA  Anesthesia Plan Comments:         Anesthesia Quick Evaluation

## 2018-07-03 NOTE — Anesthesia Procedure Notes (Signed)
Procedure Name: Intubation Date/Time: 07/03/2018 1:03 PM Performed by: Alain Marion, CRNA Pre-anesthesia Checklist: Patient identified, Emergency Drugs available, Suction available and Patient being monitored Patient Re-evaluated:Patient Re-evaluated prior to induction Oxygen Delivery Method: Circle System Utilized Preoxygenation: Pre-oxygenation with 100% oxygen Induction Type: IV induction Ventilation: Mask ventilation without difficulty and Oral airway inserted - appropriate to patient size Laryngoscope Size: Sabra Heck and 2 Grade View: Grade I Tube type: Oral Tube size: 7.0 mm Number of attempts: 1 Airway Equipment and Method: Stylet and Oral airway Placement Confirmation: ETT inserted through vocal cords under direct vision,  positive ETCO2 and breath sounds checked- equal and bilateral Secured at: 21 cm Tube secured with: Tape Dental Injury: Teeth and Oropharynx as per pre-operative assessment

## 2018-07-03 NOTE — Discharge Instructions (Signed)
[facility] _______[facility] ° °UMBILICAL OR INGUINAL HERNIA REPAIR: POST OP INSTRUCTIONS ° °Always review your discharge instruction sheet given to you by the facility where your surgery was performed. °IF YOU HAVE DISABILITY OR FAMILY LEAVE FORMS, YOU MUST BRING THEM TO THE OFFICE FOR PROCESSING.   °DO NOT GIVE THEM TO YOUR DOCTOR. ° °1. A  prescription for pain medication may be given to you upon discharge.  Take your pain medication as prescribed, if needed.  If narcotic pain medicine is not needed, then you may take acetaminophen (Tylenol) or ibuprofen (Advil) as needed. °2. Take your usually prescribed medications unless otherwise directed. °If you need a refill on your pain medication, please contact your pharmacy.  They will contact our office to request authorization. Prescriptions will not be filled after 5 pm or on week-ends. °3. You should follow a light diet the first 24 hours after arrival home, such as soup and crackers, etc.  Be sure to include lots of fluids daily.  Resume your normal diet the day after surgery. °4.Most patients will experience some swelling and bruising around the umbilicus or in the groin and scrotum.  Ice packs and reclining will help.  Swelling and bruising can take several days to resolve.  °6. It is common to experience some constipation if taking pain medication after surgery.  Increasing fluid intake and taking a stool softener (such as Colace) will usually help or prevent this problem from occurring.  A mild laxative (Milk of Magnesia or Miralax) should be taken according to package directions if there are no bowel movements after 48 hours. °7. Unless discharge instructions indicate otherwise, you may remove your bandages 24-48 hours after surgery, and you may shower at that time.  You may have steri-strips (small skin tapes) in place directly over the incision.  These strips should be left on the skin for 7-10 days.  If your surgeon used skin glue on the  incision, you may shower in 24 hours.  The glue will flake off over the next 2-3 weeks.  Any sutures or staples will be removed at the office during your follow-up visit. °8. ACTIVITIES:  You may resume regular (light) daily activities beginning the next day--such as daily self-care, walking, climbing stairs--gradually increasing activities as tolerated.  You may have sexual intercourse when it is comfortable.  Refrain from any heavy lifting or straining until approved by your doctor. ° °a.You may drive when you are no longer taking prescription pain medication, you can comfortably wear a seatbelt, and you can safely maneuver your car and apply brakes. °b.RETURN TO WORK:   °_____________________________________________ ° °9.You should see your doctor in the office for a follow-up appointment approximately 2-3 weeks after your surgery.  Make sure that you call for this appointment within a day or two after you arrive home to insure a convenient appointment time. °10.OTHER INSTRUCTIONS: _________________________ °   _____________________________________ ° °WHEN TO CALL YOUR DOCTOR: °1. Fever over 101.0 °2. Inability to urinate °3. Nausea and/or vomiting °4. Extreme swelling or bruising °5. Continued bleeding from incision. °6. Increased pain, redness, or drainage from the incision ° °The clinic staff is available to answer your questions during regular business hours.  Please don’t hesitate to call and ask to speak to one of the nurses for clinical concerns.  If you have a medical emergency, go to the nearest emergency room or call 911.  A surgeon from [facility] is always on call at the hospital ° ° °  [address], [place], [place], [place]  [address] ?  [address], [place], [place]   [address] [phone] ? [phone] ? FAX [phone] Web site: [email or web address]   .Arvil Persons

## 2018-07-03 NOTE — Transfer of Care (Signed)
Immediate Anesthesia Transfer of Care Note  Patient: Elizabeth Mcclain  Procedure(s) Performed: LAPAROSCOPIC INCISIONAL HERNIA REPAIR WITH MESH (N/A Abdomen)  Patient Location: PACU  Anesthesia Type:General  Level of Consciousness: awake, alert  and oriented  Airway & Oxygen Therapy: Patient Spontanous Breathing and Patient connected to face mask oxygen  Post-op Assessment: Report given to RN and Post -op Vital signs reviewed and stable  Post vital signs: Reviewed and stable  Last Vitals:  Vitals Value Taken Time  BP 117/58 07/03/2018  2:51 PM  Temp    Pulse 77 07/03/2018  2:53 PM  Resp 16 07/03/2018  2:53 PM  SpO2 85 % 07/03/2018  2:53 PM  Vitals shown include unvalidated device data.  Last Pain:  Vitals:   07/03/18 1133  TempSrc: Oral  PainSc: 0-No pain         Complications: No apparent anesthesia complications

## 2018-07-03 NOTE — Progress Notes (Signed)
Patient admitted to [facility]. S/P laparoscopic incisional hernia repair with mesh. Abdominal binder intact.  IV infusing. Oriented to room. Call bell in reach. Family called to come to bedside.

## 2018-07-04 ENCOUNTER — Inpatient Hospital Stay (HOSPITAL_COMMUNITY): Payer: Medicare Other

## 2018-07-04 ENCOUNTER — Encounter (HOSPITAL_COMMUNITY): Payer: Self-pay | Admitting: Surgery

## 2018-07-04 DIAGNOSIS — J9601 Acute respiratory failure with hypoxia: Secondary | ICD-10-CM | POA: Diagnosis not present

## 2018-07-04 DIAGNOSIS — E669 Obesity, unspecified: Secondary | ICD-10-CM | POA: Diagnosis present

## 2018-07-04 DIAGNOSIS — J9690 Respiratory failure, unspecified, unspecified whether with hypoxia or hypercapnia: Secondary | ICD-10-CM | POA: Diagnosis not present

## 2018-07-04 DIAGNOSIS — B961 Klebsiella pneumoniae [K. pneumoniae] as the cause of diseases classified elsewhere: Secondary | ICD-10-CM | POA: Diagnosis not present

## 2018-07-04 DIAGNOSIS — R740 Nonspecific elevation of levels of transaminase and lactic acid dehydrogenase [LDH]: Secondary | ICD-10-CM | POA: Diagnosis not present

## 2018-07-04 DIAGNOSIS — R0602 Shortness of breath: Secondary | ICD-10-CM | POA: Diagnosis not present

## 2018-07-04 DIAGNOSIS — R0902 Hypoxemia: Secondary | ICD-10-CM | POA: Diagnosis not present

## 2018-07-04 DIAGNOSIS — R109 Unspecified abdominal pain: Secondary | ICD-10-CM | POA: Diagnosis not present

## 2018-07-04 DIAGNOSIS — K567 Ileus, unspecified: Secondary | ICD-10-CM | POA: Diagnosis not present

## 2018-07-04 DIAGNOSIS — D649 Anemia, unspecified: Secondary | ICD-10-CM | POA: Diagnosis not present

## 2018-07-04 DIAGNOSIS — R05 Cough: Secondary | ICD-10-CM | POA: Diagnosis not present

## 2018-07-04 DIAGNOSIS — R34 Anuria and oliguria: Secondary | ICD-10-CM | POA: Diagnosis not present

## 2018-07-04 DIAGNOSIS — A419 Sepsis, unspecified organism: Secondary | ICD-10-CM | POA: Diagnosis not present

## 2018-07-04 DIAGNOSIS — R918 Other nonspecific abnormal finding of lung field: Secondary | ICD-10-CM | POA: Diagnosis not present

## 2018-07-04 DIAGNOSIS — R6521 Severe sepsis with septic shock: Secondary | ICD-10-CM | POA: Diagnosis not present

## 2018-07-04 DIAGNOSIS — K432 Incisional hernia without obstruction or gangrene: Secondary | ICD-10-CM | POA: Diagnosis not present

## 2018-07-04 DIAGNOSIS — G9341 Metabolic encephalopathy: Secondary | ICD-10-CM | POA: Diagnosis not present

## 2018-07-04 DIAGNOSIS — R1031 Right lower quadrant pain: Secondary | ICD-10-CM | POA: Diagnosis not present

## 2018-07-04 DIAGNOSIS — I952 Hypotension due to drugs: Secondary | ICD-10-CM | POA: Diagnosis not present

## 2018-07-04 DIAGNOSIS — E874 Mixed disorder of acid-base balance: Secondary | ICD-10-CM | POA: Diagnosis not present

## 2018-07-04 DIAGNOSIS — J690 Pneumonitis due to inhalation of food and vomit: Secondary | ICD-10-CM | POA: Diagnosis not present

## 2018-07-04 DIAGNOSIS — E039 Hypothyroidism, unspecified: Secondary | ICD-10-CM | POA: Diagnosis not present

## 2018-07-04 DIAGNOSIS — Z6839 Body mass index (BMI) 39.0-39.9, adult: Secondary | ICD-10-CM | POA: Diagnosis not present

## 2018-07-04 DIAGNOSIS — N179 Acute kidney failure, unspecified: Secondary | ICD-10-CM | POA: Diagnosis not present

## 2018-07-04 DIAGNOSIS — Z4682 Encounter for fitting and adjustment of non-vascular catheter: Secondary | ICD-10-CM | POA: Diagnosis not present

## 2018-07-04 DIAGNOSIS — J9811 Atelectasis: Secondary | ICD-10-CM | POA: Diagnosis not present

## 2018-07-04 DIAGNOSIS — D6851 Activated protein C resistance: Secondary | ICD-10-CM | POA: Diagnosis present

## 2018-07-04 DIAGNOSIS — I10 Essential (primary) hypertension: Secondary | ICD-10-CM | POA: Diagnosis not present

## 2018-07-04 DIAGNOSIS — F418 Other specified anxiety disorders: Secondary | ICD-10-CM | POA: Diagnosis present

## 2018-07-04 DIAGNOSIS — E871 Hypo-osmolality and hyponatremia: Secondary | ICD-10-CM | POA: Diagnosis not present

## 2018-07-04 DIAGNOSIS — E876 Hypokalemia: Secondary | ICD-10-CM | POA: Diagnosis not present

## 2018-07-04 DIAGNOSIS — R0603 Acute respiratory distress: Secondary | ICD-10-CM | POA: Diagnosis not present

## 2018-07-04 LAB — CBC
HCT: 38.4 % (ref 36.0–46.0)
Hemoglobin: 12.1 g/dL (ref 12.0–15.0)
MCH: 28.6 pg (ref 26.0–34.0)
MCHC: 31.5 g/dL (ref 30.0–36.0)
MCV: 90.8 fL (ref 80.0–100.0)
Platelets: 327 10*3/uL (ref 150–400)
RBC: 4.23 MIL/uL (ref 3.87–5.11)
RDW: 14.6 % (ref 11.5–15.5)
WBC: 19.8 10*3/uL — ABNORMAL HIGH (ref 4.0–10.5)
nRBC: 0 % (ref 0.0–0.2)

## 2018-07-04 LAB — URINALYSIS, ROUTINE W REFLEX MICROSCOPIC
BILIRUBIN URINE: NEGATIVE
GLUCOSE, UA: NEGATIVE mg/dL
HGB URINE DIPSTICK: NEGATIVE
Ketones, ur: NEGATIVE mg/dL
Leukocytes,Ua: NEGATIVE
Nitrite: NEGATIVE
Protein, ur: NEGATIVE mg/dL
Specific Gravity, Urine: 1.019 (ref 1.005–1.030)
pH: 5 (ref 5.0–8.0)

## 2018-07-04 LAB — BASIC METABOLIC PANEL
ANION GAP: 16 — AB (ref 5–15)
BUN: 40 mg/dL — ABNORMAL HIGH (ref 8–23)
CALCIUM: 9.3 mg/dL (ref 8.9–10.3)
CO2: 21 mmol/L — AB (ref 22–32)
Chloride: 101 mmol/L (ref 98–111)
Creatinine, Ser: 2.7 mg/dL — ABNORMAL HIGH (ref 0.44–1.00)
GFR calc non Af Amer: 17 mL/min — ABNORMAL LOW (ref >60)
GFR, EST AFRICAN AMERICAN: 20 mL/min — AB (ref >60)
Glucose, Bld: 144 mg/dL — ABNORMAL HIGH (ref 70–99)
Potassium: 4 mmol/L (ref 3.5–5.1)
Sodium: 138 mmol/L (ref 135–145)

## 2018-07-04 LAB — SODIUM, URINE, RANDOM: Sodium, Ur: <10 mmol/L

## 2018-07-04 LAB — HEMOGLOBIN AND HEMATOCRIT, BLOOD
HCT: 34 % — ABNORMAL LOW (ref 36.0–46.0)
Hemoglobin: 10.8 g/dL — ABNORMAL LOW (ref 12.0–15.0)

## 2018-07-04 LAB — CREATININE, URINE, RANDOM: Creatinine, Urine: 170.58 mg/dL

## 2018-07-04 MED ORDER — FUROSEMIDE 10 MG/ML IJ SOLN
20.0000 mg | Freq: Once | INTRAMUSCULAR | Status: AC
  Administered 2018-07-04: 20 mg via INTRAVENOUS
  Filled 2018-07-04: qty 2

## 2018-07-04 MED ORDER — TAMSULOSIN HCL 0.4 MG PO CAPS
0.4000 mg | ORAL_CAPSULE | Freq: Every day | ORAL | Status: DC
  Administered 2018-07-04: 0.4 mg via ORAL
  Filled 2018-07-04 (×3): qty 1

## 2018-07-04 MED ORDER — FUROSEMIDE 20 MG PO TABS: 20.0000 mg | ORAL_TABLET | Freq: Every day | ORAL | Status: DC

## 2018-07-04 MED ORDER — SODIUM CHLORIDE 0.9 % IV BOLUS
1000.0000 mL | Freq: Once | INTRAVENOUS | Status: AC
  Administered 2018-07-04: 1000 mL via INTRAVENOUS

## 2018-07-04 MED ORDER — ALBUTEROL SULFATE (2.5 MG/3ML) 0.083% IN NEBU
2.5000 mg | INHALATION_SOLUTION | RESPIRATORY_TRACT | Status: DC | PRN
  Administered 2018-07-04 – 2018-07-11 (×5): 2.5 mg via RESPIRATORY_TRACT
  Filled 2018-07-04 (×6): qty 3

## 2018-07-04 MED ORDER — DEXTROSE-NACL 5-0.9 % IV SOLN
INTRAVENOUS | Status: DC
  Administered 2018-07-04: 08:00:00 via INTRAVENOUS

## 2018-07-04 MED ORDER — ENOXAPARIN SODIUM 30 MG/0.3ML ~~LOC~~ SOLN: 30.0000 mg | SUBCUTANEOUS | Status: DC

## 2018-07-04 MED ORDER — ENOXAPARIN SODIUM 40 MG/0.4ML ~~LOC~~ SOLN
40.0000 mg | SUBCUTANEOUS | Status: DC
  Administered 2018-07-04: 40 mg via SUBCUTANEOUS
  Filled 2018-07-04: qty 0.4

## 2018-07-04 MED ORDER — HYDROCODONE-ACETAMINOPHEN 5-325 MG PO TABS
1.0000 | ORAL_TABLET | ORAL | Status: DC | PRN
  Administered 2018-07-04 (×2): 2 via ORAL
  Filled 2018-07-04 (×2): qty 2

## 2018-07-04 MED ORDER — PNEUMOCOCCAL VAC POLYVALENT 25 MCG/0.5ML IJ INJ
0.5000 mL | INJECTION | INTRAMUSCULAR | Status: DC
  Filled 2018-07-04: qty 0.5

## 2018-07-04 NOTE — Progress Notes (Signed)
Called to the room to assess patient needing a neb treatment. Patient states that she is not SOB, BBS to auscultation reveals no wheezing at this time. Some coarse aeration noted. Patient REFUSE neb at this time. RRT will monitor as needed.

## 2018-07-04 NOTE — Consult Note (Signed)
Reason for Consult: Renal failure Referring Physician:  Dr. Erroll Luna  Chief Complaint:  Hernia repair  Assessment/Plan: 1. Acute kidney injury - Clinically she appears to be hypovolemic and dyspnea appears related to pain with deep inspiration post surgery. Bladder scan last night only 28m but she has been voiding more frequently. Certainly possible she has ATN from hypotensive episodes during the surgery + Lasix + HCTZ + Celebrex + Losartan. - Would continue fluids for now with strict I&O's; will d/c the fluids if she's not voiding much. - Hold the Losartan for now with AKI; no further Lasix for now.  - Will send urine studies. - White count with what is relative hypotension for her is concerning. - Repeat bladder scan. 2. HTN - may cont amlodipine for now but with d/c losartan. Cont holding the HCTZ. BP again on low side and would like a higher perfusion pressure. 3. S/P lap hernia repair with mesh. 4. Hypothyroidism    HPI: Elizabeth GABLEis an 73y.o. female HTN, Hypothyroidism with an umbilical hernia repair w/ mesh 2 years ago noted to have a bulge with mild discomfort at the site and possible increase in size. Subsequent CT showed inflammation and hernia. The operation was non eventful with anesthesia time of ~1hr 30 min with BP initially of 155/75 and 2 episodes of hypotension w/ MAP that dropped to almost 50's at 2 separate occasions with phenylephrine and ephedrine initiated for a relatively short period of time  a short period of time. She also received Celebrex + Lasix + HCTZ + Losartan. Creatinine BL was 0.91 2/10 and before surgery was 1.32 and on day on consultation 2.7. UOP has been negligible and she is +2315 ml during this hospitalization.  ROS Pertinent items are noted in HPI.  Chemistry and CBC: Creatinine, Ser  Date/Time Value Ref Range Status  07/04/2018 07:06 AM 2.70 (H) 0.44 - 1.00 mg/dL Final    Comment:    REPEATED TO VERIFY  07/03/2018 07:20 PM 1.32  (H) 0.44 - 1.00 mg/dL Final  07/01/2018 09:33 AM 0.91 0.44 - 1.00 mg/dL Final  05/29/2016 11:05 AM 0.82 0.44 - 1.00 mg/dL Final  10/07/2007 11:17 AM 0.9  Final  07/24/2007 09:02 AM 0.70  Final   Recent Labs  Lab 07/01/18 0933 07/03/18 1920 07/04/18 0706  NA 141  --  138  K 4.2  --  4.0  CL 105  --  101  CO2 26  --  21*  GLUCOSE 106*  --  144*  BUN 16  --  40*  CREATININE 0.91 1.32* 2.70*  CALCIUM 9.6  --  9.3   Recent Labs  Lab 07/01/18 0933 07/03/18 1920 07/04/18 0706  WBC 8.8 11.3* 19.8*  NEUTROABS 5.1  --   --   HGB 13.5 11.8* 12.1  HCT 42.4 37.2 38.4  MCV 92.8 90.1 90.8  PLT 277 270 327   Liver Function Tests: Recent Labs  Lab 07/01/18 0933  AST 23  ALT 23  ALKPHOS 76  BILITOT 0.7  PROT 7.3  ALBUMIN 4.1   No results for input(s): LIPASE, AMYLASE in the last 168 hours. No results for input(s): AMMONIA in the last 168 hours. Cardiac Enzymes: No results for input(s): CKTOTAL, CKMB, CKMBINDEX, TROPONINI in the last 168 hours. Iron Studies: No results for input(s): IRON, TIBC, TRANSFERRIN, FERRITIN in the last 72 hours. PT/INR: '@LABRCNTIP'$ (inr:5)  Xrays/Other Studies: ) Results for orders placed or performed during the hospital encounter of 07/03/18 (from the past  48 hour(s))  CBC     Status: Abnormal   Collection Time: 07/03/18  7:20 PM  Result Value Ref Range   WBC 11.3 (H) 4.0 - 10.5 K/uL   RBC 4.13 3.87 - 5.11 MIL/uL   Hemoglobin 11.8 (L) 12.0 - 15.0 g/dL   HCT 37.2 36.0 - 46.0 %   MCV 90.1 80.0 - 100.0 fL   MCH 28.6 26.0 - 34.0 pg   MCHC 31.7 30.0 - 36.0 g/dL   RDW 14.3 11.5 - 15.5 %   Platelets 270 150 - 400 K/uL   nRBC 0.0 0.0 - 0.2 %    Comment: Performed at [facility], [place] [address], [place], [place] [address]  Creatinine, serum     Status: Abnormal   Collection Time: 07/03/18  7:20 PM  Result Value Ref Range   Creatinine, Ser 1.32 (H) 0.44 - 1.00 mg/dL   GFR calc non Af Amer 40 (L) >60 mL/min   GFR calc Af Amer 47 (L) >60  mL/min    Comment: Performed at [facility] [address], [place], [place] [address]  Basic metabolic panel     Status: Abnormal   Collection Time: 07/04/18  7:06 AM  Result Value Ref Range   Sodium 138 135 - 145 mmol/L   Potassium 4.0 3.5 - 5.1 mmol/L   Chloride 101 98 - 111 mmol/L   CO2 21 (L) 22 - 32 mmol/L   Glucose, Bld 144 (H) 70 - 99 mg/dL   BUN 40 (H) 8 - 23 mg/dL   Creatinine, Ser 2.70 (H) 0.44 - 1.00 mg/dL    Comment: REPEATED TO VERIFY   Calcium 9.3 8.9 - 10.3 mg/dL   GFR calc non Af Amer 17 (L) >60 mL/min   GFR calc Af Amer 20 (L) >60 mL/min   Anion gap 16 (H) 5 - 15    Comment: Performed at [facility], [place] [address] [place], [place] [address]  CBC     Status: Abnormal   Collection Time: 07/04/18  7:06 AM  Result Value Ref Range   WBC 19.8 (H) 4.0 - 10.5 K/uL   RBC 4.23 3.87 - 5.11 MIL/uL   Hemoglobin 12.1 12.0 - 15.0 g/dL   HCT 38.4 36.0 - 46.0 %   MCV 90.8 80.0 - 100.0 fL   MCH 28.6 26.0 - 34.0 pg   MCHC 31.5 30.0 - 36.0 g/dL   RDW 14.6 11.5 - 15.5 %   Platelets 327 150 - 400 K/uL   nRBC 0.0 0.0 - 0.2 %    Comment: Performed at [facility], [place] [address], [place], [place] [address]   No results found.  PMH:   Past Medical History:  Diagnosis Date  . Anxiety   . Arthritis   . Blood dyscrasia    Factor V Leiden  . Cancer ([place])    skin  . Depression   . GERD (gastroesophageal reflux disease)    takes tums or rolaids  . Hypertension   . Hypothyroidism   . Occasional tremors   . Reflux   . Thyroid disease     PSH:   Past Surgical History:  Procedure Laterality Date  . ABDOMINAL HYSTERECTOMY     partial  . BLADDER REPAIR    . CHOLECYSTECTOMY    . INCISIONAL HERNIA REPAIR N/A 07/03/2018   Procedure: LAPAROSCOPIC INCISIONAL HERNIA REPAIR WITH MESH;  Surgeon: Erroll Luna, MD;  Location: [facility];  Service: General;  Laterality: N/A;  . INSERTION OF MESH N/A 05/30/2016   Procedure: INSERTION OF MESH;  Surgeon: Erroll Luna, MD;  Location: [facility];  Service: General;  Laterality: N/A;  . KNEE SURGERY Bilateral   . RECTOCELE REPAIR    . SHOULDER ARTHROSCOPY Bilateral   . UMBILICAL HERNIA REPAIR N/A 05/30/2016   Procedure: UMBILICAL HERNIA REPAIR;  Surgeon: Erroll Luna, MD;  Location: [facility];  Service: General;  Laterality: N/A;    Allergies:  Allergies  Allergen Reactions  . Oxycodone-Acetaminophen Nausea And Vomiting  . Penicillins     rash Did it involve swelling of the face/tongue/throat, SOB, or low BP? No Did it involve sudden or severe rash/hives, skin peeling, or any reaction on the inside of your mouth or nose? No Did you need to seek medical attention at a hospital or doctor's office? No When did it last happen?15+ years If all above answers are "NO", may proceed with cephalosporin use.     Medications:   Prior to Admission medications   Medication Sig Start Date End Date Taking? Authorizing Provider  ALPRAZolam Duanne Moron) 0.25 MG tablet Take 0.25 mg by mouth daily as needed for anxiety.  07/22/15  Yes [provider]  amLODipine (NORVASC) 5 MG tablet Take 5 mg by mouth daily.   Yes [provider]  hydrochlorothiazide (HYDRODIURIL) 25 MG tablet Take 25 mg by mouth daily.  07/21/15  Yes [provider]  levothyroxine (SYNTHROID, LEVOTHROID) 25 MCG tablet Take 25 mcg by mouth daily. 06/02/15  Yes [provider]  losartan (COZAAR) 100 MG tablet Take 100 mg by mouth daily. 07/06/15  Yes [provider]  Multiple Vitamin (MULTIVITAMIN WITH MINERALS) TABS tablet Take 1 tablet by mouth daily.   Yes [provider]  naproxen (NAPROSYN) 500 MG tablet Take 500 mg by mouth daily as needed for moderate pain.   Yes [provider]  omeprazole (PRILOSEC) 20 MG capsule Take 20 mg by mouth daily.   Yes [provider]  PARoxetine (PAXIL) 40 MG tablet Take 40 mg by mouth daily.  06/02/15  Yes  [provider]  solifenacin (VESICARE) 5 MG tablet Take 5 mg by mouth at bedtime.   Yes [provider]  vitamin B-12 (CYANOCOBALAMIN) 1000 MCG tablet Take 1,000 mcg by mouth daily.   Yes [provider]  Vitamin D, Ergocalciferol, (DRISDOL) 50000 units CAPS capsule Take 50,000 Units by mouth every Sunday.  08/03/15  Yes [provider]  celecoxib (CELEBREX) 200 MG capsule Take 1 capsule (200 mg total) by mouth 2 (two) times daily. Patient not taking: Reported on 06/25/2018 05/30/16   Erroll Luna, MD  diphenhydrAMINE HCl (ZZZQUIL) 50 MG/30ML LIQD Take 50 mg by mouth at bedtime as needed (sleep).    [provider]  ibuprofen (ADVIL,MOTRIN) 800 MG tablet Take 1 tablet (800 mg total) by mouth every 8 (eight) hours as needed. 07/03/18   Cornett, [name], MD  ondansetron (ZOFRAN) 4 MG tablet Take 1 tablet (4 mg total) by mouth every 8 (eight) hours as needed for nausea or vomiting. 07/03/18   Cornett, [name], MD  oxyCODONE (OXY IR/ROXICODONE) 5 MG immediate release tablet Take 1 tablet (5 mg total) by mouth every 6 (six) hours as needed for severe pain. 07/03/18   Erroll Luna, MD    Discontinued Meds:   Medications Discontinued During This Encounter  Medication Reason  . HYDROcodone-acetaminophen (NORCO) 5-325 MG tablet Completed Course  . phentermine (ADIPEX-P) 37.5 MG  tablet Patient Preference  . sterile water (preservative free) injection Patient Discharge  . sodium chloride (PF) 0.9 % injection Patient Discharge  . bupivacaine (PF) (MARCAINE) 0.25 % injection Patient Discharge  . 0.9 % irrigation (POUR BTL) Patient Discharge  . Chlorhexidine Gluconate Cloth 2 % PADS 6 each Patient Transfer  . Chlorhexidine Gluconate Cloth 2 % PADS 6 each Patient Transfer  . lactated ringers infusion Patient Transfer  . HYDROmorphone (DILAUDID) injection 0.25-0.5 mg Patient Transfer  . meperidine (DEMEROL) injection 6.25-12.5 mg Patient Transfer  . promethazine  (PHENERGAN) injection 6.25-12.5 mg Patient Transfer  . diphenhydrAMINE HCl LIQD 50 mg P&T Policy: Therapeutic Substitute  . celecoxib (CELEBREX) capsule 200 mg Patient has not taken in last 30 days  . furosemide (LASIX) tablet 20 mg   . enoxaparin (LOVENOX) injection 40 mg   . hydrochlorothiazide (HYDRODIURIL) tablet 25 mg   . celecoxib (CELEBREX) capsule 200 mg Lab parameters not met  . enoxaparin (LOVENOX) injection 40 mg     Social History:  reports that she has never smoked. She has never used smokeless tobacco. She reports that she does not drink alcohol or use drugs.  Family History:  History reviewed. No pertinent family history.  Blood pressure 122/86, pulse 86, temperature 98.7 F (37.1 C), temperature source Oral, resp. rate 16, height '4\' 11"'$  (1.499 m), weight 76.5 kg, SpO2 93 %. General appearance: alert, cooperative and appears stated age Head: Normocephalic, without obvious abnormality, atraumatic Eyes: negative Neck: no adenopathy, no carotid bruit, no JVD, supple, symmetrical, trachea midline and thyroid not enlarged, symmetric, no tenderness/mass/nodules Back: symmetric, no curvature. ROM normal. No CVA tenderness. Resp: clear to auscultation bilaterally Chest wall: no tenderness Cardio: regular rate and rhythm, S1, S2 normal, no murmur, click, rub or gallop GI: no bs Extremities: extremities normal, atraumatic, no cyanosis or edema Pulses: 2+ and symmetric Skin: Skin color, texture, turgor normal. No rashes or lesions Lymph nodes: Cervical, supraclavicular, and axillary nodes normal. Neurologic: Grossly normal       Kimball Manske, Hunt Oris, MD 07/04/2018, 3:02 PM

## 2018-07-04 NOTE — Progress Notes (Signed)
Pt's last void at 2200. Not voiding at this time. Bladder scan showed 78ml. Notified MD, BMP and CBC stat ordered. Will monitor pt.

## 2018-07-04 NOTE — Progress Notes (Signed)
Offering IN and Out  Cath x 3 since last voided. Pt refused.

## 2018-07-04 NOTE — Progress Notes (Signed)
Pt refuses to have foley catheter placed

## 2018-07-04 NOTE — Progress Notes (Signed)
1 Day Post-Op   Subjective/Chief Complaint: Not passing urine well Refuses cath On oxygen  Pain well controlled  Not SOB   Objective: Vital signs in last 24 hours: Temp:  [97.5 F (36.4 C)-98.5 F (36.9 C)] 98.4 F (36.9 C) (02/13 0358) Pulse Rate:  [73-99] 99 (02/13 0539) Resp:  [12-20] 18 (02/13 0358) BP: (92-169)/(55-91) 116/77 (02/13 0358) SpO2:  [87 %-97 %] 92 % (02/13 0539) Weight:  [76.5 kg] 76.5 kg (02/12 1133)    Intake/Output from previous day: 02/12 0701 - 02/13 0700 In: 1960 [P.O.:360; I.V.:1350; IV Piggyback:250] Out: 5 [Blood:5] Intake/Output this shift: No intake/output data recorded.  Resp: clear to auscultation bilaterally Cardio: regular rate and rhythm, S1, S2 normal, no murmur, click, rub or gallop Incision/Wound:Port sites intact sore no hematoma   Lab Results:  Recent Labs    07/03/18 1920 07/04/18 0706  WBC 11.3* 19.8*  HGB 11.8* 12.1  HCT 37.2 38.4  PLT 270 327   BMET Recent Labs    07/01/18 0933 07/03/18 1920  NA 141  --   K 4.2  --   CL 105  --   CO2 26  --   GLUCOSE 106*  --   BUN 16  --   CREATININE 0.91 1.32*  CALCIUM 9.6  --    PT/INR No results for input(s): LABPROT, INR in the last 72 hours. ABG No results for input(s): PHART, HCO3 in the last 72 hours.  Invalid input(s): PCO2, PO2  Studies/Results: No results found.  Anti-infectives: Anti-infectives (From admission, onward)   Start     Dose/Rate Route Frequency Ordered Stop   07/03/18 1130  clindamycin (CLEOCIN) IVPB 900 mg     900 mg 100 mL/hr over 30 Minutes Intravenous On call to O.R. 07/03/18 1125 07/03/18 1310      Assessment/Plan: s/p Procedure(s): LAPAROSCOPIC INCISIONAL HERNIA REPAIR WITH MESH (N/A) Needs to ambulate  Give bolus and lasix  20 mg one time  Check labs  Hold other diuretic for now  IS Hopefully home Friday if theses issues resolve   LOS: 0 days    Elizabeth Mcclain 07/04/2018

## 2018-07-04 NOTE — Progress Notes (Signed)
Pt complains of dizziness while getting up to the bathroom. Pt desated to 8. Oxygen given at 4L given, O2Sat went up to 89. Notified MD on call. Ordered to keep pt on 4L at this time. Will monitor pt.

## 2018-07-05 ENCOUNTER — Inpatient Hospital Stay (HOSPITAL_COMMUNITY): Payer: Medicare Other

## 2018-07-05 DIAGNOSIS — A419 Sepsis, unspecified organism: Secondary | ICD-10-CM

## 2018-07-05 DIAGNOSIS — R6521 Severe sepsis with septic shock: Secondary | ICD-10-CM

## 2018-07-05 DIAGNOSIS — J9601 Acute respiratory failure with hypoxia: Secondary | ICD-10-CM

## 2018-07-05 DIAGNOSIS — K432 Incisional hernia without obstruction or gangrene: Principal | ICD-10-CM

## 2018-07-05 DIAGNOSIS — K567 Ileus, unspecified: Secondary | ICD-10-CM

## 2018-07-05 LAB — CBC
HCT: 35.9 % — ABNORMAL LOW (ref 36.0–46.0)
Hemoglobin: 11.7 g/dL — ABNORMAL LOW (ref 12.0–15.0)
MCH: 29.4 pg (ref 26.0–34.0)
MCHC: 32.6 g/dL (ref 30.0–36.0)
MCV: 90.2 fL (ref 80.0–100.0)
PLATELETS: 279 10*3/uL (ref 150–400)
RBC: 3.98 MIL/uL (ref 3.87–5.11)
RDW: 14.8 % (ref 11.5–15.5)
WBC: 7.7 10*3/uL (ref 4.0–10.5)
nRBC: 0 % (ref 0.0–0.2)

## 2018-07-05 LAB — POCT I-STAT 7, (LYTES, BLD GAS, ICA,H+H)
Acid-base deficit: 10 mmol/L — ABNORMAL HIGH (ref 0.0–2.0)
Acid-base deficit: 7 mmol/L — ABNORMAL HIGH (ref 0.0–2.0)
Bicarbonate: 20 mmol/L (ref 20.0–28.0)
Bicarbonate: 20 mmol/L (ref 20.0–28.0)
Calcium, Ion: 1.3 mmol/L (ref 1.15–1.40)
Calcium, Ion: 1.26 mmol/L (ref 1.15–1.40)
HCT: 36 % (ref 36.0–46.0)
HCT: 30 % — ABNORMAL LOW (ref 36.0–46.0)
HEMOGLOBIN: 10.2 g/dL — AB (ref 12.0–15.0)
Hemoglobin: 12.2 g/dL (ref 12.0–15.0)
O2 Saturation: 87 %
O2 Saturation: 94 %
PO2 ART: 86 mmHg (ref 83.0–108.0)
Potassium: 2.8 mmol/L — ABNORMAL LOW (ref 3.5–5.1)
Potassium: 2.8 mmol/L — ABNORMAL LOW (ref 3.5–5.1)
Sodium: 137 mmol/L (ref 135–145)
Sodium: 137 mmol/L (ref 135–145)
TCO2: 22 mmol/L (ref 22–32)
TCO2: 21 mmol/L — ABNORMAL LOW (ref 22–32)
pCO2 arterial: 64.1 mmHg — ABNORMAL HIGH (ref 32.0–48.0)
pCO2 arterial: 47.1 mmHg (ref 32.0–48.0)
pH, Arterial: 7.101 — CL (ref 7.350–7.450)
pH, Arterial: 7.236 — ABNORMAL LOW (ref 7.350–7.450)
pO2, Arterial: 72 mmHg — ABNORMAL LOW (ref 83.0–108.0)

## 2018-07-05 LAB — BASIC METABOLIC PANEL
Anion gap: 8 (ref 5–15)
BUN: 46 mg/dL — AB (ref 8–23)
CO2: 18 mmol/L — ABNORMAL LOW (ref 22–32)
CREATININE: 1.69 mg/dL — AB (ref 0.44–1.00)
Calcium: 8.5 mg/dL — ABNORMAL LOW (ref 8.9–10.3)
Chloride: 109 mmol/L (ref 98–111)
GFR calc Af Amer: 35 mL/min — ABNORMAL LOW (ref >60)
GFR calc non Af Amer: 30 mL/min — ABNORMAL LOW (ref >60)
Glucose, Bld: 140 mg/dL — ABNORMAL HIGH (ref 70–99)
Potassium: 2.8 mmol/L — ABNORMAL LOW (ref 3.5–5.1)
Sodium: 135 mmol/L (ref 135–145)

## 2018-07-05 LAB — COMPREHENSIVE METABOLIC PANEL
ALT: 56 U/L — ABNORMAL HIGH (ref 0–44)
AST: 71 U/L — ABNORMAL HIGH (ref 15–41)
Albumin: 4 g/dL (ref 3.5–5.0)
Alkaline Phosphatase: 59 U/L (ref 38–126)
Anion gap: 14 (ref 5–15)
BILIRUBIN TOTAL: 0.8 mg/dL (ref 0.3–1.2)
BUN: 47 mg/dL — ABNORMAL HIGH (ref 8–23)
CO2: 19 mmol/L — ABNORMAL LOW (ref 22–32)
Calcium: 9.1 mg/dL (ref 8.9–10.3)
Chloride: 101 mmol/L (ref 98–111)
Creatinine, Ser: 2.36 mg/dL — ABNORMAL HIGH (ref 0.44–1.00)
GFR calc Af Amer: 23 mL/min — ABNORMAL LOW (ref >60)
GFR, EST NON AFRICAN AMERICAN: 20 mL/min — AB (ref >60)
Glucose, Bld: 136 mg/dL — ABNORMAL HIGH (ref 70–99)
Potassium: 3 mmol/L — ABNORMAL LOW (ref 3.5–5.1)
Sodium: 134 mmol/L — ABNORMAL LOW (ref 135–145)
TOTAL PROTEIN: 6.9 g/dL (ref 6.5–8.1)

## 2018-07-05 LAB — LACTIC ACID, PLASMA
Lactic Acid, Venous: 1.6 mmol/L (ref 0.5–1.9)
Lactic Acid, Venous: 1.6 mmol/L (ref 0.5–1.9)

## 2018-07-05 LAB — GLUCOSE, CAPILLARY
Glucose-Capillary: 106 mg/dL — ABNORMAL HIGH (ref 70–99)
Glucose-Capillary: 110 mg/dL — ABNORMAL HIGH (ref 70–99)
Glucose-Capillary: 118 mg/dL — ABNORMAL HIGH (ref 70–99)
Glucose-Capillary: 119 mg/dL — ABNORMAL HIGH (ref 70–99)
Glucose-Capillary: 133 mg/dL — ABNORMAL HIGH (ref 70–99)

## 2018-07-05 LAB — POTASSIUM: Potassium: 3.4 mmol/L — ABNORMAL LOW (ref 3.5–5.1)

## 2018-07-05 LAB — MAGNESIUM: Magnesium: 1.5 mg/dL — ABNORMAL LOW (ref 1.7–2.4)

## 2018-07-05 MED ORDER — DEXMEDETOMIDINE HCL IN NACL 400 MCG/100ML IV SOLN
0.0000 ug/kg/h | INTRAVENOUS | Status: DC
  Administered 2018-07-05: 0.7 ug/kg/h via INTRAVENOUS
  Filled 2018-07-05 (×2): qty 100

## 2018-07-05 MED ORDER — LACTATED RINGERS IV BOLUS
1000.0000 mL | Freq: Once | INTRAVENOUS | Status: AC
  Administered 2018-07-05: 1000 mL via INTRAVENOUS

## 2018-07-05 MED ORDER — FENTANYL CITRATE (PF) 100 MCG/2ML IJ SOLN: 50.0000 ug | INTRAMUSCULAR | Status: DC | PRN

## 2018-07-05 MED ORDER — FENTANYL CITRATE (PF) 100 MCG/2ML IJ SOLN
50.0000 ug | INTRAMUSCULAR | Status: DC | PRN
  Administered 2018-07-05: 50 ug via INTRAVENOUS
  Filled 2018-07-05: qty 2

## 2018-07-05 MED ORDER — LEVOTHYROXINE SODIUM 100 MCG/5ML IV SOLN
12.5000 ug | Freq: Every day | INTRAVENOUS | Status: DC
  Administered 2018-07-06 – 2018-07-08 (×3): 12.5 ug via INTRAVENOUS
  Filled 2018-07-05 (×3): qty 5

## 2018-07-05 MED ORDER — LEVALBUTEROL HCL 0.63 MG/3ML IN NEBU
INHALATION_SOLUTION | RESPIRATORY_TRACT | Status: AC
  Filled 2018-07-05: qty 3

## 2018-07-05 MED ORDER — CHLORHEXIDINE GLUCONATE 0.12% ORAL RINSE (MEDLINE KIT)
15.0000 mL | Freq: Two times a day (BID) | OROMUCOSAL | Status: DC
  Administered 2018-07-05 – 2018-07-06 (×3): 15 mL via OROMUCOSAL

## 2018-07-05 MED ORDER — FENTANYL BOLUS VIA INFUSION
50.0000 ug | INTRAVENOUS | Status: DC | PRN
  Administered 2018-07-06 (×2): 50 ug via INTRAVENOUS
  Filled 2018-07-05: qty 50

## 2018-07-05 MED ORDER — SODIUM CHLORIDE 0.9 % IV SOLN
INTRAVENOUS | Status: DC | PRN
  Administered 2018-07-05 – 2018-07-06 (×3): 1000 mL via INTRAVENOUS
  Administered 2018-07-09: 20:00:00 via INTRAVENOUS

## 2018-07-05 MED ORDER — ROCURONIUM BROMIDE 50 MG/5ML IV SOLN
80.0000 mg | Freq: Once | INTRAVENOUS | Status: AC
  Administered 2018-07-05: 80 mg via INTRAVENOUS

## 2018-07-05 MED ORDER — PROCHLORPERAZINE EDISYLATE 10 MG/2ML IJ SOLN
10.0000 mg | Freq: Four times a day (QID) | INTRAMUSCULAR | Status: DC | PRN
  Administered 2018-07-05 (×2): 10 mg via INTRAVENOUS
  Filled 2018-07-05 (×3): qty 2

## 2018-07-05 MED ORDER — FENTANYL 2500MCG IN NS 250ML (10MCG/ML) PREMIX INFUSION
25.0000 ug/h | INTRAVENOUS | Status: DC
  Administered 2018-07-05: 50 ug/h via INTRAVENOUS
  Filled 2018-07-05: qty 250

## 2018-07-05 MED ORDER — METRONIDAZOLE IN NACL 5-0.79 MG/ML-% IV SOLN
500.0000 mg | Freq: Three times a day (TID) | INTRAVENOUS | Status: DC
  Administered 2018-07-06 – 2018-07-08 (×7): 500 mg via INTRAVENOUS
  Filled 2018-07-05 (×8): qty 100

## 2018-07-05 MED ORDER — POTASSIUM CHLORIDE 10 MEQ/100ML IV SOLN
10.0000 meq | INTRAVENOUS | Status: AC
  Administered 2018-07-05 (×3): 10 meq via INTRAVENOUS
  Filled 2018-07-05 (×3): qty 100

## 2018-07-05 MED ORDER — NOREPINEPHRINE 4 MG/250ML-% IV SOLN
0.0000 ug/min | INTRAVENOUS | Status: DC
  Administered 2018-07-05: 10 ug/min via INTRAVENOUS
  Administered 2018-07-06: 2 ug/min via INTRAVENOUS
  Filled 2018-07-05 (×2): qty 250

## 2018-07-05 MED ORDER — MIDAZOLAM HCL 2 MG/2ML IJ SOLN
INTRAMUSCULAR | Status: AC
  Filled 2018-07-05: qty 2

## 2018-07-05 MED ORDER — SODIUM CHLORIDE 0.9 % IV SOLN: 2.0000 g | INTRAVENOUS | Status: DC

## 2018-07-05 MED ORDER — POTASSIUM CHLORIDE 10 MEQ/100ML IV SOLN: 10.0000 meq | INTRAVENOUS | Status: AC

## 2018-07-05 MED ORDER — ETOMIDATE 2 MG/ML IV SOLN
20.0000 mg | Freq: Once | INTRAVENOUS | Status: AC
  Administered 2018-07-05: 20 mg via INTRAVENOUS

## 2018-07-05 MED ORDER — MIDAZOLAM HCL 2 MG/2ML IJ SOLN
1.0000 mg | Freq: Once | INTRAMUSCULAR | Status: AC
  Administered 2018-07-05: 1 mg via INTRAVENOUS

## 2018-07-05 MED ORDER — ORAL CARE MOUTH RINSE
15.0000 mL | OROMUCOSAL | Status: DC
  Administered 2018-07-05 – 2018-07-06 (×11): 15 mL via OROMUCOSAL

## 2018-07-05 MED ORDER — FENTANYL CITRATE (PF) 100 MCG/2ML IJ SOLN
INTRAMUSCULAR | Status: AC
  Filled 2018-07-05: qty 2

## 2018-07-05 MED ORDER — FAMOTIDINE IN NACL 20-0.9 MG/50ML-% IV SOLN
20.0000 mg | INTRAVENOUS | Status: DC
  Administered 2018-07-05 – 2018-07-09 (×5): 20 mg via INTRAVENOUS
  Filled 2018-07-05 (×5): qty 50

## 2018-07-05 MED ORDER — FENTANYL CITRATE (PF) 100 MCG/2ML IJ SOLN
50.0000 ug | Freq: Once | INTRAMUSCULAR | Status: AC
  Administered 2018-07-05: 50 ug via INTRAVENOUS

## 2018-07-05 MED ORDER — ENOXAPARIN SODIUM 30 MG/0.3ML ~~LOC~~ SOLN
30.0000 mg | Freq: Every day | SUBCUTANEOUS | Status: DC
  Administered 2018-07-05 – 2018-07-07 (×3): 30 mg via SUBCUTANEOUS
  Filled 2018-07-05 (×3): qty 0.3

## 2018-07-05 MED ORDER — SODIUM CHLORIDE 0.9 % IV SOLN: 1.0000 g | INTRAVENOUS | Status: DC

## 2018-07-05 MED ORDER — SODIUM CHLORIDE 0.9 % IV SOLN
1.0000 g | INTRAVENOUS | Status: DC
  Administered 2018-07-05 – 2018-07-10 (×6): 1 g via INTRAVENOUS
  Filled 2018-07-05 (×7): qty 10

## 2018-07-05 MED ORDER — LEVALBUTEROL HCL 0.63 MG/3ML IN NEBU: 0.6300 mg | INHALATION_SOLUTION | RESPIRATORY_TRACT | Status: DC

## 2018-07-05 NOTE — Significant Event (Addendum)
Rapid Response Event Note  Overview: Respiratory Distress  Initial Focused Assessment: Called by 6N RN about patient having low oxygen saturations and respiratory distress. Patient had vomited x 3, + ileus post operatively, has refused NGT. Upon arrival, patient is alert, in respiratory distress, RR in the mid 30s, oxygen saturations in the 86-88% NRB 15L, HR in the 110-120s, BP stable. Lung sounds: rhonchi throughout, R worse than L, very weak cough, not really able to cough and clear secretions.   Interventions: -- STAT CXR  -- RN had paged CCS MD on call.  -- Unable to NTS due to patient compliance  Plan of Care: -- Dr. Brantley Stage arrived at the bedside at 0700. Gave orders for NGT, which I placed in the LT NARE, 1L came out immediately. RNs to place foley, and patient to be transferred to ICU. PCCM was consulted.  -- PCCM NP came to bedside, patient will be transferred to 81M.  Event Summary:    at    Call Time 0645  Arrival Time 0648 End Time 0800  Elizabeth Mcclain R

## 2018-07-05 NOTE — Procedures (Signed)
Intubation Procedure Note DORNA MALLET [id number] [birth year]  Procedure: Intubation Indications: Airway protection and maintenance, respiratory insufficiency   Procedure Details Consent: Unable to obtain consent because of emergent medical necessity. Time Out: Verified patient identification, verified procedure, site/side was marked, verified correct patient position, special equipment/implants available, medications/allergies/relevent history reviewed, required imaging and test results available.  Performed  Maximum sterile technique was used including gloves, hand hygiene and mask.  MAC glidescope    Evaluation Hemodynamic Status: BP stable throughout; O2 sats: stable throughout Patient's Current Condition: stable Complications: No apparent complications Patient did tolerate procedure well. Chest X-ray ordered to verify placement.  CXR: pending.  Directly supervised by Dr. Truitt Leep, DO, PGY-2 07/05/2018

## 2018-07-05 NOTE — Plan of Care (Signed)
  Problem: Activity: Goal: Risk for activity intolerance will decrease Outcome: Progressing   Problem: Pain Managment: Goal: General experience of comfort will improve Outcome: Progressing   

## 2018-07-05 NOTE — Consult Note (Signed)
NAME:  Elizabeth Mcclain, MRN:  [id number], DOB:  1945-12-11, LOS: 1 ADMISSION DATE:  07/03/2018, CONSULTATION DATE:  07/05/2018 REFERRING MD:  Dr. Brantley Stage, CHIEF COMPLAINT:  Respiratory distress   Brief History   5 yoF admitted to [facility] underwent hernia repair 2/12 with surgery complicated by episode of hypotension with resultant AKI.  Further complicated by development of ileus.  Patient had been refusing NGT and foley 2/13.  Overnight, had several episodes of vomiting, followed by respiratory distress and hypoxia.  S/p NGT with ~1.5L out thus far.   PCCM consulted for further pulmonary recommendations.   History of present illness   73 year old female with history of HTN, hypothyroidism, and prior umbilical repair 08/84 who developed mild discomfort and increasing bulging at site.  Admitted and underwent laparoscopic hernia repair with mesh 2/12 that went well, but was complicated by two episodes of hypotension.  Noted to be on lasix, HCTZ, celebrex, and losartan prior to surgery.  Labs 2/13 noted for AKI, nephrology was consulted.    KUB 2/13 showed acute ileus.  Overnight, patient developed nausea with several episodes of vomiting followed by respiratory distress and hypoxia requiring NRB.  Prior to, patient had been refusing NGT and foley placement.  An NGT was placed with ~1.5 L in output since.  Patient continues to have hypoxia and gurgling with poor cough concerning for ongoing aspiration event.  Patient to be moved to ICU, PCCM consulted for further pulmonary recommendations.   Past Medical History  HTN, hypothyroidism, prior umbilical hernia repair w/mesh 05/2016  Significant Hospital Events   2/12 admitted/ OR  Consults:  2/13 Nephrology  Procedures:  2/14 ETT >>  Significant Diagnostic Tests:  2/13 KUB >> Significant distention of stomach, small, and large bowel loops. Findings are most consistent with ileus.  Micro Data:  2/14 trach aspirate >>  Antimicrobials:  2/12  clindamycin preop 2/14 ceftriaxone  Interim history/subjective:  Remains on NRB  Objective   Blood pressure 132/62, pulse (!) 115, temperature 97.9 F (36.6 C), temperature source Oral, resp. rate 16, height 4\' 11"  (1.499 m), weight 76.5 kg, SpO2 92 %.        Intake/Output Summary (Last 24 hours) at 07/05/2018 0750 Last data filed at 07/05/2018 0741 Gross per 24 hour  Intake 2237.26 ml  Output 2700 ml  Net -462.74 ml   Filed Weights   07/03/18 1133  Weight: 76.5 kg    Examination: General:  Ill appearing female sitting upright in bed in moderate distress HEENT: MM pink/dry, pupils 3/reactive, brown emesis noted in mouth, NGT with brown emesis output Neuro: Lethargic but answers short questions, f/c, MAE CV: ST, no murmur PULM:  Tachypneic, audible gurgling, refractory after suctioning, poor cough, rales/ rhonchi diffuse R>L GI: distended, laparoscopic incisions approximated, abd binder in place  Extremities: warm/dry, no edema  Skin: no rashes   Resolved Hospital Problem list    Assessment & Plan:  Acute hypoxic respiratory failure likely related to aspiration event and component of dilated stomach w/ ileus  - CXR this am without obvious infiltrate  P:  S/p NGT for stomach decompression tx to ICU and intubate  Full MV support CXR and ABG post intubation Send trach aspirate Start ceftriaxone for aspiration coverage Albuterol prn  Pepcid for SUP PAD protocol with precedex/ fentanyl prn with DWA   Ileus  S/p lap hernia repair w/ mesh 2/12 P:  Per [facility] NPO Continue NGT to lWS Goal K>4, Mag > 2  AKI - uNa <  10, likely prerenal  P:  Nephrology following Continue D5/ 0.45 NS at 75 ml/hr Avoid further hypotension Place foley strict I/O's, trend UOP/ renal panel  Hypokalemia P:  S/p 2 runs KCL 2/14 Goal > 4 Trend BMP Check mag  HTN P: Hold norvasc, continue holding HCTZ No further diuretics for now  Hypothyroidism P:  Change PO synthroid to  IV  Depression P:  Hold paxil while NPO  Mild Transaminitis P:  Trend    Best practice:  Diet: NPO Pain/Anxiety/Delirium protocol (if indicated): precedex / prn fentanyl VAP protocol (if indicated): yes DVT prophylaxis: SCDs, add heparin SQ if ok with surgery GI prophylaxis: pepcid Glucose control: CBG q4, add SSI if > 180 Mobility: BR Code Status: Full  Family Communication: Husband, Shanon Brow, updated at bedside Disposition: tx to ICU  Labs   CBC: Recent Labs  Lab 07/01/18 0933 07/03/18 1920 07/04/18 0706 07/04/18 1902 07/05/18 0723  WBC 8.8 11.3* 19.8*  --  7.7  NEUTROABS 5.1  --   --   --   --   HGB 13.5 11.8* 12.1 10.8* 11.7*  HCT 42.4 37.2 38.4 34.0* 35.9*  MCV 92.8 90.1 90.8  --  90.2  PLT 277 270 327  --  270    Basic Metabolic Panel: Recent Labs  Lab 07/01/18 0933 07/03/18 1920 07/04/18 0706 07/05/18 0233  NA 141  --  138 134*  K 4.2  --  4.0 3.0*  CL 105  --  101 101  CO2 26  --  21* 19*  GLUCOSE 106*  --  144* 136*  BUN 16  --  40* 47*  CREATININE 0.91 1.32* 2.70* 2.36*  CALCIUM 9.6  --  9.3 9.1   GFR: Estimated Creatinine Clearance: 19.5 mL/min (A) (by C-G formula based on SCr of 2.36 mg/dL (H)). Recent Labs  Lab 07/01/18 0933 07/03/18 1920 07/04/18 0706 07/05/18 0723  WBC 8.8 11.3* 19.8* 7.7    Liver Function Tests: Recent Labs  Lab 07/01/18 0933 07/05/18 0233  AST 23 71*  ALT 23 56*  ALKPHOS 76 59  BILITOT 0.7 0.8  PROT 7.3 6.9  ALBUMIN 4.1 4.0   No results for input(s): LIPASE, AMYLASE in the last 168 hours. No results for input(s): AMMONIA in the last 168 hours.  ABG    Component Value Date/Time   HCO3 25.0 (H) 12/18/2006 1125   TCO2 28 10/07/2007 1117     Coagulation Profile: No results for input(s): INR, PROTIME in the last 168 hours.  Cardiac Enzymes: No results for input(s): CKTOTAL, CKMB, CKMBINDEX, TROPONINI in the last 168 hours.  HbA1C: No results found for: HGBA1C  CBG: No results for input(s):  GLUCAP in the last 168 hours.  Review of Systems:   Unable given patient's respiratory distress.  Past Medical History  She,  has a past medical history of Anxiety, Arthritis, Blood dyscrasia, Cancer ([place]), Depression, GERD (gastroesophageal reflux disease), Hypertension, Hypothyroidism, Occasional tremors, Reflux, and Thyroid disease.   Surgical History    Past Surgical History:  Procedure Laterality Date  . ABDOMINAL HYSTERECTOMY     partial  . BLADDER REPAIR    . CHOLECYSTECTOMY    . INCISIONAL HERNIA REPAIR N/A 07/03/2018   Procedure: LAPAROSCOPIC INCISIONAL HERNIA REPAIR WITH MESH;  Surgeon: Erroll Luna, MD;  Location: [facility];  Service: General;  Laterality: N/A;  . INSERTION OF MESH N/A 05/30/2016   Procedure: INSERTION OF MESH;  Surgeon: Erroll Luna, MD;  Location: [facility];  Service:  General;  Laterality: N/A;  . KNEE SURGERY Bilateral   . RECTOCELE REPAIR    . SHOULDER ARTHROSCOPY Bilateral   . UMBILICAL HERNIA REPAIR N/A 05/30/2016   Procedure: UMBILICAL HERNIA REPAIR;  Surgeon: Erroll Luna, MD;  Location: [facility];  Service: General;  Laterality: N/A;     Social History   reports that she has never smoked. She has never used smokeless tobacco. She reports that she does not drink alcohol or use drugs.   Family History   Her family history is not on file.   Allergies Allergies  Allergen Reactions  . Oxycodone-Acetaminophen Nausea And Vomiting  . Penicillins     rash Did it involve swelling of the face/tongue/throat, SOB, or low BP? No Did it involve sudden or severe rash/hives, skin peeling, or any reaction on the inside of your mouth or nose? No Did you need to seek medical attention at a hospital or doctor's office? No When did it last happen?15+ years If all above answers are "NO", may proceed with cephalosporin use.      Home Medications  Prior to Admission medications   Medication Sig Start Date End Date  Taking? Authorizing Provider  ALPRAZolam Duanne Moron) 0.25 MG tablet Take 0.25 mg by mouth daily as needed for anxiety.  07/22/15  Yes [provider]  amLODipine (NORVASC) 5 MG tablet Take 5 mg by mouth daily.   Yes [provider]  hydrochlorothiazide (HYDRODIURIL) 25 MG tablet Take 25 mg by mouth daily.  07/21/15  Yes [provider]  levothyroxine (SYNTHROID, LEVOTHROID) 25 MCG tablet Take 25 mcg by mouth daily. 06/02/15  Yes [provider]  losartan (COZAAR) 100 MG tablet Take 100 mg by mouth daily. 07/06/15  Yes [provider]  Multiple Vitamin (MULTIVITAMIN WITH MINERALS) TABS tablet Take 1 tablet by mouth daily.   Yes [provider]  naproxen (NAPROSYN) 500 MG tablet Take 500 mg by mouth daily as needed for moderate pain.   Yes [provider]  omeprazole (PRILOSEC) 20 MG capsule Take 20 mg by mouth daily.   Yes [provider]  PARoxetine (PAXIL) 40 MG tablet Take 40 mg by mouth daily.  06/02/15  Yes [provider]  solifenacin (VESICARE) 5 MG tablet Take 5 mg by mouth at bedtime.   Yes [provider]  vitamin B-12 (CYANOCOBALAMIN) 1000 MCG tablet Take 1,000 mcg by mouth daily.   Yes [provider]  Vitamin D, Ergocalciferol, (DRISDOL) 50000 units CAPS capsule Take 50,000 Units by mouth every Sunday.  08/03/15  Yes [provider]  celecoxib (CELEBREX) 200 MG capsule Take 1 capsule (200 mg total) by mouth 2 (two) times daily. Patient not taking: Reported on 06/25/2018 05/30/16   Erroll Luna, MD  diphenhydrAMINE HCl (ZZZQUIL) 50 MG/30ML LIQD Take 50 mg by mouth at bedtime as needed (sleep).    [provider]  ibuprofen (ADVIL,MOTRIN) 800 MG tablet Take 1 tablet (800 mg total) by mouth every 8 (eight) hours as needed. 07/03/18   Cornett, [name], MD  ondansetron (ZOFRAN) 4 MG tablet Take 1 tablet (4 mg total) by mouth every 8 (eight) hours as needed for nausea or vomiting. 07/03/18    Cornett, [name], MD  oxyCODONE (OXY IR/ROXICODONE) 5 MG immediate release tablet Take 1 tablet (5 mg total) by mouth every 6 (six) hours as needed for severe pain. 07/03/18   Erroll Luna, MD     Critical care time: 36 mins    Kennieth Rad, MSN,  AGACNP-BC [facility] Pgr: [phone] or if no answer [phone] 07/05/2018, 8:46 AM

## 2018-07-05 NOTE — Progress Notes (Signed)
Pulled tube back 2 CM to 22CM at lip per md order

## 2018-07-05 NOTE — Progress Notes (Addendum)
Pt vomited again, Zofran not due until 3am. Notified on call Dr Grandville Silos and received new order for Compazine. Also Dr Grandville Silos mentioned if pt would allow to have NGT placed writer already spoke to pt beforehand regarding NGT but pt refused NGT.

## 2018-07-05 NOTE — Progress Notes (Signed)
CCM doc paged as pt becoming increasingly hypotensive. MD came to bedside to evaluate. Decided to switch to Fentanyl gtt from Precedex gtt and to continue monitoring BP. Will monitor pt closely.

## 2018-07-05 NOTE — Progress Notes (Signed)
Gave report to receiving unit 86M RN.

## 2018-07-05 NOTE — Progress Notes (Signed)
2 Days Post-Op   Subjective/Chief Complaint: Pt refused NGT and foley overnight  Films show ileus  In respiratory distress this am  On NRB family at bedside  UOP better yesterday   Emesis overnight   NGT placed by nurse currently   Agrees to foley now    Objective: Vital signs in last 24 hours: Temp:  [97.6 F (36.4 C)-98.7 F (37.1 C)] 97.9 F (36.6 C) (02/14 0642) Pulse Rate:  [86-115] 115 (02/14 0642) Resp:  [14-22] 16 (02/14 0232) BP: (107-137)/(55-91) 132/62 (02/14 0642) SpO2:  [90 %-99 %] 92 % (02/14 0642) Last BM Date: 07/02/18  Intake/Output from previous day: 02/13 0701 - 02/14 0700 In: 2237.3 [P.O.:1320; I.V.:917.3] Out: 875 [Urine:750; Emesis/NG output:125] Intake/Output this shift: No intake/output data recorded.  General appearance: fatigued Resp: SOB labored breathing poor air movement  Cardio: tachycardia  Incision/Wound: distended abdomen quiet no peritonitis port sites intact   Lab Results:  Recent Labs    07/03/18 1920 07/04/18 0706 07/04/18 1902  WBC 11.3* 19.8*  --   HGB 11.8* 12.1 10.8*  HCT 37.2 38.4 34.0*  PLT 270 327  --    BMET Recent Labs    07/04/18 0706 07/05/18 0233  NA 138 134*  K 4.0 3.0*  CL 101 101  CO2 21* 19*  GLUCOSE 144* 136*  BUN 40* 47*  CREATININE 2.70* 2.36*  CALCIUM 9.3 9.1   PT/INR No results for input(s): LABPROT, INR in the last 72 hours. ABG No results for input(s): PHART, HCO3 in the last 72 hours.  Invalid input(s): PCO2, PO2  Studies/Results: Dg Abd 1 View  Result Date: 07/04/2018 CLINICAL DATA:  Pt having abdominal pain and distention after having a hernia repair yesterday EXAM: ABDOMEN - 1 VIEW COMPARISON:  None. FINDINGS: There is significant gaseous distension of large and small bowel loops. Stomach is distended. No evidence for free intraperitoneal air. IMPRESSION: Significant distention of stomach, small, and large bowel loops. Findings are most consistent with ileus. Electronically  Signed   By: Nolon Nations M.D.   On: 07/04/2018 19:19   US Renal  Result Date: 07/04/2018 CLINICAL DATA:  Acute renal failure. EXAM: RENAL / URINARY TRACT ULTRASOUND COMPLETE COMPARISON:  CT of the abdomen and pelvis on 06/04/2018 FINDINGS: Right Kidney: Renal measurements: 9.4 x 4.1 x 4.4 centimeters = volume: 89.6 mL . Echogenicity within normal limits. No mass or hydronephrosis visualized. Left Kidney: Renal measurements: 9.6 x 4.8 x 3.5 centimeters = volume: 84.5 mL. Echogenicity within normal limits. No mass or hydronephrosis visualized. Focal hyperechoic areas within the renal parenchyma may represent prominent vascular interfaces. Recent CT exam demonstrated no definite intrarenal calculi. Bladder: Limited evaluation secondary to overlying bowel gas. Study quality is degraded by significant bowel gas. IMPRESSION: 1. No hydronephrosis. 2. No focal renal mass. Electronically Signed   By: Nolon Nations M.D.   On: 07/04/2018 19:27    Anti-infectives: Anti-infectives (From admission, onward)   Start     Dose/Rate Route Frequency Ordered Stop   07/03/18 1130  clindamycin (CLEOCIN) IVPB 900 mg     900 mg 100 mL/hr over 30 Minutes Intravenous On call to O.R. 07/03/18 1125 07/03/18 1310      Assessment/Plan: s/p Procedure(s): LAPAROSCOPIC INCISIONAL HERNIA REPAIR WITH MESH (N/A)   AKI -Cr better with UOP up - place foley for strict I/O -  Appreciate nephrology following   Ileus -  K low -  Replace-   Agrees to NGT after discussion  Refused overnight  Got  1 liter out with NGT   Respiratory distress  Either aspiration or secondary to abdominal distention    Consulted ICU team for assistance with management of respiratory issues   Continue NPO for now   LOS: 1 day    Joyice Faster Lucious Zou 07/05/2018

## 2018-07-05 NOTE — Progress Notes (Signed)
Pt c/o SOB, O2 sats at 87%. Pt placed on non rebreather O2 sats  went up to 90-92%. Rapid Response RN and RT at bedside. Called on call Dr Grandville Silos ordered portable chest xray. @0715  Dr Brantley Stage at bedside. Received new order for NG tube and foley. Also order for pt transfer to ICU. Rapid Response RN inserted NG tube Fr 16 to left nare with 1030ml  brown output. Husband at bedside and aware of pt plan as per discussion with MD.

## 2018-07-05 NOTE — Progress Notes (Signed)
[facility] Progress Note   Subjective:   Pt unfortunately intubated after ileus > emesis > hypoxia.  She is now in ICU on vent.  NG tube outpt 2.1L.     Objective Vitals:   07/05/18 1200 07/05/18 1210 07/05/18 1300 07/05/18 1305  BP: 90/61   (!) 87/71  Pulse: 96  96 94  Resp: (!) 25  20 (!) 24  Temp:  (!) 97.5 F (36.4 C)    TempSrc:  Oral    SpO2: 96% 96% 97% 97%  Weight:      Height:       Physical Exam General: intubated and sedated Heart: RRR Lungs: coarse ant, on FiO2 1005 Abdomen: soft, mod distended Extremities: no edema  Additional Objective Labs: Basic Metabolic Panel: Recent Labs  Lab 07/04/18 0706 07/05/18 0233 07/05/18 1008 07/05/18 1120 07/05/18 1219  NA 138 134* 137 135 137  K 4.0 3.0* 2.8* 2.8* 2.8*  CL 101 101  --  109  --   CO2 21* 19*  --  18*  --   GLUCOSE 144* 136*  --  140*  --   BUN 40* 47*  --  46*  --   CREATININE 2.70* 2.36*  --  1.69*  --   CALCIUM 9.3 9.1  --  8.5*  --    Liver Function Tests: Recent Labs  Lab 07/01/18 0933 07/05/18 0233  AST 23 71*  ALT 23 56*  ALKPHOS 76 59  BILITOT 0.7 0.8  PROT 7.3 6.9  ALBUMIN 4.1 4.0   No results for input(s): LIPASE, AMYLASE in the last 168 hours. CBC: Recent Labs  Lab 07/01/18 0933 07/03/18 1920 07/04/18 0706  07/05/18 0723 07/05/18 1008 07/05/18 1219  WBC 8.8 11.3* 19.8*  --  7.7  --   --   NEUTROABS 5.1  --   --   --   --   --   --   HGB 13.5 11.8* 12.1   < > 11.7* 12.2 10.2*  HCT 42.4 37.2 38.4   < > 35.9* 36.0 30.0*  MCV 92.8 90.1 90.8  --  90.2  --   --   PLT 277 270 327  --  279  --   --    < > = values in this interval not displayed.   Blood Culture No results found for: SDES, SPECREQUEST, CULT, REPTSTATUS  Cardiac Enzymes: No results for input(s): CKTOTAL, CKMB, CKMBINDEX, TROPONINI in the last 168 hours. CBG: Recent Labs  Lab 07/05/18 0824 07/05/18 1226  GLUCAP 133* 118*   Iron Studies: No results for input(s): IRON, TIBC, TRANSFERRIN,  FERRITIN in the last 72 hours. _0 @ Studies/Results: Dg Chest 1 View  Result Date: 07/05/2018 CLINICAL DATA:  Difficult intubation. EXAM: CHEST  1 VIEW COMPARISON:  Earlier the same date. FINDINGS: 1235 hours. The endotracheal tube has been retracted and appears located just above the carina, although the carina is not well visualized. Enteric tube projects into the stomach. There has been partial re-expansion of the left lung. There is bibasilar atelectasis and a possible small left pleural effusion. No pneumothorax or change in the cardiomediastinal contours. Postsurgical changes are present at both shoulders. IMPRESSION: 1. The endotracheal tube has been partially withdrawn and lies above the carina which is not well seen. 2. Partial re-expansion of the left lung. Electronically Signed   By: Richardean Sale M.D.   On: 07/05/2018 13:17   Dg Abd 1 View  Result Date: 07/04/2018 CLINICAL DATA:  Pt having  abdominal pain and distention after having a hernia repair yesterday EXAM: ABDOMEN - 1 VIEW COMPARISON:  None. FINDINGS: There is significant gaseous distension of large and small bowel loops. Stomach is distended. No evidence for free intraperitoneal air. IMPRESSION: Significant distention of stomach, small, and large bowel loops. Findings are most consistent with ileus. Electronically Signed   By: Nolon Nations M.D.   On: 07/04/2018 19:19   US Renal  Result Date: 07/04/2018 CLINICAL DATA:  Acute renal failure. EXAM: RENAL / URINARY TRACT ULTRASOUND COMPLETE COMPARISON:  CT of the abdomen and pelvis on 06/04/2018 FINDINGS: Right Kidney: Renal measurements: 9.4 x 4.1 x 4.4 centimeters = volume: 89.6 mL . Echogenicity within normal limits. No mass or hydronephrosis visualized. Left Kidney: Renal measurements: 9.6 x 4.8 x 3.5 centimeters = volume: 84.5 mL. Echogenicity within normal limits. No mass or hydronephrosis visualized. Focal hyperechoic areas within the renal parenchyma may represent  prominent vascular interfaces. Recent CT exam demonstrated no definite intrarenal calculi. Bladder: Limited evaluation secondary to overlying bowel gas. Study quality is degraded by significant bowel gas. IMPRESSION: 1. No hydronephrosis. 2. No focal renal mass. Electronically Signed   By: Nolon Nations M.D.   On: 07/04/2018 19:27   Portable Chest X-ray  Result Date: 07/05/2018 CLINICAL DATA:  Intubation. EXAM: PORTABLE CHEST 1 VIEW COMPARISON:  Earlier today FINDINGS: Complete collapse of the left lung. There was atelectasis previously seen at the left base but this change is primarily attributed to right mainstem intubation. Suggest tube retraction by approximately 2 cm. Orogastric tube reaches the stomach. Right perihilar atelectasis. The heart is obscured. These results will be called to the ordering clinician or representative by the Radiologist Assistant, and communication documented in the PACS or zVision Dashboard. IMPRESSION: 1. Right mainstem intubation with left lung collapse. 2. Right perihilar atelectasis. Electronically Signed   By: Monte Fantasia M.D.   On: 07/05/2018 10:17   Dg Chest Port 1 View  Result Date: 07/05/2018 CLINICAL DATA:  Hypoxia EXAM: PORTABLE CHEST 1 VIEW COMPARISON:  05/28/2017 FINDINGS: Nasogastric tube with tip over the mid stomach. Asymmetric elevation of the left diaphragm that is new from prior. Mild streaky density greater on the left. No Kerley lines, effusion, or pneumothorax. Probable cardiomegaly. IMPRESSION: 1. Nasogastric tube in unremarkable position. 2. Atelectasis with elevated left diaphragm. Electronically Signed   By: Monte Fantasia M.D.   On: 07/05/2018 07:39   Medications: . sodium chloride 10 mL/hr at 07/05/18 1300  . [START ON 07/06/2018] cefTRIAXone (ROCEPHIN)  IV    . dexmedetomidine (PRECEDEX) IV infusion 0.4 mcg/kg/hr (07/05/18 1300)  . dextrose 5 % and 0.9% NaCl Stopped (07/05/18 0654)  . famotidine (PEPCID) IV Stopped (07/05/18 0951)  .  potassium chloride     . acetaminophen  1,000 mg Oral Q6H  . chlorhexidine gluconate (MEDLINE KIT)  15 mL Mouth Rinse BID  . darifenacin  7.5 mg Oral Daily  . enoxaparin (LOVENOX) injection  30 mg Subcutaneous Daily  . gabapentin  300 mg Oral BID  . [START ON 07/06/2018] levothyroxine  12.5 mcg Intravenous Daily  . mouth rinse  15 mL Mouth Rinse 10 times per day  . multivitamin with minerals  1 tablet Oral Daily  . pneumococcal 23 valent vaccine  0.5 mL Intramuscular Tomorrow-1000  . vitamin B-12  1,000 mcg Oral Daily  . [START ON 07/07/2018] Vitamin D (Ergocalciferol)  50,000 Units Oral Q Sun    Dialysis Orders:  Assessment/Plan: 1.  AKI:  Suspected prerenal insult exacerbated by  NSAID, ARB.  Renal function improved with hydration, holding ARB overnight with cr from 2.7 to 2.3 now to 1.69.  UA bland, UNa < 10 consistent with this.  In light of today's events she may have another AKI, will follow.   2.  Hypokalemia:  Primary repleting.    3.  Mixed metabolic and respiratory acidosis:  Supplement bicarb PRN  4.  HTN:  antiHTN on hold currently in light of hypotension.   5.  Anemia:  Hb 10.2 currently, transfusion per primary.   6. Acute hypoxic respiratory failure:  Secondary to aspiration.  FiO2 100%.  No room for pulmonary edema.  Use diuretics PRN to maintain euvolemia.   Jannifer Hick MD 07/05/2018, 2:35 PM  [facility] Pager: [phone]

## 2018-07-05 NOTE — Progress Notes (Signed)
Pt vomited x1. PRN compazine given. Pt educated about the importance of the NG tube but pt adamantly refused. She stated she wants to wait for the doctor.

## 2018-07-05 NOTE — Progress Notes (Signed)
Paged Dr. Loanne Drilling as pt hypotensive. BP=78/53 (62) 1L LR bolus ordered. Will continue to closely monitor pt.

## 2018-07-05 NOTE — Progress Notes (Signed)
Potassium ordered per MD. If K <3.5 after re-check, MD requests to be paged.

## 2018-07-06 ENCOUNTER — Inpatient Hospital Stay (HOSPITAL_COMMUNITY): Payer: Medicare Other

## 2018-07-06 ENCOUNTER — Inpatient Hospital Stay: Payer: Self-pay

## 2018-07-06 LAB — BASIC METABOLIC PANEL
ANION GAP: 10 (ref 5–15)
BUN: 45 mg/dL — ABNORMAL HIGH (ref 8–23)
CALCIUM: 8.2 mg/dL — AB (ref 8.9–10.3)
CO2: 18 mmol/L — ABNORMAL LOW (ref 22–32)
Chloride: 110 mmol/L (ref 98–111)
Creatinine, Ser: 1.83 mg/dL — ABNORMAL HIGH (ref 0.44–1.00)
GFR calc Af Amer: 32 mL/min — ABNORMAL LOW (ref >60)
GFR calc non Af Amer: 27 mL/min — ABNORMAL LOW (ref >60)
Glucose, Bld: 115 mg/dL — ABNORMAL HIGH (ref 70–99)
Potassium: 3.3 mmol/L — ABNORMAL LOW (ref 3.5–5.1)
Sodium: 138 mmol/L (ref 135–145)

## 2018-07-06 LAB — HEPATIC FUNCTION PANEL
ALT: 70 U/L — ABNORMAL HIGH (ref 0–44)
AST: 73 U/L — ABNORMAL HIGH (ref 15–41)
Albumin: 2.9 g/dL — ABNORMAL LOW (ref 3.5–5.0)
Alkaline Phosphatase: 55 U/L (ref 38–126)
Bilirubin, Direct: 0.7 mg/dL — ABNORMAL HIGH (ref 0.0–0.2)
Indirect Bilirubin: 0.7 mg/dL (ref 0.3–0.9)
Total Bilirubin: 1.4 mg/dL — ABNORMAL HIGH (ref 0.3–1.2)
Total Protein: 5.7 g/dL — ABNORMAL LOW (ref 6.5–8.1)

## 2018-07-06 LAB — CBC
HCT: 30.6 % — ABNORMAL LOW (ref 36.0–46.0)
Hemoglobin: 10 g/dL — ABNORMAL LOW (ref 12.0–15.0)
MCH: 29 pg (ref 26.0–34.0)
MCHC: 32.7 g/dL (ref 30.0–36.0)
MCV: 88.7 fL (ref 80.0–100.0)
Platelets: 236 10*3/uL (ref 150–400)
RBC: 3.45 MIL/uL — ABNORMAL LOW (ref 3.87–5.11)
RDW: 15 % (ref 11.5–15.5)
WBC: 9.2 10*3/uL (ref 4.0–10.5)
nRBC: 0 % (ref 0.0–0.2)

## 2018-07-06 LAB — GLUCOSE, CAPILLARY
GLUCOSE-CAPILLARY: 115 mg/dL — AB (ref 70–99)
Glucose-Capillary: 100 mg/dL — ABNORMAL HIGH (ref 70–99)
Glucose-Capillary: 98 mg/dL (ref 70–99)
Glucose-Capillary: 106 mg/dL — ABNORMAL HIGH (ref 70–99)
Glucose-Capillary: 112 mg/dL — ABNORMAL HIGH (ref 70–99)

## 2018-07-06 MED ORDER — WHITE PETROLATUM EX OINT
TOPICAL_OINTMENT | CUTANEOUS | Status: DC | PRN
  Administered 2018-07-06: 0.2 via TOPICAL

## 2018-07-06 MED ORDER — FUROSEMIDE 10 MG/ML IJ SOLN
20.0000 mg | Freq: Once | INTRAMUSCULAR | Status: AC
  Administered 2018-07-06: 20 mg via INTRAVENOUS
  Filled 2018-07-06: qty 2

## 2018-07-06 MED ORDER — ORAL CARE MOUTH RINSE
15.0000 mL | Freq: Two times a day (BID) | OROMUCOSAL | Status: DC
  Administered 2018-07-06 – 2018-07-11 (×10): 15 mL via OROMUCOSAL

## 2018-07-06 MED ORDER — FUROSEMIDE 10 MG/ML IJ SOLN
40.0000 mg | Freq: Once | INTRAMUSCULAR | Status: AC
  Administered 2018-07-06: 40 mg via INTRAVENOUS
  Filled 2018-07-06: qty 4

## 2018-07-06 MED ORDER — WHITE PETROLATUM EX OINT
TOPICAL_OINTMENT | CUTANEOUS | Status: AC
  Administered 2018-07-06: 0.2 via TOPICAL
  Filled 2018-07-06: qty 28.35

## 2018-07-06 MED ORDER — POTASSIUM CHLORIDE 20 MEQ/15ML (10%) PO SOLN
40.0000 meq | ORAL | Status: AC
  Administered 2018-07-06 (×2): 40 meq
  Filled 2018-07-06 (×2): qty 30

## 2018-07-06 MED ORDER — HALOPERIDOL LACTATE 5 MG/ML IJ SOLN
2.0000 mg | Freq: Once | INTRAMUSCULAR | Status: AC
  Administered 2018-07-06: 2 mg via INTRAVENOUS
  Filled 2018-07-06: qty 1

## 2018-07-06 NOTE — Progress Notes (Signed)
Initial Nutrition Assessment  DOCUMENTATION CODES:   Obesity unspecified  INTERVENTION:  - Will monitor for extubation and plan concerning nutrition. - Will provide recommendations or interventions at follow-up.    NUTRITION DIAGNOSIS:   Inadequate oral intake related to inability to eat as evidenced by NPO status.  GOAL:   Provide needs based on ASPEN/SCCM guidelines  MONITOR:   Vent status, Weight trends, Labs, I & O's  REASON FOR ASSESSMENT:   Ventilator  ASSESSMENT:   73 year old female with history of HTN, hypothyroidism, and prior umbilical repair 05/6107 who developed mild discomfort and increasing bulging at site.  Admitted and underwent laparoscopic hernia repair with mesh 6/04 complicated by two episodes of hypotension. Labs 2/13 noted for AKI, nephrology was consulted. KUB 2/13 showed acute ileus. Overnight 2/13-2/14 patient developed nausea with several episodes of vomiting followed by respiratory distress and hypoxia requiring NRB. An NGT was placed with ~1.5 L in output since. Concern for aspiration and patient was transferred to ICU and subsequently intubated.  Patient intubated with NGT to LIS with 300 ml output which RN reports is all from this shift. Husband at bedside and is able to provide all information. He reports that PTA patient had a very good appetite with no recent changes in appetite, no chewing or swallowing issues.   Per chart review, current weight is 169 lb and weight on 1/9 was also 169 lb. Weight at [facility] on 02/22/18 was 170 lb.  RN reports goal is for extubation today. She also reports clamping of NGT has occurred throughout the morning.    Patient is currently intubated on ventilator support MV: 5.6 L/min Temp (24hrs), Avg:98.5 F (36.9 C), Min:97.5 F (36.4 C), Max:99.3 F (37.4 C) Propofol: none BP: 136/62 and MAP: 83  Medications reviewed; 20 mg IV pepcid, 40 mg IV lasix x1 dose 2/15, 12.4 mcg IV synthroid/day, daily multivitamin  with minerals, 10 mEq IV KCl x3 runs 2/14, 40 mEq KCl per NGT x2 doses 2/15, 1000 mcg cyanocobalamin per NGT/day, 50000 units drisdol every Sunday.  Labs reviewed; CBGs: 100 and 98 mg/dl today, K: 3.3 mmol/l, BUN: 45 mg/dl, creatinine: 1.83 mg/dl, Ca: 8.2 mg/dl, AST elevated, GFR: 27 ml/min. IVF; D5-NS @ 75 mL/hr (306 kcal).     NUTRITION - FOCUSED PHYSICAL EXAM:  Completed; no muscle and no fat wasting, mild edema to BLE.  Diet Order:   Diet Order            Diet NPO time specified  Diet effective now        Diet - low sodium heart healthy              EDUCATION NEEDS:   No education needs have been identified at this time  Skin:  Skin Assessment: Skin Integrity Issues: Skin Integrity Issues:: Incisions Incisions: abdominal (2/12)  Last BM:  2/14  Height:   Ht Readings from Last 1 Encounters:  07/03/18 4\' 11"  (1.499 m)    Weight:   Wt Readings from Last 1 Encounters:  07/03/18 76.5 kg    Ideal Body Weight:  42.91 kg  BMI:  Body mass index is 34.06 kg/m.  Estimated Nutritional Needs:   Kcal:  1071-1300 kcal  Protein:  >/= 86 grams  Fluid:  >/= 1.5 L/day     Jarome Matin, MS, RD, LDN, [facility] Inpatient Clinical Dietitian Pager # [phone] After hours/weekend pager # [phone]

## 2018-07-06 NOTE — Progress Notes (Signed)
3 Days Post-Op   Subjective/Chief Complaint: Intubated yesterday, NGT and foley place yesterday  Looks better today  On vent and awake  Nods to questions    Objective: Vital signs in last 24 hours: Temp:  [97.5 F (36.4 C)-99.3 F (37.4 C)] 99.3 F (37.4 C) (02/14 2348) Pulse Rate:  [57-124] 72 (02/15 0730) Resp:  [20-31] 25 (02/15 0730) BP: (47-207)/(32-86) 116/57 (02/15 0730) SpO2:  [78 %-100 %] 98 % (02/15 0730) FiO2 (%):  [40 %-100 %] 40 % (02/15 0600) Last BM Date: 07/02/18  Intake/Output from previous day: 02/14 0701 - 02/15 0700 In: 2436.9 [I.V.:870.4; IV Piggyback:1566.5] Out: 3950 [Urine:1625; Emesis/NG output:2325] Intake/Output this shift: No intake/output data recorded.  Resp: clear to auscultation bilaterally Cardio: regular rate and rhythm, S1, S2 normal, no murmur, click, rub or gallop Incision/Wound:soft ND no peritonitis   Port sites clean intact   Lab Results:  Recent Labs    07/05/18 0723  07/05/18 1219 07/06/18 0422  WBC 7.7  --   --  9.2  HGB 11.7*   < > 10.2* 10.0*  HCT 35.9*   < > 30.0* 30.6*  PLT 279  --   --  236   < > = values in this interval not displayed.   BMET Recent Labs    07/05/18 1120 07/05/18 1219 07/05/18 2159 07/06/18 0422  NA 135 137  --  138  K 2.8* 2.8* 3.4* 3.3*  CL 109  --   --  110  CO2 18*  --   --  18*  GLUCOSE 140*  --   --  115*  BUN 46*  --   --  45*  CREATININE 1.69*  --   --  1.83*  CALCIUM 8.5*  --   --  8.2*   PT/INR No results for input(s): LABPROT, INR in the last 72 hours. ABG Recent Labs    07/05/18 1008 07/05/18 1219  PHART 7.101* 7.236*  HCO3 20.0 20.0    Studies/Results: Dg Chest 1 View  Result Date: 07/05/2018 CLINICAL DATA:  Difficult intubation. EXAM: CHEST  1 VIEW COMPARISON:  Earlier the same date. FINDINGS: 1235 hours. The endotracheal tube has been retracted and appears located just above the carina, although the carina is not well visualized. Enteric tube projects into the  stomach. There has been partial re-expansion of the left lung. There is bibasilar atelectasis and a possible small left pleural effusion. No pneumothorax or change in the cardiomediastinal contours. Postsurgical changes are present at both shoulders. IMPRESSION: 1. The endotracheal tube has been partially withdrawn and lies above the carina which is not well seen. 2. Partial re-expansion of the left lung. Electronically Signed   By: Richardean Sale M.D.   On: 07/05/2018 13:17   Dg Abd 1 View  Result Date: 07/04/2018 CLINICAL DATA:  Pt having abdominal pain and distention after having a hernia repair yesterday EXAM: ABDOMEN - 1 VIEW COMPARISON:  None. FINDINGS: There is significant gaseous distension of large and small bowel loops. Stomach is distended. No evidence for free intraperitoneal air. IMPRESSION: Significant distention of stomach, small, and large bowel loops. Findings are most consistent with ileus. Electronically Signed   By: Nolon Nations M.D.   On: 07/04/2018 19:19   US Renal  Result Date: 07/04/2018 CLINICAL DATA:  Acute renal failure. EXAM: RENAL / URINARY TRACT ULTRASOUND COMPLETE COMPARISON:  CT of the abdomen and pelvis on 06/04/2018 FINDINGS: Right Kidney: Renal measurements: 9.4 x 4.1 x 4.4 centimeters = volume: 89.6  mL . Echogenicity within normal limits. No mass or hydronephrosis visualized. Left Kidney: Renal measurements: 9.6 x 4.8 x 3.5 centimeters = volume: 84.5 mL. Echogenicity within normal limits. No mass or hydronephrosis visualized. Focal hyperechoic areas within the renal parenchyma may represent prominent vascular interfaces. Recent CT exam demonstrated no definite intrarenal calculi. Bladder: Limited evaluation secondary to overlying bowel gas. Study quality is degraded by significant bowel gas. IMPRESSION: 1. No hydronephrosis. 2. No focal renal mass. Electronically Signed   By: Nolon Nations M.D.   On: 07/04/2018 19:27   Portable Chest X-ray  Result Date:  07/05/2018 CLINICAL DATA:  Intubation. EXAM: PORTABLE CHEST 1 VIEW COMPARISON:  Earlier today FINDINGS: Complete collapse of the left lung. There was atelectasis previously seen at the left base but this change is primarily attributed to right mainstem intubation. Suggest tube retraction by approximately 2 cm. Orogastric tube reaches the stomach. Right perihilar atelectasis. The heart is obscured. These results will be called to the ordering clinician or representative by the Radiologist Assistant, and communication documented in the PACS or zVision Dashboard. IMPRESSION: 1. Right mainstem intubation with left lung collapse. 2. Right perihilar atelectasis. Electronically Signed   By: Monte Fantasia M.D.   On: 07/05/2018 10:17   Dg Chest Port 1 View  Result Date: 07/05/2018 CLINICAL DATA:  Hypoxia EXAM: PORTABLE CHEST 1 VIEW COMPARISON:  05/28/2017 FINDINGS: Nasogastric tube with tip over the mid stomach. Asymmetric elevation of the left diaphragm that is new from prior. Mild streaky density greater on the left. No Kerley lines, effusion, or pneumothorax. Probable cardiomegaly. IMPRESSION: 1. Nasogastric tube in unremarkable position. 2. Atelectasis with elevated left diaphragm. Electronically Signed   By: Monte Fantasia M.D.   On: 07/05/2018 07:39    Anti-infectives: Anti-infectives (From admission, onward)   Start     Dose/Rate Route Frequency Ordered Stop   07/06/18 0815  cefTRIAXone (ROCEPHIN) 1 g in sodium chloride 0.9 % 100 mL IVPB  Status:  Discontinued     1 g 200 mL/hr over 30 Minutes Intravenous Every 24 hours 07/05/18 0815 07/05/18 1836   07/05/18 2000  cefTRIAXone (ROCEPHIN) 1 g in sodium chloride 0.9 % 100 mL IVPB     1 g 200 mL/hr over 30 Minutes Intravenous Every 24 hours 07/05/18 1941     07/05/18 2000  metroNIDAZOLE (FLAGYL) IVPB 500 mg     500 mg 100 mL/hr over 60 Minutes Intravenous Every 8 hours 07/05/18 1941     07/05/18 0815  cefTRIAXone (ROCEPHIN) 2 g in sodium chloride 0.9  % 100 mL IVPB  Status:  Discontinued     2 g 200 mL/hr over 30 Minutes Intravenous Every 24 hours 07/05/18 0815 07/05/18 0815   07/03/18 1130  clindamycin (CLEOCIN) IVPB 900 mg     900 mg 100 mL/hr over 30 Minutes Intravenous On call to O.R. 07/03/18 1125 07/03/18 1310      Assessment/Plan: s/p Procedure(s): LAPAROSCOPIC INCISIONAL HERNIA REPAIR WITH MESH (N/A)   AKI -   Cr improved   UOP improved continue strict I/O   Appreciate nephrology help   VRDF -  probably secondary to ileus   / AKI/  electrolytes and medication issues   Appreciate CCM asisstance  Hopefully extubate soon  CXR improved    Ileus - continue NGT   For now   FEN  NPO follow lytes    Can be OOB and ambulate once extubated   DVT prevention - lovanox   And SCD  LOS: 2 days    Joyice Faster Holle Sprick 07/06/2018

## 2018-07-06 NOTE — Consult Note (Addendum)
NAME:  Elizabeth Mcclain, MRN:  [id number], DOB:  April 04, 1946, LOS: 2 ADMISSION DATE:  07/03/2018, CONSULTATION DATE:  07/05/2018 REFERRING MD:  Dr. Brantley Stage, CHIEF COMPLAINT:  Respiratory distress   Brief History   73 year old female with history of HTN, hypothyroidism, and prior umbilical repair 01/9832 who developed mild discomfort and increasing bulging at site.  Admitted and underwent laparoscopic hernia repair with mesh 2/12 that went well, but was complicated by two episodes of hypotension.  Noted to be on lasix, HCTZ, celebrex, and losartan prior to surgery.  Labs 2/13 noted for AKI, nephrology was consulted.    KUB 2/13 showed acute ileus.  Overnight, patient developed nausea with several episodes of vomiting followed by respiratory distress and hypoxia requiring NRB.  Prior to, patient had been refusing NGT and foley placement.  An NGT was placed with ~1.5 L in output since.  Patient continues to have hypoxia and gurgling with poor cough concerning for ongoing aspiration event.  Patient to be moved to ICU, PCCM consulted for further pulmonary recommendations.   Past Medical History  HTN, hypothyroidism, prior umbilical hernia repair w/mesh 05/2016  Significant Hospital Events   2/12 admitted/ OR 2/14 - intubated in ICU  Consults:  2/13 Nephrology  Procedures:  2/14 ETT >>  Significant Diagnostic Tests:  2/13 KUB >> Significant distention of stomach, small, and large bowel loops. Findings are most consistent with ileus.  Micro Data:  2/14 trach aspirate >>  Antimicrobials:  2/12 clindamycin preop 2/14 ceftriaxone  Interim history/subjective:    07/06/2018 = renal following for AKI; concerned that with ICU transfer patient is at risk for another AKI and indeeded creat up at 1.8 from 1.6. Yesterday PM - precedex gtt change to fentanyl gtt due to hypotension and fluids given and started on levphed. Curently on fent gtt andlevophed gtt 10mc. Mild transaminitis this morning. Remains  on vent CXR this am with LLL consolidation.Family at bedside - reports 3rd spacing esp upper extremities    Objective   Blood pressure (!) 122/57, pulse 78, temperature 99.3 F (37.4 C), temperature source Axillary, resp. rate (!) 28, height 4\' 11"  (1.499 m), weight 76.5 kg, SpO2 100 %.    Vent Mode: PRVC FiO2 (%):  [40 %-100 %] 40 % Set Rate:  [20 bmp-28 bmp] 28 bmp Vt Set:  [350 mL] 350 mL PEEP:  [5 cmH20] 5 cmH20 Plateau Pressure:  [18 cmH20-26 cmH20] 22 cmH20   Intake/Output Summary (Last 24 hours) at 07/06/2018 0741 Last data filed at 07/06/2018 0600 Gross per 24 hour  Intake 2302.9 ml  Output 2125 ml  Net 177.9 ml   Filed Weights   07/03/18 1133  Weight: 76.5 kg  General Appearance:  Looks criticall ill OBESE - + Head:  Normocephalic, without obvious abnormality, atraumatic Eyes:  PERRL - yes, conjunctiva/corneas - clear     Ears:  Normal external ear canals, both ears Nose:  G tube - nno Throat:  ETT TUBE - yes , OG tube - yes Neck:  Supple,  No enlargement/tenderness/nodules Lungs: Clear to auscultation bilaterally, Ventilator   Synchrony - yes Heart:  S1 and S2 normal, no murmur, CVP - no.  Pressors - yes levophed 69mcg Abdomen:  Soft, no masses, no organomegaly. Distended + Genitalia / Rectal:  Not done Extremities:  Extremities- intct. 3rd spacing + in uppers Skin:  ntact in exposed areas . Sacral area - not examined Neurologic:  Sedation - fent gtt -> RASS - -2 . Moves all 4s -  yes per rn. CAM-ICU - unable to test . Orientation - nodded appropriately briefly      [place]  Lab 07/05/18 1008 07/05/18 1219  PHART 7.101* 7.236*  PCO2ART 64.1* 47.1  PO2ART 72.0* 86.0  HCO3 20.0 20.0  TCO2 22 21*  O2SAT 87.0 94.0    CBC Recent Labs  Lab 07/04/18 0706  07/05/18 0723 07/05/18 1008 07/05/18 1219 07/06/18 0422  HGB 12.1   < > 11.7* 12.2 10.2* 10.0*  HCT 38.4   < > 35.9* 36.0 30.0* 30.6*  WBC 19.8*  --  7.7  --   --  9.2  PLT  327  --  279  --   --  236   < > = values in this interval not displayed.    COAGULATION No results for input(s): INR in the last 168 hours.  CARDIAC  No results for input(s): TROPONINI in the last 168 hours. No results for input(s): PROBNP in the last 168 hours.   CHEMISTRY Recent Labs  Lab 07/01/18 0933 07/03/18 1920 07/04/18 0706 07/05/18 0233 07/05/18 1008 07/05/18 1120 07/05/18 1219 07/05/18 2159 07/06/18 0422  NA 141  --  138 134* 137 135 137  --  138  K 4.2  --  4.0 3.0* 2.8* 2.8* 2.8* 3.4* 3.3*  CL 105  --  101 101  --  109  --   --  110  CO2 26  --  21* 19*  --  18*  --   --  18*  GLUCOSE 106*  --  144* 136*  --  140*  --   --  115*  BUN 16  --  40* 47*  --  46*  --   --  45*  CREATININE 0.91 1.32* 2.70* 2.36*  --  1.69*  --   --  1.83*  CALCIUM 9.6  --  9.3 9.1  --  8.5*  --   --  8.2*  MG  --   --   --   --   --  1.5*  --   --   --    Estimated Creatinine Clearance: 25.1 mL/min (A) (by C-G formula based on SCr of 1.83 mg/dL (H)).   LIVER Recent Labs  Lab 07/01/18 0933 07/05/18 0233 07/06/18 0422  AST 23 71* 73*  ALT 23 56* 70*  ALKPHOS 76 59 55  BILITOT 0.7 0.8 1.4*  PROT 7.3 6.9 5.7*  ALBUMIN 4.1 4.0 2.9*     INFECTIOUS Recent Labs  Lab 07/05/18 2105 07/05/18 2159  LATICACIDVEN 1.6 1.6     ENDOCRINE CBG (last 3)  Recent Labs    07/05/18 2005 07/05/18 2345 07/06/18 0406  GLUCAP 106* 119* 100*         IMAGING x48h  - image(s) personally visualized  -   highlighted in bold Dg Chest 1 View  Result Date: 07/05/2018 CLINICAL DATA:  Difficult intubation. EXAM: CHEST  1 VIEW COMPARISON:  Earlier the same date. FINDINGS: 1235 hours. The endotracheal tube has been retracted and appears located just above the carina, although the carina is not well visualized. Enteric tube projects into the stomach. There has been partial re-expansion of the left lung. There is bibasilar atelectasis and a possible small left pleural effusion. No  pneumothorax or change in the cardiomediastinal contours. Postsurgical changes are present at both shoulders. IMPRESSION: 1. The endotracheal tube has been partially withdrawn and lies above the carina which is not well seen. 2.  Partial re-expansion of the left lung. Electronically Signed   By: Richardean Sale M.D.   On: 07/05/2018 13:17   Dg Abd 1 View  Result Date: 07/04/2018 CLINICAL DATA:  Pt having abdominal pain and distention after having a hernia repair yesterday EXAM: ABDOMEN - 1 VIEW COMPARISON:  None. FINDINGS: There is significant gaseous distension of large and small bowel loops. Stomach is distended. No evidence for free intraperitoneal air. IMPRESSION: Significant distention of stomach, small, and large bowel loops. Findings are most consistent with ileus. Electronically Signed   By: Nolon Nations M.D.   On: 07/04/2018 19:19   US Renal  Result Date: 07/04/2018 CLINICAL DATA:  Acute renal failure. EXAM: RENAL / URINARY TRACT ULTRASOUND COMPLETE COMPARISON:  CT of the abdomen and pelvis on 06/04/2018 FINDINGS: Right Kidney: Renal measurements: 9.4 x 4.1 x 4.4 centimeters = volume: 89.6 mL . Echogenicity within normal limits. No mass or hydronephrosis visualized. Left Kidney: Renal measurements: 9.6 x 4.8 x 3.5 centimeters = volume: 84.5 mL. Echogenicity within normal limits. No mass or hydronephrosis visualized. Focal hyperechoic areas within the renal parenchyma may represent prominent vascular interfaces. Recent CT exam demonstrated no definite intrarenal calculi. Bladder: Limited evaluation secondary to overlying bowel gas. Study quality is degraded by significant bowel gas. IMPRESSION: 1. No hydronephrosis. 2. No focal renal mass. Electronically Signed   By: Nolon Nations M.D.   On: 07/04/2018 19:27   Portable Chest X-ray  Result Date: 07/05/2018 CLINICAL DATA:  Intubation. EXAM: PORTABLE CHEST 1 VIEW COMPARISON:  Earlier today FINDINGS: Complete collapse of the left lung. There  was atelectasis previously seen at the left base but this change is primarily attributed to right mainstem intubation. Suggest tube retraction by approximately 2 cm. Orogastric tube reaches the stomach. Right perihilar atelectasis. The heart is obscured. These results will be called to the ordering clinician or representative by the Radiologist Assistant, and communication documented in the PACS or zVision Dashboard. IMPRESSION: 1. Right mainstem intubation with left lung collapse. 2. Right perihilar atelectasis. Electronically Signed   By: Monte Fantasia M.D.   On: 07/05/2018 10:17   Dg Chest Port 1 View  Result Date: 07/05/2018 CLINICAL DATA:  Hypoxia EXAM: PORTABLE CHEST 1 VIEW COMPARISON:  05/28/2017 FINDINGS: Nasogastric tube with tip over the mid stomach. Asymmetric elevation of the left diaphragm that is new from prior. Mild streaky density greater on the left. No Kerley lines, effusion, or pneumothorax. Probable cardiomegaly. IMPRESSION: 1. Nasogastric tube in unremarkable position. 2. Atelectasis with elevated left diaphragm. Electronically Signed   By: Monte Fantasia M.D.   On: 07/05/2018 07:39     Resolved Hospital Problem list    Assessment & Plan:  Acute hypoxic respiratory failure likely related to aspiration event and component of dilated stomach w/ ileus    07/06/2018 - > does yes meet criteria for SBT in setting of Acute Respiratory Failure - if we she does well off sedation gtt   P:  Turn sedation gtt off and attempt SBT Otherwise full vent support with PRVC VAP bundle   LL aspiration pneumonia - Rx ceftriaxone and flagyl  Ileus  S/p lap hernia repair w/ mesh 5/00  - complicaed course by AKI, ileus and asppiration  P:  Per CCS NPO Continue NGT to lWS Goal K>4, Mag > 2  AKI - 2nd hit  07/06/2018 - creat some worse. Associated 3rd spacing +. Noted to be on gabapentin and enablex (but not in home med list). Currently has foley  P:  Dc gabapentin (already on  fent gtt) DC enablex (has foley) ? Diurese given 3rd spacing; renal to opine  Circulatory Shock  2/15 - started 2/14 . Coming off now  P - levophed for MAP > 65   Hypothyroidism P:  synthroid to IV  Depression/Anxiety P:  Hold paxil and benzo while NPO  Mild Transaminitis P:  DC scheduled tylenol Monitor  Pain - unclear why she is on gabapentin on robaxin in ICU  - dc those meds    Best practice:  Diet: NPO Pain/Anxiety/Delirium protocol (if indicated):fent gtt. RASS goal 0 to -2 VAP protocol (if indicated): yes DVT prophylaxis: SCDs, lovenox GI prophylaxis: pepcid Glucose control: CBG q4, add SSI if > 180 Mobility: BR Code Status: Full  Family Communication: husband, sister, and daughter updated at bedside Disposition:ICU   [place]   The patient Elizabeth Mcclain is critically ill with multiple organ systems failure and requires high complexity decision making for assessment and support, frequent evaluation and titration of therapies, application of advanced monitoring technologies and extensive interpretation of multiple databases.   Critical Care Time devoted to patient care services described in this note is  30  Minutes. This time reflects time of care of this signee Dr Brand Males. This critical care time does not reflect procedure time, or teaching time or supervisory time of PA/NP/Med student/Med Resident etc but could involve care discussion time     Dr. Brand Males, M.D., [facility].C.P Pulmonary and Critical Care Medicine Staff Physician [place] [facility] Pager: [phone], If no answer or between  15:00h - 7:00h: call [phone]  07/06/2018 7:41 AM

## 2018-07-06 NOTE — Progress Notes (Signed)
[place] Progress Note Patient Name: Elizabeth Mcclain DOB: 1946/05/03 MRN: [id number]   Date of Service  07/06/2018  HPI/Events of Note  hypokalemia  eICU Interventions  Potassium replaced     Intervention Category Intermediate Interventions: Electrolyte abnormality - evaluation and management  Jaecob Lowden 07/06/2018, 6:19 AM

## 2018-07-06 NOTE — Progress Notes (Signed)
[facility] Progress Note   Subjective:   Intubated and moved to ICU yesterday after ileus > aspiration event.  Looking better this AM and plans for extubation.  I/Os yesterday neg 1.5L (outs 1.7L UOP, NG 2.3L).  Given furosemide 40 IV to diurese in prep for extubation.   Objective Vitals:   07/06/18 0830 07/06/18 0845 07/06/18 0900 07/06/18 0929  BP: (!) 98/52 (!) 109/55 (!) 115/50 (!) 105/50  Pulse: 79 80 83 (!) 105  Resp: (!) 28 (!) 28 (!) 28 (!) 24  Temp:      TempSrc:      SpO2: 98% 97% 98% 98%  Weight:      Height:       Physical Exam General: intubated, awake and calm Heart: RRR Lungs: coarse ant, on FiO2 40% SBT Abdomen: soft, mod distended, abd binder in place Extremities: no edema  Additional Objective Labs: Basic Metabolic Panel: Recent Labs  Lab 07/05/18 0233  07/05/18 1120 07/05/18 1219 07/05/18 2159 07/06/18 0422  NA 134*   < > 135 137  --  138  K 3.0*   < > 2.8* 2.8* 3.4* 3.3*  CL 101  --  109  --   --  110  CO2 19*  --  18*  --   --  18*  GLUCOSE 136*  --  140*  --   --  115*  BUN 47*  --  46*  --   --  45*  CREATININE 2.36*  --  1.69*  --   --  1.83*  CALCIUM 9.1  --  8.5*  --   --  8.2*   < > = values in this interval not displayed.   Liver Function Tests: Recent Labs  Lab 07/01/18 0933 07/05/18 0233 07/06/18 0422  AST 23 71* 73*  ALT 23 56* 70*  ALKPHOS 76 59 55  BILITOT 0.7 0.8 1.4*  PROT 7.3 6.9 5.7*  ALBUMIN 4.1 4.0 2.9*   No results for input(s): LIPASE, AMYLASE in the last 168 hours. CBC: Recent Labs  Lab 07/01/18 0933 07/03/18 1920 07/04/18 0706  07/05/18 0723 07/05/18 1008 07/05/18 1219 07/06/18 0422  WBC 8.8 11.3* 19.8*  --  7.7  --   --  9.2  NEUTROABS 5.1  --   --   --   --   --   --   --   HGB 13.5 11.8* 12.1   < > 11.7* 12.2 10.2* 10.0*  HCT 42.4 37.2 38.4   < > 35.9* 36.0 30.0* 30.6*  MCV 92.8 90.1 90.8  --  90.2  --   --  88.7  PLT 277 270 327  --  279  --   --  236   < > = values in this  interval not displayed.   Blood Culture    Component Value Date/Time   SDES BLOOD LEFT HAND 07/05/2018 2214   SPECREQUEST  07/05/2018 2214    BOTTLES DRAWN AEROBIC ONLY Blood Culture results may not be optimal due to an inadequate volume of blood received in culture bottles   CULT  07/05/2018 2214    NO GROWTH < 12 HOURS Performed at [facility] [address], [place], [place] [address]    REPTSTATUS PENDING 07/05/2018 2214    Cardiac Enzymes: No results for input(s): CKTOTAL, CKMB, CKMBINDEX, TROPONINI in the last 168 hours. CBG: Recent Labs  Lab 07/05/18 1537 07/05/18 2005 07/05/18 2345 07/06/18 0406 07/06/18 0822  GLUCAP 110* 106*  119* 100* 98   Iron Studies: No results for input(s): IRON, TIBC, TRANSFERRIN, FERRITIN in the last 72 hours. _0 @ Studies/Results: Dg Chest 1 View  Result Date: 07/05/2018 CLINICAL DATA:  Difficult intubation. EXAM: CHEST  1 VIEW COMPARISON:  Earlier the same date. FINDINGS: 1235 hours. The endotracheal tube has been retracted and appears located just above the carina, although the carina is not well visualized. Enteric tube projects into the stomach. There has been partial re-expansion of the left lung. There is bibasilar atelectasis and a possible small left pleural effusion. No pneumothorax or change in the cardiomediastinal contours. Postsurgical changes are present at both shoulders. IMPRESSION: 1. The endotracheal tube has been partially withdrawn and lies above the carina which is not well seen. 2. Partial re-expansion of the left lung. Electronically Signed   By: Richardean Sale M.D.   On: 07/05/2018 13:17   Dg Abd 1 View  Result Date: 07/04/2018 CLINICAL DATA:  Pt having abdominal pain and distention after having a hernia repair yesterday EXAM: ABDOMEN - 1 VIEW COMPARISON:  None. FINDINGS: There is significant gaseous distension of large and small bowel loops. Stomach is distended. No evidence for free intraperitoneal air.  IMPRESSION: Significant distention of stomach, small, and large bowel loops. Findings are most consistent with ileus. Electronically Signed   By: Nolon Nations M.D.   On: 07/04/2018 19:19   US Renal  Result Date: 07/04/2018 CLINICAL DATA:  Acute renal failure. EXAM: RENAL / URINARY TRACT ULTRASOUND COMPLETE COMPARISON:  CT of the abdomen and pelvis on 06/04/2018 FINDINGS: Right Kidney: Renal measurements: 9.4 x 4.1 x 4.4 centimeters = volume: 89.6 mL . Echogenicity within normal limits. No mass or hydronephrosis visualized. Left Kidney: Renal measurements: 9.6 x 4.8 x 3.5 centimeters = volume: 84.5 mL. Echogenicity within normal limits. No mass or hydronephrosis visualized. Focal hyperechoic areas within the renal parenchyma may represent prominent vascular interfaces. Recent CT exam demonstrated no definite intrarenal calculi. Bladder: Limited evaluation secondary to overlying bowel gas. Study quality is degraded by significant bowel gas. IMPRESSION: 1. No hydronephrosis. 2. No focal renal mass. Electronically Signed   By: Nolon Nations M.D.   On: 07/04/2018 19:27   Portable Chest Xray  Result Date: 07/06/2018 CLINICAL DATA:  Ventilator dependent respiratory failure. Follow-up LEFT pleural effusion and bibasilar atelectasis and/or pneumonia. EXAM: PORTABLE CHEST 1 VIEW COMPARISON:  07/05/2018 and earlier. FINDINGS: Endotracheal tube tip projects just above the carina and could be withdrawn approximately 3 cm. Nasogastric to courses below the diaphragm into the stomach with its tip not included. Decreasing LEFT pleural effusion and improving aeration in the LEFT LOWER LOBE, though a small effusion and moderate airspace consolidation persists. Stable mild atelectasis in the RIGHT LOWER LOBE. Cardiac silhouette mildly enlarged for AP portable technique, unchanged. Pulmonary venous hypertension without overt edema. IMPRESSION: 1. Endotracheal tube tip just above the carina. This could be withdrawn  approximately 3 cm for more appropriate positioning. 2. Remaining support apparatus satisfactory. 3. Improved LEFT pleural effusion and improving aeration in the LEFT LOWER LOBE since yesterday. A small effusion and moderate LEFT LOWER LOBE atelectasis and/or pneumonia persists. 4. Stable mild RIGHT basilar atelectasis. These results will be called to the ordering clinician or representative by the Radiologist Assistant, and communication documented in the PACS or zVision Dashboard. Electronically Signed   By: Evangeline Dakin M.D.   On: 07/06/2018 08:03   Portable Chest X-ray  Result Date: 07/05/2018 CLINICAL DATA:  Intubation. EXAM: PORTABLE CHEST 1 VIEW COMPARISON:  Earlier  today FINDINGS: Complete collapse of the left lung. There was atelectasis previously seen at the left base but this change is primarily attributed to right mainstem intubation. Suggest tube retraction by approximately 2 cm. Orogastric tube reaches the stomach. Right perihilar atelectasis. The heart is obscured. These results will be called to the ordering clinician or representative by the Radiologist Assistant, and communication documented in the PACS or zVision Dashboard. IMPRESSION: 1. Right mainstem intubation with left lung collapse. 2. Right perihilar atelectasis. Electronically Signed   By: Monte Fantasia M.D.   On: 07/05/2018 10:17   Dg Chest Port 1 View  Result Date: 07/05/2018 CLINICAL DATA:  Hypoxia EXAM: PORTABLE CHEST 1 VIEW COMPARISON:  05/28/2017 FINDINGS: Nasogastric tube with tip over the mid stomach. Asymmetric elevation of the left diaphragm that is new from prior. Mild streaky density greater on the left. No Kerley lines, effusion, or pneumothorax. Probable cardiomegaly. IMPRESSION: 1. Nasogastric tube in unremarkable position. 2. Atelectasis with elevated left diaphragm. Electronically Signed   By: Monte Fantasia M.D.   On: 07/05/2018 07:39   [place] Ekg Site Rite  Result Date: 07/06/2018 If Site Rite image not  attached, placement could not be confirmed due to current cardiac rhythm.  Medications: . sodium chloride 1,000 mL (07/06/18 1002)  . cefTRIAXone (ROCEPHIN)  IV Stopped (07/06/18 0029)  . dextrose 5 % and 0.9% NaCl Stopped (07/05/18 0654)  . famotidine (PEPCID) IV 20 mg (07/06/18 1003)  . fentaNYL infusion INTRAVENOUS 120 mcg/hr (07/06/18 0618)  . metronidazole Stopped (07/06/18 0806)  . norepinephrine (LEVOPHED) Adult infusion 1 mcg/min (07/06/18 0900)   . chlorhexidine gluconate (MEDLINE KIT)  15 mL Mouth Rinse BID  . enoxaparin (LOVENOX) injection  30 mg Subcutaneous Daily  . levothyroxine  12.5 mcg Intravenous Daily  . mouth rinse  15 mL Mouth Rinse 10 times per day  . multivitamin with minerals  1 tablet Oral Daily  . pneumococcal 23 valent vaccine  0.5 mL Intramuscular Tomorrow-1000  . vitamin B-12  1,000 mcg Oral Daily  . [START ON 07/07/2018] Vitamin D (Ergocalciferol)  50,000 Units Oral Q Sun    Dialysis Orders:  Assessment/Plan: 1.  AKI:  Suspected prerenal insult exacerbated by NSAID, ARB.  Renal function improved with hydration, holding ARB with cr from 2.7 to 2.3 to 1.69 but in setting of acute hypoxic event + hypotension yesterday a bit worse today with Cr ~1.8.   Cont to follow closely, maintaining euvolemia, avoiding further insults as possible.   2.  Hypokalemia:  Primary repleting.  Improving.  3.  Mixed metabolic and respiratory acidosis:  Gases improving yesterday, serum bicarb this AM 18.  CTM.   4.  HTN:  antiHTN on hold currently in light of hypotension.  BP currently 120/50s.  5.  Anemia:  Hb 10.0 currently, transfusion per primary.   6. Acute hypoxic respiratory failure:  Secondary to aspiration.  Improving, likely will be extubated today.   Jannifer Hick MD 07/06/2018, 10:43 AM  [facility] Pager: [phone]

## 2018-07-06 NOTE — Progress Notes (Signed)
Spoke with Dr. Jonnie Finner concerning PICC placement. He agreed that it is appropriate for her to get a PICC placed.

## 2018-07-06 NOTE — Procedures (Signed)
Extubation Procedure Note  Patient Details:   Name: Elizabeth Mcclain DOB: [birth year] MRN: [id number]   Airway Documentation:    Vent end date: (not recorded) Vent end time: (not recorded)   Evaluation  O2 sats: stable throughout Complications: No apparent complications Patient did tolerate procedure well. Bilateral Breath Sounds: Clear, Diminished   Pt extubated per MD order.  Placed on 4L [place] tolerating well.  Pt had +cuff leak.  Pt has strong cough & able to voice   Ciro Backer 07/06/2018, 11:16 AM

## 2018-07-07 ENCOUNTER — Inpatient Hospital Stay (HOSPITAL_COMMUNITY): Payer: Medicare Other

## 2018-07-07 DIAGNOSIS — J9601 Acute respiratory failure with hypoxia: Secondary | ICD-10-CM

## 2018-07-07 LAB — HEPATIC FUNCTION PANEL
ALT: 82 U/L — ABNORMAL HIGH (ref 0–44)
AST: 112 U/L — ABNORMAL HIGH (ref 15–41)
Albumin: 3 g/dL — ABNORMAL LOW (ref 3.5–5.0)
Alkaline Phosphatase: 68 U/L (ref 38–126)
Bilirubin, Direct: 0.2 mg/dL (ref 0.0–0.2)
Indirect Bilirubin: 0.5 mg/dL (ref 0.3–0.9)
Total Bilirubin: 0.7 mg/dL (ref 0.3–1.2)
Total Protein: 6.4 g/dL — ABNORMAL LOW (ref 6.5–8.1)

## 2018-07-07 LAB — CBC WITH DIFFERENTIAL/PLATELET
Abs Immature Granulocytes: 0.07 10*3/uL (ref 0.00–0.07)
Basophils Absolute: 0 10*3/uL (ref 0.0–0.1)
Basophils Relative: 0 %
Eosinophils Absolute: 0 10*3/uL (ref 0.0–0.5)
Eosinophils Relative: 0 %
HCT: 31.1 % — ABNORMAL LOW (ref 36.0–46.0)
Hemoglobin: 10.4 g/dL — ABNORMAL LOW (ref 12.0–15.0)
Immature Granulocytes: 1 %
LYMPHS PCT: 10 %
Lymphs Abs: 0.8 10*3/uL (ref 0.7–4.0)
MCH: 29.3 pg (ref 26.0–34.0)
MCHC: 33.4 g/dL (ref 30.0–36.0)
MCV: 87.6 fL (ref 80.0–100.0)
Monocytes Absolute: 0.8 10*3/uL (ref 0.1–1.0)
Monocytes Relative: 9 %
Neutro Abs: 6.7 10*3/uL (ref 1.7–7.7)
Neutrophils Relative %: 80 %
Platelets: 232 10*3/uL (ref 150–400)
RBC: 3.55 MIL/uL — AB (ref 3.87–5.11)
RDW: 15.2 % (ref 11.5–15.5)
WBC: 8.4 10*3/uL (ref 4.0–10.5)
nRBC: 0 % (ref 0.0–0.2)

## 2018-07-07 LAB — PHOSPHORUS: Phosphorus: 2.2 mg/dL — ABNORMAL LOW (ref 2.5–4.6)

## 2018-07-07 LAB — BASIC METABOLIC PANEL
Anion gap: 8 (ref 5–15)
Anion gap: 8 (ref 5–15)
BUN: 32 mg/dL — ABNORMAL HIGH (ref 8–23)
BUN: 27 mg/dL — ABNORMAL HIGH (ref 8–23)
CALCIUM: 9.4 mg/dL (ref 8.9–10.3)
CO2: 27 mmol/L (ref 22–32)
CO2: 24 mmol/L (ref 22–32)
Calcium: 9.2 mg/dL (ref 8.9–10.3)
Chloride: 109 mmol/L (ref 98–111)
Chloride: 116 mmol/L — ABNORMAL HIGH (ref 98–111)
Creatinine, Ser: 1.2 mg/dL — ABNORMAL HIGH (ref 0.44–1.00)
Creatinine, Ser: 0.85 mg/dL (ref 0.44–1.00)
GFR calc Af Amer: 53 mL/min — ABNORMAL LOW (ref >60)
GFR calc Af Amer: >60 mL/min (ref >60)
GFR calc non Af Amer: 45 mL/min — ABNORMAL LOW (ref >60)
GFR calc non Af Amer: >60 mL/min (ref >60)
Glucose, Bld: 106 mg/dL — ABNORMAL HIGH (ref 70–99)
Glucose, Bld: 135 mg/dL — ABNORMAL HIGH (ref 70–99)
Potassium: 2.6 mmol/L — CL (ref 3.5–5.1)
Potassium: 4 mmol/L (ref 3.5–5.1)
Sodium: 144 mmol/L (ref 135–145)
Sodium: 148 mmol/L — ABNORMAL HIGH (ref 135–145)

## 2018-07-07 LAB — GLUCOSE, CAPILLARY
Glucose-Capillary: 111 mg/dL — ABNORMAL HIGH (ref 70–99)
Glucose-Capillary: 132 mg/dL — ABNORMAL HIGH (ref 70–99)
Glucose-Capillary: 141 mg/dL — ABNORMAL HIGH (ref 70–99)
Glucose-Capillary: 96 mg/dL (ref 70–99)
Glucose-Capillary: 98 mg/dL (ref 70–99)

## 2018-07-07 LAB — PROTIME-INR
INR: 1.07
Prothrombin Time: 13.8 seconds (ref 11.4–15.2)

## 2018-07-07 LAB — MAGNESIUM: Magnesium: 1.7 mg/dL (ref 1.7–2.4)

## 2018-07-07 MED ORDER — POTASSIUM CHLORIDE 20 MEQ/15ML (10%) PO SOLN
40.0000 meq | ORAL | Status: AC
  Administered 2018-07-07 (×3): 40 meq
  Filled 2018-07-07 (×3): qty 30

## 2018-07-07 MED ORDER — KCL IN DEXTROSE-NACL 20-5-0.9 MEQ/L-%-% IV SOLN
INTRAVENOUS | Status: DC
  Administered 2018-07-07 – 2018-07-08 (×3): via INTRAVENOUS
  Filled 2018-07-07 (×3): qty 1000

## 2018-07-07 MED ORDER — ENOXAPARIN SODIUM 40 MG/0.4ML ~~LOC~~ SOLN
40.0000 mg | Freq: Every day | SUBCUTANEOUS | Status: DC
  Administered 2018-07-08 – 2018-07-11 (×4): 40 mg via SUBCUTANEOUS
  Filled 2018-07-07 (×4): qty 0.4

## 2018-07-07 NOTE — Progress Notes (Signed)
At bedside to place PICC.  Husband wants to speak with MD prior to PICC placement to verify need.  Current PIV working well, providing adequate access for current needs.  RN to place IV team consult once pt and spouse agreeable to PICC vs cancel PICC order.

## 2018-07-07 NOTE — Progress Notes (Signed)
Telephone report called to [facility] RN. Pt stable at time of transfer. Transferred via wheelchair and cardiac monitor. All belongings sent with husband Shanon Brow.

## 2018-07-07 NOTE — Progress Notes (Signed)
Spoke with Dr. Chase Caller concerning PICC placement. He agreed that the patient's two PIVs were sufficient for their needs and discontinued order for PICC.

## 2018-07-07 NOTE — Progress Notes (Signed)
[facility] ADULT ICU REPLACEMENT PROTOCOL FOR AM LAB REPLACEMENT ONLY  The patient does apply for the [facility] Adult ICU Electrolyte Replacment Protocol based on the criteria listed below:   1. Is GFR >/= 40 ml/min? Yes.    Patient's GFR today is 45 2. Is urine output >/= 0.5 ml/kg/hr for the last 6 hours? Yes.   Patient's UOP is 1.1 ml/kg/hr 3. Is BUN < 60 mg/dL? Yes.    Patient's BUN today is 32 4. Abnormal electrolyte(s): k 2.6 5. Ordered repletion with: protocol 6. If a panic level lab has been reported, has the CCM MD in charge been notified? No..   Physician:    Ronda Fairly A 07/07/2018 4:33 AM

## 2018-07-07 NOTE — Progress Notes (Signed)
NAME:  Elizabeth Mcclain, MRN:  [id number], DOB:  [birth year], LOS: 3 ADMISSION DATE:  07/03/2018, CONSULTATION DATE:  07/05/2018 REFERRING MD:  Dr. Brantley Stage, CHIEF COMPLAINT:  Respiratory distress   Brief History   73 year old female with history of HTN, hypothyroidism, and prior umbilical repair 11/9022 who developed mild discomfort and increasing bulging at site.  Admitted and underwent laparoscopic hernia repair with mesh 2/12 that went well, but was complicated by two episodes of hypotension.  Noted to be on lasix, HCTZ, celebrex, and losartan prior to surgery.  Labs 2/13 noted for AKI, nephrology was consulted. KUB 2/13 showed acute ileus.  Overnight, patient developed nausea with several episodes of vomiting followed by respiratory distress and hypoxia requiring NRB.  Prior to, patient had been refusing NGT and foley placement.  An NGT was placed with ~1.5 L in output since.  Patient continued to have hypoxia and gurgling with poor cough concerning for ongoing aspiration event.  Patient to be moved to ICU, PCCM consulted for further pulmonary recommendations. Intubated 2/14 > extubated 2/15.  Tx back to floor 2/16 per CCS.   Past Medical History  HTN, hypothyroidism, prior umbilical hernia repair w/mesh 05/2016  Significant Hospital Events   2/12 admitted/ OR 2/14 intubated in ICU, AKI / Nephrology consutled.  Brief vasopressors.  2/15 extubated   Consults:  2/13 Nephrology  Procedures:  2/14 ETT >> 2/15  Significant Diagnostic Tests:  2/13 KUB >> significant distention of stomach, small, and large bowel loops. Findings are most consistent with ileus.  Micro Data:  2/14 trach aspirate >> few klebsiella pneumonia >>   Antimicrobials:  Clindamycin 2/12 (preop) Ceftriaxone 2/14 >>  Interim history/subjective:  RN reports pt passing gas.  NGT remains in place.  IVF's added back by CCS.  Nephrology signed off.   Objective   Blood pressure (!) 147/73, pulse 84, temperature 98.4 F  (36.9 C), temperature source Oral, resp. rate 15, height 4\' 11"  (1.499 m), weight 88.6 kg, SpO2 95 %.        Intake/Output Summary (Last 24 hours) at 07/07/2018 1512 Last data filed at 07/07/2018 1000 Gross per 24 hour  Intake 564.13 ml  Output 2800 ml  Net -2235.87 ml   Filed Weights   07/03/18 1133 07/07/18 0600  Weight: 76.5 kg 88.6 kg   EXAM General: elderly female sitting up in chair, family at bedside  HEENT: MM pink/dry, ngt in place  Neuro: awake, pleasantly confused CV: s1s2 rrr, no m/r/g PULM: even/non-labored, lungs bilaterally clear  GI: abdominal binder in place   Extremities: warm/dry, no edema  Skin: no rashes or lesions  LABS    PULMONARY Recent Labs  Lab 07/05/18 1008 07/05/18 1219  PHART 7.101* 7.236*  PCO2ART 64.1* 47.1  PO2ART 72.0* 86.0  HCO3 20.0 20.0  TCO2 22 21*  O2SAT 87.0 94.0    CBC Recent Labs  Lab 07/05/18 0723  07/05/18 1219 07/06/18 0422 07/07/18 0246  HGB 11.7*   < > 10.2* 10.0* 10.4*  HCT 35.9*   < > 30.0* 30.6* 31.1*  WBC 7.7  --   --  9.2 8.4  PLT 279  --   --  236 232   < > = values in this interval not displayed.    COAGULATION Recent Labs  Lab 07/07/18 0246  INR 1.07    CARDIAC  No results for input(s): TROPONINI in the last 168 hours. No results for input(s): PROBNP in the last 168 hours.   CHEMISTRY Recent  Labs  Lab 07/04/18 0706 07/05/18 0233 07/05/18 1008 07/05/18 1120 07/05/18 1219  07/06/18 0422 07/07/18 0246  NA 138 134* 137 135 137  --  138 144  K 4.0 3.0* 2.8* 2.8* 2.8*   < > 3.3* 2.6*  CL 101 101  --  109  --   --  110 109  CO2 21* 19*  --  18*  --   --  18* 27  GLUCOSE 144* 136*  --  140*  --   --  115* 106*  BUN 40* 47*  --  46*  --   --  45* 32*  CREATININE 2.70* 2.36*  --  1.69*  --   --  1.83* 1.20*  CALCIUM 9.3 9.1  --  8.5*  --   --  8.2* 9.2  MG  --   --   --  1.5*  --   --   --  1.7  PHOS  --   --   --   --   --   --   --  2.2*   < > = values in this interval not displayed.     Estimated Creatinine Clearance: 41.7 mL/min (A) (by C-G formula based on SCr of 1.2 mg/dL (H)).   LIVER Recent Labs  Lab 07/01/18 0933 07/05/18 0233 07/06/18 0422 07/07/18 0246  AST 23 71* 73* 112*  ALT 23 56* 70* 82*  ALKPHOS 76 59 55 68  BILITOT 0.7 0.8 1.4* 0.7  PROT 7.3 6.9 5.7* 6.4*  ALBUMIN 4.1 4.0 2.9* 3.0*  INR  --   --   --  1.07     INFECTIOUS Recent Labs  Lab 07/05/18 2105 07/05/18 2159  LATICACIDVEN 1.6 1.6     ENDOCRINE CBG (last 3)  Recent Labs    07/07/18 0427 07/07/18 0910 07/07/18 1207  GLUCAP 96 111* 141*    IMAGING   Dg Abd 1 View  Result Date: 07/07/2018 CLINICAL DATA:  Hernia repair Wednesday. RIGHT lower quadrant pain today. EXAM: ABDOMEN - 1 VIEW COMPARISON:  None. FINDINGS: Enteric tube appears well positioned with tip at the level of the gastric pylorus or duodenal bulb. Bowel gas pattern appears non obstructive. No evidence of abnormal fluid collection. No evidence of free intraperitoneal air. IMPRESSION: 1. Enteric tube well positioned with tip at the level of the gastric pylorus or duodenal bulb. 2. Nonobstructive bowel gas pattern. Electronically Signed   By: Franki Cabot M.D.   On: 07/07/2018 15:02   Dg Chest Port 1 View  Result Date: 07/07/2018 CLINICAL DATA:  Status post extubation EXAM: PORTABLE CHEST 1 VIEW COMPARISON:  July 06, 2018 FINDINGS: Endotracheal tube no longer present. Nasogastric tube tip and side port are below the diaphragm. No pneumothorax. There is atelectatic change in the left base region. There is no frank edema or consolidation. Heart is upper normal in size with pulmonary vascularity normal. No adenopathy. There is aortic atherosclerosis. No evident bone lesions. IMPRESSION: Nasogastric tube tip and side port below the diaphragm. No pneumothorax. Left base atelectasis. Lungs elsewhere clear. Stable cardiac silhouette. Aortic Atherosclerosis (ICD10-I70.0). Electronically Signed   By: Lowella Grip III  M.D.   On: 07/07/2018 06:11   Portable Chest Xray  Result Date: 07/06/2018 CLINICAL DATA:  Ventilator dependent respiratory failure. Follow-up LEFT pleural effusion and bibasilar atelectasis and/or pneumonia. EXAM: PORTABLE CHEST 1 VIEW COMPARISON:  07/05/2018 and earlier. FINDINGS: Endotracheal tube tip projects just above the carina and could be withdrawn approximately 3 cm. Nasogastric  to courses below the diaphragm into the stomach with its tip not included. Decreasing LEFT pleural effusion and improving aeration in the LEFT LOWER LOBE, though a small effusion and moderate airspace consolidation persists. Stable mild atelectasis in the RIGHT LOWER LOBE. Cardiac silhouette mildly enlarged for AP portable technique, unchanged. Pulmonary venous hypertension without overt edema. IMPRESSION: 1. Endotracheal tube tip just above the carina. This could be withdrawn approximately 3 cm for more appropriate positioning. 2. Remaining support apparatus satisfactory. 3. Improved LEFT pleural effusion and improving aeration in the LEFT LOWER LOBE since yesterday. A small effusion and moderate LEFT LOWER LOBE atelectasis and/or pneumonia persists. 4. Stable mild RIGHT basilar atelectasis. These results will be called to the ordering clinician or representative by the Radiologist Assistant, and communication documented in the PACS or zVision Dashboard. Electronically Signed   By: Evangeline Dakin M.D.   On: 07/06/2018 08:03   [place] Ekg Site Rite  Result Date: 07/06/2018 If Site Rite image not attached, placement could not be confirmed due to current cardiac rhythm.    Resolved Hospital Problem list   Shock   Assessment & Plan:   Acute hypoxic respiratory failure in setting of Aspiration PNA and component of dilated stomach w/ ileus.  Sputum culture positive for few klebsiella pneumoniae P:  O2 as needed to support sats > 90% Pulmonary hygiene - IS, mobilize  Follow intermittent CXR  Narrow abx as able  Await  cultures   Ileus  Laparoscopic Hernia Repair w/ mesh 2/12 P: Post operative recommendations per CCS NGT to stay in place per surgery  Goal K >4, Mg >2  AKI  Hypokalemia  P: Hold further diuresis  IVF per CCS Trend BMP / urinary output Replace electrolytes as indicated, K runs + added to MIVF 2/16 Avoid nephrotoxic agents, ensure adequate renal perfusion  Hypothyroidism P:  Synthroid IV   Depression/Anxiety P:  Hold home paxil, PRN xanax until able to take PO's   Mild Transaminitis P:  Follow LFT's   Pain P: Pain control per CCS   Best practice:  Diet: NPO Pain/Anxiety/Delirium protocol (if indicated): n/a VAP protocol (if indicated): yes DVT prophylaxis: SCDs, lovenox GI prophylaxis: pepcid Glucose control: CBG q4, add SSI if > 180 Mobility: BR Code Status: Full  Family Communication: Patient updated on plan of care Disposition: floor, transfer to [facility] as of 2/17 am.  PCCM will sign off.      Noe Gens, NP-C [facility] Pgr: [phone] or if no answer [phone] 07/07/2018, 3:12 PM

## 2018-07-07 NOTE — Progress Notes (Signed)
4 Days Post-Op   Subjective/Chief Complaint: Extubated but tired  Last BM Friday   No flatus   Husband at bedside    Objective: Vital signs in last 24 hours: Temp:  [98.2 F (36.8 C)-98.7 F (37.1 C)] 98.2 F (36.8 C) (02/16 0429) Pulse Rate:  [79-105] 94 (02/16 0700) Resp:  [12-28] 16 (02/16 0700) BP: (98-154)/(49-86) 154/77 (02/16 0700) SpO2:  [96 %-99 %] 98 % (02/16 0700) FiO2 (%):  [40 %] 40 % (02/15 0929) Weight:  [88.6 kg] 88.6 kg (02/16 0600) Last BM Date: 07/06/18  Intake/Output from previous day: 02/15 0701 - 02/16 0700 In: 678.5 [I.V.:119.9; NG/GT:125; IV Piggyback:433.6] Out: 4425 [Urine:3525; Emesis/NG output:900] Intake/Output this shift: No intake/output data recorded.  General appearance: fatigued Resp: clear to auscultation bilaterally Cardio: regular rate and rhythm, S1, S2 normal, no murmur, click, rub or gallop Incision/Wound:CDI soft ND QUIET   Lab Results:  Recent Labs    07/06/18 0422 07/07/18 0246  WBC 9.2 8.4  HGB 10.0* 10.4*  HCT 30.6* 31.1*  PLT 236 232   BMET Recent Labs    07/06/18 0422 07/07/18 0246  NA 138 144  K 3.3* 2.6*  CL 110 109  CO2 18* 27  GLUCOSE 115* 106*  BUN 45* 32*  CREATININE 1.83* 1.20*  CALCIUM 8.2* 9.2   PT/INR Recent Labs    07/07/18 0246  LABPROT 13.8  INR 1.07   ABG Recent Labs    07/05/18 1008 07/05/18 1219  PHART 7.101* 7.236*  HCO3 20.0 20.0    Studies/Results: Dg Chest 1 View  Result Date: 07/05/2018 CLINICAL DATA:  Difficult intubation. EXAM: CHEST  1 VIEW COMPARISON:  Earlier the same date. FINDINGS: 1235 hours. The endotracheal tube has been retracted and appears located just above the carina, although the carina is not well visualized. Enteric tube projects into the stomach. There has been partial re-expansion of the left lung. There is bibasilar atelectasis and a possible small left pleural effusion. No pneumothorax or change in the cardiomediastinal contours. Postsurgical  changes are present at both shoulders. IMPRESSION: 1. The endotracheal tube has been partially withdrawn and lies above the carina which is not well seen. 2. Partial re-expansion of the left lung. Electronically Signed   By: Richardean Sale M.D.   On: 07/05/2018 13:17   Dg Chest Port 1 View  Result Date: 07/07/2018 CLINICAL DATA:  Status post extubation EXAM: PORTABLE CHEST 1 VIEW COMPARISON:  July 06, 2018 FINDINGS: Endotracheal tube no longer present. Nasogastric tube tip and side port are below the diaphragm. No pneumothorax. There is atelectatic change in the left base region. There is no frank edema or consolidation. Heart is upper normal in size with pulmonary vascularity normal. No adenopathy. There is aortic atherosclerosis. No evident bone lesions. IMPRESSION: Nasogastric tube tip and side port below the diaphragm. No pneumothorax. Left base atelectasis. Lungs elsewhere clear. Stable cardiac silhouette. Aortic Atherosclerosis (ICD10-I70.0). Electronically Signed   By: Lowella Grip III M.D.   On: 07/07/2018 06:11   Portable Chest Xray  Result Date: 07/06/2018 CLINICAL DATA:  Ventilator dependent respiratory failure. Follow-up LEFT pleural effusion and bibasilar atelectasis and/or pneumonia. EXAM: PORTABLE CHEST 1 VIEW COMPARISON:  07/05/2018 and earlier. FINDINGS: Endotracheal tube tip projects just above the carina and could be withdrawn approximately 3 cm. Nasogastric to courses below the diaphragm into the stomach with its tip not included. Decreasing LEFT pleural effusion and improving aeration in the LEFT LOWER LOBE, though a small effusion and moderate airspace consolidation persists.  Stable mild atelectasis in the RIGHT LOWER LOBE. Cardiac silhouette mildly enlarged for AP portable technique, unchanged. Pulmonary venous hypertension without overt edema. IMPRESSION: 1. Endotracheal tube tip just above the carina. This could be withdrawn approximately 3 cm for more appropriate  positioning. 2. Remaining support apparatus satisfactory. 3. Improved LEFT pleural effusion and improving aeration in the LEFT LOWER LOBE since yesterday. A small effusion and moderate LEFT LOWER LOBE atelectasis and/or pneumonia persists. 4. Stable mild RIGHT basilar atelectasis. These results will be called to the ordering clinician or representative by the Radiologist Assistant, and communication documented in the PACS or zVision Dashboard. Electronically Signed   By: Evangeline Dakin M.D.   On: 07/06/2018 08:03   Portable Chest X-ray  Result Date: 07/05/2018 CLINICAL DATA:  Intubation. EXAM: PORTABLE CHEST 1 VIEW COMPARISON:  Earlier today FINDINGS: Complete collapse of the left lung. There was atelectasis previously seen at the left base but this change is primarily attributed to right mainstem intubation. Suggest tube retraction by approximately 2 cm. Orogastric tube reaches the stomach. Right perihilar atelectasis. The heart is obscured. These results will be called to the ordering clinician or representative by the Radiologist Assistant, and communication documented in the PACS or zVision Dashboard. IMPRESSION: 1. Right mainstem intubation with left lung collapse. 2. Right perihilar atelectasis. Electronically Signed   By: Monte Fantasia M.D.   On: 07/05/2018 10:17   [place] Ekg Site Rite  Result Date: 07/06/2018 If Site Rite image not attached, placement could not be confirmed due to current cardiac rhythm.   Anti-infectives: Anti-infectives (From admission, onward)   Start     Dose/Rate Route Frequency Ordered Stop   07/06/18 0815  cefTRIAXone (ROCEPHIN) 1 g in sodium chloride 0.9 % 100 mL IVPB  Status:  Discontinued     1 g 200 mL/hr over 30 Minutes Intravenous Every 24 hours 07/05/18 0815 07/05/18 1836   07/05/18 2000  cefTRIAXone (ROCEPHIN) 1 g in sodium chloride 0.9 % 100 mL IVPB     1 g 200 mL/hr over 30 Minutes Intravenous Every 24 hours 07/05/18 1941     07/05/18 2000  metroNIDAZOLE  (FLAGYL) IVPB 500 mg     500 mg 100 mL/hr over 60 Minutes Intravenous Every 8 hours 07/05/18 1941     07/05/18 0815  cefTRIAXone (ROCEPHIN) 2 g in sodium chloride 0.9 % 100 mL IVPB  Status:  Discontinued     2 g 200 mL/hr over 30 Minutes Intravenous Every 24 hours 07/05/18 0815 07/05/18 0815   07/03/18 1130  clindamycin (CLEOCIN) IVPB 900 mg     900 mg 100 mL/hr over 30 Minutes Intravenous On call to O.R. 07/03/18 1125 07/03/18 1310      Assessment/Plan: s/p Procedure(s): LAPAROSCOPIC INCISIONAL HERNIA REPAIR WITH MESH (N/A)   VRDF- extubated  Negative fluid balance  Pulmonary toilet   AKI- Cr better and good UOP   Restart IVF and add K  FEN  Replace K keep NPO for now   Check KUB today  OOB  Transfer to floor - ask PT / OT to see starting Monday   Follow electrolytes    Ileus - replace K  Strict I/O and keep NGT    LOS: 3 days    Elizabeth Mcclain A Charron Coultas 07/07/2018

## 2018-07-07 NOTE — Plan of Care (Signed)
  Problem: Pain Managment: Goal: General experience of comfort will improve Outcome: Progressing   

## 2018-07-08 DIAGNOSIS — R0603 Acute respiratory distress: Secondary | ICD-10-CM

## 2018-07-08 LAB — BASIC METABOLIC PANEL
Anion gap: 10 (ref 5–15)
BUN: 22 mg/dL (ref 8–23)
CO2: 24 mmol/L (ref 22–32)
Calcium: 9.2 mg/dL (ref 8.9–10.3)
Chloride: 115 mmol/L — ABNORMAL HIGH (ref 98–111)
Creatinine, Ser: 0.83 mg/dL (ref 0.44–1.00)
GFR calc Af Amer: >60 mL/min (ref >60)
GFR calc non Af Amer: >60 mL/min (ref >60)
Glucose, Bld: 145 mg/dL — ABNORMAL HIGH (ref 70–99)
Potassium: 3.9 mmol/L (ref 3.5–5.1)
Sodium: 149 mmol/L — ABNORMAL HIGH (ref 135–145)

## 2018-07-08 LAB — GLUCOSE, CAPILLARY
Glucose-Capillary: 102 mg/dL — ABNORMAL HIGH (ref 70–99)
Glucose-Capillary: 122 mg/dL — ABNORMAL HIGH (ref 70–99)
Glucose-Capillary: 127 mg/dL — ABNORMAL HIGH (ref 70–99)
Glucose-Capillary: 127 mg/dL — ABNORMAL HIGH (ref 70–99)
Glucose-Capillary: 138 mg/dL — ABNORMAL HIGH (ref 70–99)
Glucose-Capillary: 144 mg/dL — ABNORMAL HIGH (ref 70–99)
Glucose-Capillary: 99 mg/dL (ref 70–99)

## 2018-07-08 LAB — CULTURE, RESPIRATORY W GRAM STAIN

## 2018-07-08 LAB — CBC
HCT: 32.2 % — ABNORMAL LOW (ref 36.0–46.0)
Hemoglobin: 10.1 g/dL — ABNORMAL LOW (ref 12.0–15.0)
MCH: 28.2 pg (ref 26.0–34.0)
MCHC: 31.4 g/dL (ref 30.0–36.0)
MCV: 89.9 fL (ref 80.0–100.0)
NRBC: 0 % (ref 0.0–0.2)
Platelets: 264 10*3/uL (ref 150–400)
RBC: 3.58 MIL/uL — ABNORMAL LOW (ref 3.87–5.11)
RDW: 15.6 % — AB (ref 11.5–15.5)
WBC: 9 10*3/uL (ref 4.0–10.5)

## 2018-07-08 LAB — PHOSPHORUS: Phosphorus: <1 mg/dL — CL (ref 2.5–4.6)

## 2018-07-08 LAB — MAGNESIUM: Magnesium: 1.8 mg/dL (ref 1.7–2.4)

## 2018-07-08 LAB — CULTURE, RESPIRATORY

## 2018-07-08 MED ORDER — SODIUM CHLORIDE 0.9% FLUSH
10.0000 mL | INTRAVENOUS | Status: DC | PRN
  Administered 2018-07-09: 10 mL
  Filled 2018-07-08: qty 40

## 2018-07-08 MED ORDER — POTASSIUM PHOSPHATES 15 MMOLE/5ML IV SOLN: 20.0000 meq | Freq: Once | INTRAVENOUS | Status: DC

## 2018-07-08 MED ORDER — METRONIDAZOLE 500 MG PO TABS
500.0000 mg | ORAL_TABLET | Freq: Three times a day (TID) | ORAL | Status: DC
  Administered 2018-07-08 – 2018-07-11 (×9): 500 mg via ORAL
  Filled 2018-07-08 (×9): qty 1

## 2018-07-08 MED ORDER — LEVOTHYROXINE SODIUM 25 MCG PO TABS
25.0000 ug | ORAL_TABLET | Freq: Every day | ORAL | Status: DC
  Administered 2018-07-09 – 2018-07-11 (×3): 25 ug via ORAL
  Filled 2018-07-08 (×3): qty 1

## 2018-07-08 MED ORDER — PHENOL 1.4 % MT LIQD
1.0000 | OROMUCOSAL | Status: DC | PRN
  Filled 2018-07-08: qty 177

## 2018-07-08 MED ORDER — PAROXETINE HCL 20 MG PO TABS
40.0000 mg | ORAL_TABLET | Freq: Every day | ORAL | Status: DC
  Administered 2018-07-08 – 2018-07-11 (×4): 40 mg via ORAL
  Filled 2018-07-08 (×4): qty 2

## 2018-07-08 MED ORDER — SODIUM PHOSPHATES 45 MMOLE/15ML IV SOLN
30.0000 mmol | Freq: Once | INTRAVENOUS | Status: AC
  Administered 2018-07-08: 30 mmol via INTRAVENOUS
  Filled 2018-07-08: qty 10

## 2018-07-08 MED ORDER — DEXTROSE 5 % IV SOLN
INTRAVENOUS | Status: DC
  Administered 2018-07-08: 11:00:00 via INTRAVENOUS

## 2018-07-08 MED ORDER — ALPRAZOLAM 0.25 MG PO TABS: 0.2500 mg | ORAL_TABLET | Freq: Every day | ORAL | Status: DC | PRN

## 2018-07-08 MED ORDER — AMLODIPINE BESYLATE 5 MG PO TABS
5.0000 mg | ORAL_TABLET | Freq: Every day | ORAL | Status: DC
  Administered 2018-07-08: 5 mg via ORAL
  Filled 2018-07-08: qty 1

## 2018-07-08 NOTE — Progress Notes (Signed)
PROGRESS NOTE    Elizabeth Mcclain  PJA:[id number] DOB: [birth year] DOA: 07/03/2018 PCP: [facility]    Brief Narrative: 73 year old with past medical history significant for hypertension, hypothyroidism, prior umbilical hernia repair in January/2018 who developed mid discomfort and increased bulging at the site.  Admitted and underwent laparoscopic hernia repair with mesh and 2/12 that went well, but was complicated by 2 episode of hypotension.  Patient was noted to be on Lasix and hydrochlorothiazide, Celebrex and losartan prior to surgery.  Labs on 2/13 noted for AKI, nephrology was consulted.  KUB 2/13 show acute ileus.  Patient overnight developed nausea and severe episode of vomiting followed by respiratory distress and hypoxemia requiring NRB.  An NG tube was placed and return 1.5 L output.  Patient continued to have hypoxemia and gurgling with poor cough concerning for ongoing aspiration event.  Patient to move to ICU under CCM care, intubated on 12/14 and subsequently extubated on 2/15.   Assessment & Plan:   Active Problems:   Incisional hernia without obstruction or gangrene   Acute respiratory failure with hypoxia (HCC)  Acute respiratory failure; secondary to PNA.  Aspiration vs HCAP.  Patient intubated on 214 and subsequently extubated on 2/15. She is now on room air. Continue with ceftriaxone and Flagyl. Swallow evaluation. Trach aspirate few klebsiella.   Ileus; Laparoscopic Hernia Repair with Mesh 2-12.  NG tube removed.  Patient is started on clear diet today by surgery.  AKI; Hypokalemia;  Multifactorial related to medication hypotension.  Improved  with IV fluids.  Hypothyroidism; continue with Synthroid  Mild transaminases;  Follow trend  Hyponatremia;  Start D 5  Depression anxiety on Paxil, resume.   Hypertension Resume Norvasc.   Hypophosphatemia; replete IV>   Acute metabolic encephalopathy, Delirium, hospital and electrolytes  abnormalities. Improved.   Nutrition Problem: Inadequate oral intake Etiology: inability to eat    Signs/Symptoms: NPO status    Interventions: Refer to RD note for recommendations  Estimated body mass index is 39.45 kg/m as calculated from the following:   Height as of this encounter: 4\' 11"  (1.499 m).   Weight as of this encounter: 88.6 kg.   DVT prophylaxis: (Lovenox/Heparin/SCD's/anticoagulated/None (if comfort care) Code Status: (Full/Partial - specify details) Family Communication: (Specify name, relationship & date discussed. NO "discussed with patient") Disposition Plan: (specify when and where you expect patient to be discharged). Include barriers to DC in this tab.   Consultants:  CCS CCM sign off.  Renal   Procedures:  2/14 ETT >> 2/15 2/12 admitted/ OR 2/14 intubated in ICU, AKI / Nephrology consutled.  Brief vasopressors.  2/15 extubated   Antimicrobials: ceftriaxone  Subjective: She denies pain, per husband, patient is not very active, she moves slowly.  They decline CIR>   Objective: Vitals:   07/07/18 1000 07/07/18 1051 07/07/18 2213 07/08/18 0511  BP: 140/74 (!) 147/73 (!) 153/82 (!) 176/99  Pulse:  84 89 79  Resp: 14 15 16 16   Temp:  98.4 F (36.9 C) 97.8 F (36.6 C) (!) 97.5 F (36.4 C)  TempSrc:  Oral Oral Oral  SpO2:  95% 93% 96%  Weight:      Height:        Intake/Output Summary (Last 24 hours) at 07/08/2018 1342 Last data filed at 07/08/2018 0953 Gross per 24 hour  Intake 2607.24 ml  Output 1876 ml  Net 731.24 ml   Filed Weights   07/03/18 1133 07/07/18 0600  Weight: 76.5 kg 88.6 kg  Examination:  General exam: Appears calm and comfortable  Respiratory system: Clear to auscultation. Respiratory effort normal. Cardiovascular system: S1 & S2 heard, RRR. No JVD, murmurs, rubs, gallops or clicks. No pedal edema. Gastrointestinal system: Abdomen with binder, distended.  Central nervous system: Alert and oriented.    Extremities: Symmetric 5 x 5 power. Skin: No rashes, lesions or ulcers   Data Reviewed: I have personally reviewed following labs and imaging studies  CBC: Recent Labs  Lab 07/04/18 0706  07/05/18 0723 07/05/18 1008 07/05/18 1219 07/06/18 0422 07/07/18 0246 07/08/18 0504  WBC 19.8*  --  7.7  --   --  9.2 8.4 9.0  NEUTROABS  --   --   --   --   --   --  6.7  --   HGB 12.1   < > 11.7* 12.2 10.2* 10.0* 10.4* 10.1*  HCT 38.4   < > 35.9* 36.0 30.0* 30.6* 31.1* 32.2*  MCV 90.8  --  90.2  --   --  88.7 87.6 89.9  PLT 327  --  279  --   --  236 232 264   < > = values in this interval not displayed.   Basic Metabolic Panel: Recent Labs  Lab 07/05/18 1120 07/05/18 1219 07/05/18 2159 07/06/18 0422 07/07/18 0246 07/07/18 1943 07/08/18 0504  NA 135 137  --  138 144 148* 149*  K 2.8* 2.8* 3.4* 3.3* 2.6* 4.0 3.9  CL 109  --   --  110 109 116* 115*  CO2 18*  --   --  18* 27 24 24   GLUCOSE 140*  --   --  115* 106* 135* 145*  BUN 46*  --   --  45* 32* 27* 22  CREATININE 1.69*  --   --  1.83* 1.20* 0.85 0.83  CALCIUM 8.5*  --   --  8.2* 9.2 9.4 9.2  MG 1.5*  --   --   --  1.7  --  1.8  PHOS  --   --   --   --  2.2*  --  <1.0*   GFR: Estimated Creatinine Clearance: 60.3 mL/min (by C-G formula based on SCr of 0.83 mg/dL). Liver Function Tests: Recent Labs  Lab 07/05/18 0233 07/06/18 0422 07/07/18 0246  AST 71* 73* 112*  ALT 56* 70* 82*  ALKPHOS 59 55 68  BILITOT 0.8 1.4* 0.7  PROT 6.9 5.7* 6.4*  ALBUMIN 4.0 2.9* 3.0*   No results for input(s): LIPASE, AMYLASE in the last 168 hours. No results for input(s): AMMONIA in the last 168 hours. Coagulation Profile: Recent Labs  Lab 07/07/18 0246  INR 1.07   Cardiac Enzymes: No results for input(s): CKTOTAL, CKMB, CKMBINDEX, TROPONINI in the last 168 hours. BNP (last 3 results) No results for input(s): PROBNP in the last 8760 hours. HbA1C: No results for input(s): HGBA1C in the last 72 hours. CBG: Recent Labs  Lab  07/07/18 1612 07/08/18 0014 07/08/18 0500 07/08/18 0818 07/08/18 1215  GLUCAP 132* 127* 127* 144* 138*   Lipid Profile: No results for input(s): CHOL, HDL, LDLCALC, TRIG, CHOLHDL, LDLDIRECT in the last 72 hours. Thyroid Function Tests: No results for input(s): TSH, T4TOTAL, FREET4, T3FREE, THYROIDAB in the last 72 hours. Anemia Panel: No results for input(s): VITAMINB12, FOLATE, FERRITIN, TIBC, IRON, RETICCTPCT in the last 72 hours. Sepsis Labs: Recent Labs  Lab 07/05/18 2105 07/05/18 2159  LATICACIDVEN 1.6 1.6    Recent Results (from the past 240 hour(s))  Culture,  respiratory (non-expectorated)     Status: None   Collection Time: 07/05/18  2:20 PM  Result Value Ref Range Status   Specimen Description TRACHEAL ASPIRATE  Final   Special Requests NONE  Final   Gram Stain   Final    MODERATE WBC PRESENT, PREDOMINANTLY PMN RARE GRAM POSITIVE RODS RARE GRAM POSITIVE COCCI RARE GRAM NEGATIVE RODS Performed at [facility], [place] [address] [place], [place] [address]    Culture FEW KLEBSIELLA PNEUMONIAE  Final   Report Status 07/08/2018 FINAL  Final   Organism ID, Bacteria KLEBSIELLA PNEUMONIAE  Final      Susceptibility   Klebsiella pneumoniae - MIC*    AMPICILLIN >=32 RESISTANT Resistant     CEFAZOLIN <=4 SENSITIVE Sensitive     CEFEPIME <=1 SENSITIVE Sensitive     CEFTAZIDIME <=1 SENSITIVE Sensitive     CEFTRIAXONE <=1 SENSITIVE Sensitive     CIPROFLOXACIN <=0.25 SENSITIVE Sensitive     GENTAMICIN <=1 SENSITIVE Sensitive     IMIPENEM <=0.25 SENSITIVE Sensitive     TRIMETH/SULFA <=20 SENSITIVE Sensitive     AMPICILLIN/SULBACTAM 4 SENSITIVE Sensitive     PIP/TAZO <=4 SENSITIVE Sensitive     Extended ESBL NEGATIVE Sensitive     * FEW KLEBSIELLA PNEUMONIAE  Culture, blood (routine x 2)     Status: None (Preliminary result)   Collection Time: 07/05/18 10:04 PM  Result Value Ref Range Status   Specimen Description BLOOD RIGHT HAND  Final   Special Requests   Final     BOTTLES DRAWN AEROBIC ONLY Blood Culture adequate volume   Culture   Final    NO GROWTH 3 DAYS Performed at [facility], [address], [place], [place] [address]    Report Status PENDING  Incomplete  Culture, blood (routine x 2)     Status: None (Preliminary result)   Collection Time: 07/05/18 10:14 PM  Result Value Ref Range Status   Specimen Description BLOOD LEFT HAND  Final   Special Requests   Final    BOTTLES DRAWN AEROBIC ONLY Blood Culture results may not be optimal due to an inadequate volume of blood received in culture bottles   Culture   Final    NO GROWTH 3 DAYS Performed at [facility], [place] [address], [place], [place] [address]    Report Status PENDING  Incomplete         Radiology Studies: Dg Abd 1 View  Result Date: 07/07/2018 CLINICAL DATA:  Hernia repair Wednesday. RIGHT lower quadrant pain today. EXAM: ABDOMEN - 1 VIEW COMPARISON:  None. FINDINGS: Enteric tube appears well positioned with tip at the level of the gastric pylorus or duodenal bulb. Bowel gas pattern appears non obstructive. No evidence of abnormal fluid collection. No evidence of free intraperitoneal air. IMPRESSION: 1. Enteric tube well positioned with tip at the level of the gastric pylorus or duodenal bulb. 2. Nonobstructive bowel gas pattern. Electronically Signed   By: Franki Cabot M.D.   On: 07/07/2018 15:02   Dg Chest Port 1 View  Result Date: 07/07/2018 CLINICAL DATA:  Status post extubation EXAM: PORTABLE CHEST 1 VIEW COMPARISON:  July 06, 2018 FINDINGS: Endotracheal tube no longer present. Nasogastric tube tip and side port are below the diaphragm. No pneumothorax. There is atelectatic change in the left base region. There is no frank edema or consolidation. Heart is upper normal in size with pulmonary vascularity normal. No adenopathy. There is aortic atherosclerosis. No evident bone lesions.  IMPRESSION: Nasogastric tube tip and side port below the diaphragm. No  pneumothorax. Left base atelectasis. Lungs elsewhere clear. Stable cardiac silhouette. Aortic Atherosclerosis (ICD10-I70.0). Electronically Signed   By: Lowella Grip III M.D.   On: 07/07/2018 06:11        Scheduled Meds: . enoxaparin (LOVENOX) injection  40 mg Subcutaneous Daily  . levothyroxine  12.5 mcg Intravenous Daily  . mouth rinse  15 mL Mouth Rinse BID  . metroNIDAZOLE  500 mg Oral Q8H  . multivitamin with minerals  1 tablet Oral Daily  . pneumococcal 23 valent vaccine  0.5 mL Intramuscular Tomorrow-1000  . vitamin B-12  1,000 mcg Oral Daily  . Vitamin D (Ergocalciferol)  50,000 Units Oral Q Sun   Continuous Infusions: . sodium chloride 10 mL/hr at 07/07/18 0700  . cefTRIAXone (ROCEPHIN)  IV 1 g (07/07/18 2005)  . dextrose 75 mL/hr at 07/08/18 1042  . famotidine (PEPCID) IV 20 mg (07/08/18 1050)  . sodium phosphate  Dextrose 5% IVPB 30 mmol (07/08/18 0858)     LOS: 4 days    Time spent: 35 minutes.     Elmarie Shiley, MD [facility]  07/08/2018, 1:42 PM

## 2018-07-08 NOTE — Evaluation (Signed)
Clinical/Bedside Swallow Evaluation Patient Details  Name: Elizabeth Mcclain MRN: [id number] Date of Birth: Feb 03, 1946  Today's Date: 07/08/2018 Time: SLP Start Time (ACUTE ONLY): 7829 SLP Stop Time (ACUTE ONLY): 1445 SLP Time Calculation (min) (ACUTE ONLY): 30 min  Past Medical History:  Past Medical History:  Diagnosis Date  . Anxiety   . Arthritis   . Blood dyscrasia    Factor V Leiden  . Cancer ([place])    skin  . Depression   . GERD (gastroesophageal reflux disease)    takes tums or rolaids  . Hypertension   . Hypothyroidism   . Occasional tremors   . Reflux   . Thyroid disease    Past Surgical History:  Past Surgical History:  Procedure Laterality Date  . ABDOMINAL HYSTERECTOMY     partial  . BLADDER REPAIR    . CHOLECYSTECTOMY    . INCISIONAL HERNIA REPAIR N/A 07/03/2018   Procedure: LAPAROSCOPIC INCISIONAL HERNIA REPAIR WITH MESH;  Surgeon: Erroll Luna, MD;  Location: [facility];  Service: General;  Laterality: N/A;  . INSERTION OF MESH N/A 05/30/2016   Procedure: INSERTION OF MESH;  Surgeon: Erroll Luna, MD;  Location: [facility];  Service: General;  Laterality: N/A;  . KNEE SURGERY Bilateral   . RECTOCELE REPAIR    . SHOULDER ARTHROSCOPY Bilateral   . UMBILICAL HERNIA REPAIR N/A 05/30/2016   Procedure: UMBILICAL HERNIA REPAIR;  Surgeon: Erroll Luna, MD;  Location: [facility];  Service: General;  Laterality: N/A;   HPI:  73 year old female admitted 07/04/2018 for laparoscopic repair of incisional hernia. Pt developed N/V post-op, with respiratory distress and hypoxia, and "gurgling with poor cough concerning for ongoing aspiration event" PMH: umbilical hernia, HTN, hypothyroid. Pt was intubated 2/14-15/2020.   Assessment / Plan / Recommendation Clinical Impression  Pt presents with hoarse voice quality, s/p extubation, which raises concern for reduced glottic closure and airway protection. Oral motor examination is WFL. Pt accepted  trials of thin liquid, puree, and solid. No obvious oral issues were observed, and no overt s/s aspiration elicited on any consistency. Will recommend advancing diet to regular/thin per surgery. SLP will follow for assessment of diet tolerance and education, and determine if further work up is needed. Safe swallow precautions were reviewed with pt, and posted at [facility].  SLP Visit Diagnosis: Dysphagia, unspecified (R13.10)    Aspiration Risk  Mild aspiration risk;Moderate aspiration risk    Diet Recommendation Regular;Thin liquid   Liquid Administration via: Cup;Straw Medication Administration: Whole meds with liquid Supervision: Patient able to self feed Compensations: Minimize environmental distractions;Small sips/bites;Slow rate Postural Changes: Seated upright at 90 degrees    Other  Recommendations Oral Care Recommendations: Oral care QID   Follow up Recommendations (TBD)      Frequency and Duration min 2x/week  1 week       Prognosis Prognosis for Safe Diet Advancement: Good      Swallow Study   General Date of Onset: 07/04/18 HPI: 73 year old female admitted 07/04/2018 for laparoscopic repair of incisional hernia. Pt developed N/V post-op, with respiratory distress and hypoxia, and "gurgling with poor cough concerning for ongoing aspiration event" PMH: umbilical hernia, HTN, hypothyroid. Pt was intubated 2/14-15/2020. Type of Study: Bedside Swallow Evaluation Previous Swallow Assessment: none found Diet Prior to this Study: Thin liquids(clear liquids) Temperature Spikes Noted: No Respiratory Status: Room air History of Recent Intubation: Yes Length of Intubations (days): 1 days Date extubated: 07/06/18 Behavior/Cognition: Alert;Cooperative;Pleasant mood;Confused Oral Cavity Assessment:  Within Functional Limits Oral Care Completed by SLP: No Oral Cavity - Dentition: Adequate natural dentition Vision: Functional for self-feeding Self-Feeding Abilities: Able to feed  self Patient Positioning: Upright in chair Baseline Vocal Quality: Hoarse Volitional Cough: Weak Volitional Swallow: Able to elicit    Oral/Motor/Sensory Function Overall Oral Motor/Sensory Function: Within functional limits   Ice Chips Ice chips: Within functional limits Presentation: Spoon   Thin Liquid Thin Liquid: Within functional limits Presentation: Cup;Straw    Nectar Thick Nectar Thick Liquid: Not tested   Honey Thick Honey Thick Liquid: Not tested   Puree Puree: Within functional limits Presentation: Self Fed   Solid     Solid: Within functional limits Presentation: [place] [facility] [place], [facility], [place] Speech Language Pathologist [phone]  Shonna Chock 07/08/2018,2:56 PM

## 2018-07-08 NOTE — Progress Notes (Signed)
5 Days Post-Op   Subjective/Chief Complaint: SITTING IN CHAIR  Confused  Low phos  Being replaced   Had 2 BM yesterday  KUB normal    Objective: Vital signs in last 24 hours: Temp:  [97.5 F (36.4 C)-98.6 F (37 C)] 97.5 F (36.4 C) (02/17 0511) Pulse Rate:  [79-89] 79 (02/17 0511) Resp:  [14-16] 16 (02/17 0511) BP: (140-176)/(73-99) 176/99 (02/17 0511) SpO2:  [93 %-96 %] 96 % (02/17 0511) Last BM Date: 07/07/18  Intake/Output from previous day: 02/16 0701 - 02/17 0700 In: 2587.1 [P.O.:60; I.V.:1977.1; IV Piggyback:550] Out: 1952 [Urine:875; Emesis/NG output:1075; Stool:2] Intake/Output this shift: No intake/output data recorded.  General appearance: fatigued and slowed mentation Resp: clear to auscultation bilaterally Cardio: regular rate and rhythm, S1, S2 normal, no murmur, click, rub or gallop Incision/Wound:CDI port sites ND  NT  Lab Results:  Recent Labs    07/07/18 0246 07/08/18 0504  WBC 8.4 9.0  HGB 10.4* 10.1*  HCT 31.1* 32.2*  PLT 232 264   BMET Recent Labs    07/07/18 1943 07/08/18 0504  NA 148* 149*  K 4.0 3.9  CL 116* 115*  CO2 24 24  GLUCOSE 135* 145*  BUN 27* 22  CREATININE 0.85 0.83  CALCIUM 9.4 9.2   PT/INR Recent Labs    07/07/18 0246  LABPROT 13.8  INR 1.07   ABG Recent Labs    07/05/18 1008 07/05/18 1219  PHART 7.101* 7.236*  HCO3 20.0 20.0    Studies/Results: Dg Abd 1 View  Result Date: 07/07/2018 CLINICAL DATA:  Hernia repair Wednesday. RIGHT lower quadrant pain today. EXAM: ABDOMEN - 1 VIEW COMPARISON:  None. FINDINGS: Enteric tube appears well positioned with tip at the level of the gastric pylorus or duodenal bulb. Bowel gas pattern appears non obstructive. No evidence of abnormal fluid collection. No evidence of free intraperitoneal air. IMPRESSION: 1. Enteric tube well positioned with tip at the level of the gastric pylorus or duodenal bulb. 2. Nonobstructive bowel gas pattern. Electronically Signed   By: Franki Cabot M.D.   On: 07/07/2018 15:02   Dg Chest Port 1 View  Result Date: 07/07/2018 CLINICAL DATA:  Status post extubation EXAM: PORTABLE CHEST 1 VIEW COMPARISON:  July 06, 2018 FINDINGS: Endotracheal tube no longer present. Nasogastric tube tip and side port are below the diaphragm. No pneumothorax. There is atelectatic change in the left base region. There is no frank edema or consolidation. Heart is upper normal in size with pulmonary vascularity normal. No adenopathy. There is aortic atherosclerosis. No evident bone lesions. IMPRESSION: Nasogastric tube tip and side port below the diaphragm. No pneumothorax. Left base atelectasis. Lungs elsewhere clear. Stable cardiac silhouette. Aortic Atherosclerosis (ICD10-I70.0). Electronically Signed   By: Lowella Grip III M.D.   On: 07/07/2018 06:11   [place] Ekg Site Rite  Result Date: 07/06/2018 If Site Rite image not attached, placement could not be confirmed due to current cardiac rhythm.   Anti-infectives: Anti-infectives (From admission, onward)   Start     Dose/Rate Route Frequency Ordered Stop   07/06/18 0815  cefTRIAXone (ROCEPHIN) 1 g in sodium chloride 0.9 % 100 mL IVPB  Status:  Discontinued     1 g 200 mL/hr over 30 Minutes Intravenous Every 24 hours 07/05/18 0815 07/05/18 1836   07/05/18 2000  cefTRIAXone (ROCEPHIN) 1 g in sodium chloride 0.9 % 100 mL IVPB     1 g 200 mL/hr over 30 Minutes Intravenous Every 24 hours 07/05/18 1941  07/05/18 2000  metroNIDAZOLE (FLAGYL) IVPB 500 mg     500 mg 100 mL/hr over 60 Minutes Intravenous Every 8 hours 07/05/18 1941     07/05/18 0815  cefTRIAXone (ROCEPHIN) 2 g in sodium chloride 0.9 % 100 mL IVPB  Status:  Discontinued     2 g 200 mL/hr over 30 Minutes Intravenous Every 24 hours 07/05/18 0815 07/05/18 0815   07/03/18 1130  clindamycin (CLEOCIN) IVPB 900 mg     900 mg 100 mL/hr over 30 Minutes Intravenous On call to O.R. 07/03/18 1125 07/03/18 1310      Assessment/Plan: s/p  Procedure(s): LAPAROSCOPIC INCISIONAL HERNIA REPAIR WITH MESH (N/A) Patient Active Problem List   Diagnosis Date Noted  . Acute respiratory failure with hypoxia ([place])   . Incisional hernia without obstruction or gangrene 07/03/2018    AKI- Cr normal and good UOP D/C foley   Ileus - bowel function back NG mostly bilious remove NGT allow clears    FEN- replace phos  K improved  Allow clears  Recheck labs in am    MS- slowed but more than like ly secondary to ICU and intubation/ sedation  AMBULATE   LOS: 4 days    [name] A Aarron Wierzbicki 07/08/2018

## 2018-07-08 NOTE — Evaluation (Signed)
Physical Therapy Evaluation Patient Details Name: Elizabeth Mcclain MRN: [id number] DOB: 06/20/1945 Today's Date: 07/08/2018   History of Present Illness  Pt is a 73 y.o. female who was in ICU for 5 days, s/p incisional hernia repair. PMH of acute respiratory failure.  Clinical Impression  Pt received up in chair, willing to participate in therapy. She is getting ready to eat her first meal. Sit to stand transfers with min guard to minA, ambulation with minA for RW management (see below for details). Noted safety concerns throughout session- pt requiring max cues for safety precautions, redirection while ambulating. She is alert and oriented x4; however noted confusion and increased time and difficulty following cues as well as decreased safety awareness. Functional mobility is significantly decreased from her baseline, so recommending CIR so pt is able to safely regain her functional mobility and return home. Pt is currently refusing CIR, so if she continues to refuse, she will require 24 hour supervision at home and home health PT to ensure safety and improve mobility. Extensive education provided on reasoning for CIR recommendation and benefits of intensive rehab. Pt will benefit from continued skilled acute PT to improve functional mobility to ensure safe DC.    Follow Up Recommendations CIR;Supervision/Assistance - 24 hour;Home health PT(if pt refuses CIR, she will require 24 hour supervision and HHPT)    Equipment Recommendations  Other (comment)(Pt has necessary equipment)    Recommendations for Other Services       Precautions / Restrictions Restrictions Weight Bearing Restrictions: No      Mobility  Bed Mobility               General bed mobility comments: Did not assess- pt received in chair  Transfers Overall transfer level: Needs assistance Equipment used: Rolling walker (2 wheeled) Transfers: Sit to/from Stand Sit to Stand: Min guard;Min assist         General  transfer comment: Pt able to stand from chair with min guard on first attempt, minA for boost on second attempt. Requiring max v/c and tactile cues for safe hand placement.  Ambulation/Gait Ambulation/Gait assistance: Min assist Gait Distance (Feet): 60 Feet Assistive device: Rolling walker (2 wheeled) Gait Pattern/deviations: Step-through pattern;Decreased step length - right;Decreased step length - left;Drifts right/left;Trunk flexed;Narrow base of support Gait velocity: decreased Gait velocity interpretation: <1.31 ft/sec, indicative of household ambulator General Gait Details: Pt ambulates 30 ft x 2 with seated rest break. She required multiple v/c and physical assist for RW management. No LOB, but requiring redirection multiple times to avoid walking into wall.  Stairs            Wheelchair Mobility    Modified Rankin (Stroke Patients Only)       Balance Overall balance assessment: Needs assistance Sitting-balance support: Bilateral upper extremity supported;Feet supported Sitting balance-Leahy Scale: Fair     Standing balance support: Bilateral upper extremity supported;During functional activity Standing balance-Leahy Scale: Poor Standing balance comment: Pt requires support of RW to maintain balance while walking.                             Pertinent Vitals/Pain Pain Assessment: No/denies pain    Home Living Family/patient expects to be discharged to:: Private residence Living Arrangements: Spouse/significant other Available Help at Discharge: Family;Available PRN/intermittently Type of Home: House Home Access: Ramped entrance     Home Layout: One level Home Equipment: Bedside commode;Walker - 2 wheels  Prior Function Level of Independence: Independent         Comments: Pt was able to ambulate with no AD, has RW and BSC from previous knee surgery. She reports she was walking mostly household distances, but husband reports she was able  to walk community distance.     Hand Dominance        Extremity/Trunk Assessment   Upper Extremity Assessment Upper Extremity Assessment: Generalized weakness    Lower Extremity Assessment Lower Extremity Assessment: Generalized weakness    Cervical / Trunk Assessment Cervical / Trunk Assessment: Kyphotic  Communication   Communication: No difficulties  Cognition Arousal/Alertness: Lethargic Behavior During Therapy: Flat affect Overall Cognitive Status: Impaired/Different from baseline Area of Impairment: Following commands;Attention                   Current Attention Level: Selective   Following Commands: Follows one step commands inconsistently;Follows one step commands with increased time       General Comments: Pt alert and oriented x4 but appears confused and unable to make decisions.      General Comments      Exercises     Assessment/Plan    PT Assessment Patient needs continued PT services  PT Problem List Decreased strength;Decreased balance;Decreased cognition;Decreased knowledge of precautions;Decreased range of motion;Decreased mobility;Decreased activity tolerance;Decreased coordination;Decreased safety awareness       PT Treatment Interventions DME instruction;Functional mobility training;Balance training;Patient/family education;Gait training;Therapeutic activities;Neuromuscular re-education;Therapeutic exercise;Cognitive remediation    PT Goals (Current goals can be found in the Care Plan section)  Acute Rehab PT Goals Patient Stated Goal: go home PT Goal Formulation: With patient/family Time For Goal Achievement: 07/22/18 Potential to Achieve Goals: Fair    Frequency Min 3X/week   Barriers to discharge Decreased caregiver support Husband works 8 hours during the day.    Co-evaluation               AM-PAC PT "6 Clicks" Mobility  Outcome Measure Help needed turning from your back to your side while in a flat bed without  using bedrails?: A Little Help needed moving from lying on your back to sitting on the side of a flat bed without using bedrails?: A Little Help needed moving to and from a bed to a chair (including a wheelchair)?: A Little Help needed standing up from a chair using your arms (e.g., wheelchair or bedside chair)?: A Little Help needed to walk in hospital room?: A Little Help needed climbing 3-5 steps with a railing? : A Lot 6 Click Score: 17    End of Session Equipment Utilized During Treatment: Gait belt Activity Tolerance: Patient tolerated treatment well;Patient limited by fatigue Patient left: with family/visitor present;in chair;with call bell/phone within reach   PT Visit Diagnosis: Unsteadiness on feet (R26.81);Muscle weakness (generalized) (M62.81)    Time:  -      Charges:              Ronnell Guadalajara, SPT   Ronnell Guadalajara 07/08/2018, 12:41 PM

## 2018-07-08 NOTE — Care Management Important Message (Signed)
Important Message  Patient Details  Name: Elizabeth Mcclain MRN: [id number] Date of Birth: [birth year]   Medicare Important Message Given:  Yes    Orbie Pyo 07/08/2018, 4:04 PM

## 2018-07-08 NOTE — Progress Notes (Signed)
CRITICAL VALUE ALERT  Critical Value:  Phosphorus <1.0  Date & Time Notied:  0650 07/08/18  Provider Notified: yes  Orders Received/Actions taken: no new orders given

## 2018-07-09 DIAGNOSIS — R0902 Hypoxemia: Secondary | ICD-10-CM

## 2018-07-09 LAB — CBC
HCT: 32.5 % — ABNORMAL LOW (ref 36.0–46.0)
Hemoglobin: 10.1 g/dL — ABNORMAL LOW (ref 12.0–15.0)
MCH: 28.4 pg (ref 26.0–34.0)
MCHC: 31.1 g/dL (ref 30.0–36.0)
MCV: 91.3 fL (ref 80.0–100.0)
NRBC: 0.4 % — AB (ref 0.0–0.2)
Platelets: 234 10*3/uL (ref 150–400)
RBC: 3.56 MIL/uL — ABNORMAL LOW (ref 3.87–5.11)
RDW: 15.5 % (ref 11.5–15.5)
WBC: 9.5 10*3/uL (ref 4.0–10.5)

## 2018-07-09 LAB — GLUCOSE, CAPILLARY
GLUCOSE-CAPILLARY: 102 mg/dL — AB (ref 70–99)
Glucose-Capillary: 109 mg/dL — ABNORMAL HIGH (ref 70–99)
Glucose-Capillary: 112 mg/dL — ABNORMAL HIGH (ref 70–99)
Glucose-Capillary: 120 mg/dL — ABNORMAL HIGH (ref 70–99)
Glucose-Capillary: 112 mg/dL — ABNORMAL HIGH (ref 70–99)

## 2018-07-09 LAB — BASIC METABOLIC PANEL
ANION GAP: 10 (ref 5–15)
BUN: 17 mg/dL (ref 8–23)
CO2: 25 mmol/L (ref 22–32)
Calcium: 8.9 mg/dL (ref 8.9–10.3)
Chloride: 106 mmol/L (ref 98–111)
Creatinine, Ser: 0.79 mg/dL (ref 0.44–1.00)
GFR calc Af Amer: >60 mL/min (ref >60)
GFR calc non Af Amer: >60 mL/min (ref >60)
Glucose, Bld: 123 mg/dL — ABNORMAL HIGH (ref 70–99)
Potassium: 3.1 mmol/L — ABNORMAL LOW (ref 3.5–5.1)
Sodium: 141 mmol/L (ref 135–145)

## 2018-07-09 LAB — HEPATIC FUNCTION PANEL
ALT: 69 U/L — ABNORMAL HIGH (ref 0–44)
AST: 58 U/L — ABNORMAL HIGH (ref 15–41)
Albumin: 2.9 g/dL — ABNORMAL LOW (ref 3.5–5.0)
Alkaline Phosphatase: 69 U/L (ref 38–126)
Bilirubin, Direct: 0.1 mg/dL (ref 0.0–0.2)
Indirect Bilirubin: 0.4 mg/dL (ref 0.3–0.9)
Total Bilirubin: 0.5 mg/dL (ref 0.3–1.2)
Total Protein: 5.8 g/dL — ABNORMAL LOW (ref 6.5–8.1)

## 2018-07-09 LAB — MAGNESIUM: Magnesium: 1.4 mg/dL — ABNORMAL LOW (ref 1.7–2.4)

## 2018-07-09 LAB — PHOSPHORUS: Phosphorus: 2.9 mg/dL (ref 2.5–4.6)

## 2018-07-09 MED ORDER — POLYETHYLENE GLYCOL 3350 17 G PO PACK
17.0000 g | PACK | Freq: Every day | ORAL | Status: DC
  Administered 2018-07-10 – 2018-07-11 (×2): 17 g via ORAL
  Filled 2018-07-09 (×2): qty 1

## 2018-07-09 MED ORDER — POTASSIUM CHLORIDE CRYS ER 20 MEQ PO TBCR
40.0000 meq | EXTENDED_RELEASE_TABLET | Freq: Two times a day (BID) | ORAL | Status: AC
  Administered 2018-07-09 (×2): 40 meq via ORAL
  Filled 2018-07-09 (×2): qty 2

## 2018-07-09 MED ORDER — MAGNESIUM SULFATE 2 GM/50ML IV SOLN
2.0000 g | Freq: Once | INTRAVENOUS | Status: AC
  Administered 2018-07-09: 2 g via INTRAVENOUS
  Filled 2018-07-09: qty 50

## 2018-07-09 MED ORDER — HYDRALAZINE HCL 20 MG/ML IJ SOLN: 10.0000 mg | Freq: Three times a day (TID) | INTRAMUSCULAR | Status: DC | PRN

## 2018-07-09 MED ORDER — AMLODIPINE BESYLATE 10 MG PO TABS
10.0000 mg | ORAL_TABLET | Freq: Every day | ORAL | Status: DC
  Administered 2018-07-09 – 2018-07-11 (×3): 10 mg via ORAL
  Filled 2018-07-09 (×3): qty 1

## 2018-07-09 MED ORDER — DEXTROSE 5 % IV SOLN
INTRAVENOUS | Status: DC
  Administered 2018-07-09: 12:00:00 via INTRAVENOUS

## 2018-07-09 MED ORDER — ENSURE ENLIVE PO LIQD
237.0000 mL | Freq: Two times a day (BID) | ORAL | Status: DC
  Administered 2018-07-09 – 2018-07-11 (×4): 237 mL via ORAL

## 2018-07-09 NOTE — Progress Notes (Signed)
  Speech Language Pathology Treatment: Dysphagia  Patient Details Name: Elizabeth Mcclain MRN: [id number] DOB: 1945-12-09 Today's Date: 07/09/2018 Time: 5035-4656 SLP Time Calculation (min) (ACUTE ONLY): 12 min  Assessment / Plan / Recommendation Clinical Impression  Pt was seen for dysphagia treatment to assess tolerance of the recommended. Pt was evaluated by speech pathology on 07/08/18 and a regular texture diet with thin liquids was recommended at that time. Based on medical record, the pt has been upgraded from clear liquids to a full liquid diet and [name], RN indicated that the team will likely upgrade the pt to a regular consistency once she has demonstrated tolerance of the full liquid consistency. She was seen during lunch and tolerated puree and consecutive swallows of thin liquids without symptoms of oropharyngeal dysphagia. SLP will see pt again once her diet has been upgraded to regular consistency to ensure tolerance.    HPI HPI: 73 year old female admitted 07/04/2018 for laparoscopic repair of incisional hernia. Pt developed N/V post-op, with respiratory distress and hypoxia, and "gurgling with poor cough concerning for ongoing aspiration event" PMH: umbilical hernia, HTN, hypothyroid. Pt was intubated 2/14-15/2020.      SLP Plan  Continue with current plan of care       Recommendations  Diet recommendations: Regular;Thin liquid Liquids provided via: Straw Medication Administration: Whole meds with liquid Supervision: Patient able to self feed Compensations: Small sips/bites Postural Changes and/or Swallow Maneuvers: Seated upright 90 degrees;Upright 30-60 min after meal                Oral Care Recommendations: Oral care BID Follow up Recommendations: None SLP Visit Diagnosis: Dysphagia, unspecified (R13.10) Plan: Continue with current plan of care       Aubery Douthat I. Hardin Negus, [place], [place] Office number [phone] Pager  [place] 07/09/2018, 1:52 PM

## 2018-07-09 NOTE — Progress Notes (Signed)
PROGRESS NOTE    Elizabeth Mcclain  YTK:[id number] DOB: 1946-02-28 DOA: 07/03/2018 PCP: [facility]    Brief Narrative: 73 year old with past medical history significant for hypertension, hypothyroidism, prior umbilical hernia repair in January/2018 who developed mid discomfort and increased bulging at the site.  Admitted and underwent laparoscopic hernia repair with mesh and 2/12 that went well, but was complicated by 2 episode of hypotension.  Patient was noted to be on Lasix and hydrochlorothiazide, Celebrex and losartan prior to surgery.  Labs on 2/13 noted for AKI, nephrology was consulted.  KUB 2/13 show acute ileus.  Patient overnight developed nausea and severe episode of vomiting followed by respiratory distress and hypoxemia requiring NRB.  An NG tube was placed and return 1.5 L output.  Patient continued to have hypoxemia and gurgling with poor cough concerning for ongoing aspiration event.  Patient to move to ICU under CCM care, intubated on 12/14 and subsequently extubated on 2/15.  Patient has been receiving treatment for pneumonia.  Her respiratory status is a stable post extubation.  Post operative ileus has resolved.  Plan to advance diet today.   Assessment & Plan:   Active Problems:   Incisional hernia without obstruction or gangrene   Acute respiratory failure with hypoxia ([facility])  Acute respiratory failure; secondary to PNA.  Aspiration vs HCAP.  Patient intubated on 214 and subsequently extubated on 2/15. She is now on room air. Continue with ceftriaxone and Flagyl.  Day  5 of antibiotic. She passed a swallow evaluation.Lurline Idol aspirate few klebsiella.  Sensitive to ceftriaxone  Ileus; Laparoscopic Hernia Repair with Mesh 2-12.  NG tube removed.   Diet advanced to full liquid diet today.  AKI; Hypokalemia;  Multifactorial related to medication hypotension.  Improved  with IV fluids. Oral potassium supplementation.  Hypothyroidism; continue with  Synthroid replete IV>  Hypomagnesemia;   Mild transaminases;  Follow trend  Hyponatremia;  Started  D 5.  Improved Stop fluids when improve oral intake..  Depression anxiety on Paxil, resume.   Hypertension Blood pressure elevated, will increase Norvasc to 10 mg daily.  Hypophosphatemia; replaced IV> Normalized.  Repeat in a.m.  Acute metabolic encephalopathy, Delirium, hospital and electrolytes abnormalities. Improved.   Nutrition Problem: Inadequate oral intake Etiology: inability to eat    Signs/Symptoms: NPO status    Interventions: Refer to RD note for recommendations  Estimated body mass index is 39.45 kg/m as calculated from the following:   Height as of this encounter: 4\' 11"  (1.499 m).   Weight as of this encounter: 88.6 kg.   DVT prophylaxis: Lovenox Code Status: Full code Family Communication: Husband at bedside.  Disposition Plan: home when tolerating regular diet. Family wants HH. Decline CIR, SNF  Consultants:  CCS CCM sign off.  Renal   Procedures:  2/14 ETT >> 2/15 2/12 admitted/ OR 2/14 intubated in ICU, AKI / Nephrology consutled.  Brief vasopressors.  2/15 extubated   Antimicrobials: ceftriaxone  Subjective: She is alert. She went to bathroom with husband assistance.   Objective: Vitals:   07/08/18 0511 07/08/18 1343 07/08/18 2152 07/09/18 0409  BP: (!) 176/99 (!) 170/79 (!) 155/82 (!) 172/81  Pulse: 79 74 77 79  Resp: 16 18 17 18   Temp: (!) 97.5 F (36.4 C) 98.5 F (36.9 C) 98.8 F (37.1 C) 99 F (37.2 C)  TempSrc: Oral Oral Oral Oral  SpO2: 96% 97% 96% 94%  Weight:      Height:        Intake/Output  Summary (Last 24 hours) at 07/09/2018 0823 Last data filed at 07/09/2018 0419 Gross per 24 hour  Intake 1098.2 ml  Output 750 ml  Net 348.2 ml   Filed Weights   07/03/18 1133 07/07/18 0600  Weight: 76.5 kg 88.6 kg    Examination:  General exam: NAD Respiratory system: CTA Cardiovascular system: S 1, S 2  RRR Gastrointestinal system:Abdomen with binder, soft, obese Central nervous system: alert and oriented Extremities: symmetric power.  Skin: no rashes   Data Reviewed: I have personally reviewed following labs and imaging studies  CBC: Recent Labs  Lab 07/05/18 0723  07/05/18 1219 07/06/18 0422 07/07/18 0246 07/08/18 0504 07/09/18 0410  WBC 7.7  --   --  9.2 8.4 9.0 9.5  NEUTROABS  --   --   --   --  6.7  --   --   HGB 11.7*   < > 10.2* 10.0* 10.4* 10.1* 10.1*  HCT 35.9*   < > 30.0* 30.6* 31.1* 32.2* 32.5*  MCV 90.2  --   --  88.7 87.6 89.9 91.3  PLT 279  --   --  236 232 264 234   < > = values in this interval not displayed.   Basic Metabolic Panel: Recent Labs  Lab 07/05/18 1120  07/06/18 0422 07/07/18 0246 07/07/18 1943 07/08/18 0504 07/09/18 0410  NA 135   < > 138 144 148* 149* 141  K 2.8*   < > 3.3* 2.6* 4.0 3.9 3.1*  CL 109  --  110 109 116* 115* 106  CO2 18*  --  18* 27 24 24 25   GLUCOSE 140*  --  115* 106* 135* 145* 123*  BUN 46*  --  45* 32* 27* 22 17  CREATININE 1.69*  --  1.83* 1.20* 0.85 0.83 0.79  CALCIUM 8.5*  --  8.2* 9.2 9.4 9.2 8.9  MG 1.5*  --   --  1.7  --  1.8 1.4*  PHOS  --   --   --  2.2*  --  <1.0* 2.9   < > = values in this interval not displayed.   GFR: Estimated Creatinine Clearance: 62.5 mL/min (by C-G formula based on SCr of 0.79 mg/dL). Liver Function Tests: Recent Labs  Lab 07/05/18 0233 07/06/18 0422 07/07/18 0246 07/09/18 0410  AST 71* 73* 112* 58*  ALT 56* 70* 82* 69*  ALKPHOS 59 55 68 69  BILITOT 0.8 1.4* 0.7 0.5  PROT 6.9 5.7* 6.4* 5.8*  ALBUMIN 4.0 2.9* 3.0* 2.9*   No results for input(s): LIPASE, AMYLASE in the last 168 hours. No results for input(s): AMMONIA in the last 168 hours. Coagulation Profile: Recent Labs  Lab 07/07/18 0246  INR 1.07   Cardiac Enzymes: No results for input(s): CKTOTAL, CKMB, CKMBINDEX, TROPONINI in the last 168 hours. BNP (last 3 results) No results for input(s): PROBNP in the last  8760 hours. HbA1C: No results for input(s): HGBA1C in the last 72 hours. CBG: Recent Labs  Lab 07/08/18 1215 07/08/18 1625 07/08/18 2048 07/08/18 2357 07/09/18 0404  GLUCAP 138* 122* 99 102* 102*   Lipid Profile: No results for input(s): CHOL, HDL, LDLCALC, TRIG, CHOLHDL, LDLDIRECT in the last 72 hours. Thyroid Function Tests: No results for input(s): TSH, T4TOTAL, FREET4, T3FREE, THYROIDAB in the last 72 hours. Anemia Panel: No results for input(s): VITAMINB12, FOLATE, FERRITIN, TIBC, IRON, RETICCTPCT in the last 72 hours. Sepsis Labs: Recent Labs  Lab 07/05/18 2105 07/05/18 2159  LATICACIDVEN 1.6 1.6  Recent Results (from the past 240 hour(s))  Culture, respiratory (non-expectorated)     Status: None   Collection Time: 07/05/18  2:20 PM  Result Value Ref Range Status   Specimen Description TRACHEAL ASPIRATE  Final   Special Requests NONE  Final   Gram Stain   Final    MODERATE WBC PRESENT, PREDOMINANTLY PMN RARE GRAM POSITIVE RODS RARE GRAM POSITIVE COCCI RARE GRAM NEGATIVE RODS Performed at [facility], [place] [address], [place], [place] [address]    Culture FEW KLEBSIELLA PNEUMONIAE  Final   Report Status 07/08/2018 FINAL  Final   Organism ID, Bacteria KLEBSIELLA PNEUMONIAE  Final      Susceptibility   Klebsiella pneumoniae - MIC*    AMPICILLIN >=32 RESISTANT Resistant     CEFAZOLIN <=4 SENSITIVE Sensitive     CEFEPIME <=1 SENSITIVE Sensitive     CEFTAZIDIME <=1 SENSITIVE Sensitive     CEFTRIAXONE <=1 SENSITIVE Sensitive     CIPROFLOXACIN <=0.25 SENSITIVE Sensitive     GENTAMICIN <=1 SENSITIVE Sensitive     IMIPENEM <=0.25 SENSITIVE Sensitive     TRIMETH/SULFA <=20 SENSITIVE Sensitive     AMPICILLIN/SULBACTAM 4 SENSITIVE Sensitive     PIP/TAZO <=4 SENSITIVE Sensitive     Extended ESBL NEGATIVE Sensitive     * FEW KLEBSIELLA PNEUMONIAE  Culture, blood (routine x 2)     Status: None (Preliminary result)   Collection Time: 07/05/18 10:04 PM  Result  Value Ref Range Status   Specimen Description BLOOD RIGHT HAND  Final   Special Requests   Final    BOTTLES DRAWN AEROBIC ONLY Blood Culture adequate volume   Culture   Final    NO GROWTH 3 DAYS Performed at [facility], [address] [place], [place] [address]    Report Status PENDING  Incomplete  Culture, blood (routine x 2)     Status: None (Preliminary result)   Collection Time: 07/05/18 10:14 PM  Result Value Ref Range Status   Specimen Description BLOOD LEFT HAND  Final   Special Requests   Final    BOTTLES DRAWN AEROBIC ONLY Blood Culture results may not be optimal due to an inadequate volume of blood received in culture bottles   Culture   Final    NO GROWTH 3 DAYS Performed at [facility], [place] [address], [place], [place] [address]    Report Status PENDING  Incomplete         Radiology Studies: Dg Abd 1 View  Result Date: 07/07/2018 CLINICAL DATA:  Hernia repair Wednesday. RIGHT lower quadrant pain today. EXAM: ABDOMEN - 1 VIEW COMPARISON:  None. FINDINGS: Enteric tube appears well positioned with tip at the level of the gastric pylorus or duodenal bulb. Bowel gas pattern appears non obstructive. No evidence of abnormal fluid collection. No evidence of free intraperitoneal air. IMPRESSION: 1. Enteric tube well positioned with tip at the level of the gastric pylorus or duodenal bulb. 2. Nonobstructive bowel gas pattern. Electronically Signed   By: Franki Cabot M.D.   On: 07/07/2018 15:02        Scheduled Meds: . amLODipine  10 mg Oral Daily  . enoxaparin (LOVENOX) injection  40 mg Subcutaneous Daily  . levothyroxine  25 mcg Oral Q0600  . mouth rinse  15 mL Mouth Rinse BID  . metroNIDAZOLE  500 mg Oral Q8H  . multivitamin with minerals  1 tablet Oral Daily  . PARoxetine  40 mg Oral Daily  .  pneumococcal 23 valent vaccine  0.5 mL Intramuscular Tomorrow-1000  . polyethylene glycol  17 g Oral Daily  . potassium chloride  40 mEq Oral BID  . vitamin  B-12  1,000 mcg Oral Daily  . Vitamin D (Ergocalciferol)  50,000 Units Oral Q Sun   Continuous Infusions: . sodium chloride Stopped (07/07/18 0747)  . cefTRIAXone (ROCEPHIN)  IV 1 g (07/08/18 2157)  . dextrose    . famotidine (PEPCID) IV Stopped (07/08/18 1120)  . magnesium sulfate 1 - 4 g bolus IVPB       LOS: 5 days    Time spent: 35 minutes.     Elmarie Shiley, MD [facility]  07/09/2018, 8:23 AM

## 2018-07-09 NOTE — Progress Notes (Signed)
6 Days Post-Op   Subjective/Chief Complaint: Less confused this am Feels better  Ambulated yesterday    Objective: Vital signs in last 24 hours: Temp:  [98.5 F (36.9 C)-99 F (37.2 C)] 99 F (37.2 C) (02/18 0409) Pulse Rate:  [74-79] 79 (02/18 0409) Resp:  [17-18] 18 (02/18 0409) BP: (155-172)/(79-82) 172/81 (02/18 0409) SpO2:  [94 %-97 %] 94 % (02/18 0409) Last BM Date: 07/07/18  Intake/Output from previous day: 02/17 0701 - 02/18 0700 In: 1098.2 [P.O.:437; I.V.:351.2; IV Piggyback:310] Out: 750 [Urine:750] Intake/Output this shift: No intake/output data recorded.  General appearance: alert and cooperative Resp: clear to auscultation bilaterally Cardio: regular rate and rhythm, S1, S2 normal, no murmur, click, rub or gallop Incision/Wound:CSI soft NT ND  Lab Results:  Recent Labs    07/08/18 0504 07/09/18 0410  WBC 9.0 9.5  HGB 10.1* 10.1*  HCT 32.2* 32.5*  PLT 264 234   BMET Recent Labs    07/08/18 0504 07/09/18 0410  NA 149* 141  K 3.9 3.1*  CL 115* 106  CO2 24 25  GLUCOSE 145* 123*  BUN 22 17  CREATININE 0.83 0.79  CALCIUM 9.2 8.9   PT/INR Recent Labs    07/07/18 0246  LABPROT 13.8  INR 1.07   ABG No results for input(s): PHART, HCO3 in the last 72 hours.  Invalid input(s): PCO2, PO2  Studies/Results: Dg Abd 1 View  Result Date: 07/07/2018 CLINICAL DATA:  Hernia repair Wednesday. RIGHT lower quadrant pain today. EXAM: ABDOMEN - 1 VIEW COMPARISON:  None. FINDINGS: Enteric tube appears well positioned with tip at the level of the gastric pylorus or duodenal bulb. Bowel gas pattern appears non obstructive. No evidence of abnormal fluid collection. No evidence of free intraperitoneal air. IMPRESSION: 1. Enteric tube well positioned with tip at the level of the gastric pylorus or duodenal bulb. 2. Nonobstructive bowel gas pattern. Electronically Signed   By: Franki Cabot M.D.   On: 07/07/2018 15:02    Anti-infectives: Anti-infectives (From  admission, onward)   Start     Dose/Rate Route Frequency Ordered Stop   07/08/18 1400  metroNIDAZOLE (FLAGYL) tablet 500 mg     500 mg Oral Every 8 hours 07/08/18 1341     07/06/18 0815  cefTRIAXone (ROCEPHIN) 1 g in sodium chloride 0.9 % 100 mL IVPB  Status:  Discontinued     1 g 200 mL/hr over 30 Minutes Intravenous Every 24 hours 07/05/18 0815 07/05/18 1836   07/05/18 2000  cefTRIAXone (ROCEPHIN) 1 g in sodium chloride 0.9 % 100 mL IVPB     1 g 200 mL/hr over 30 Minutes Intravenous Every 24 hours 07/05/18 1941     07/05/18 2000  metroNIDAZOLE (FLAGYL) IVPB 500 mg  Status:  Discontinued     500 mg 100 mL/hr over 60 Minutes Intravenous Every 8 hours 07/05/18 1941 07/08/18 1341   07/05/18 0815  cefTRIAXone (ROCEPHIN) 2 g in sodium chloride 0.9 % 100 mL IVPB  Status:  Discontinued     2 g 200 mL/hr over 30 Minutes Intravenous Every 24 hours 07/05/18 0815 07/05/18 0815   07/03/18 1130  clindamycin (CLEOCIN) IVPB 900 mg     900 mg 100 mL/hr over 30 Minutes Intravenous On call to O.R. 07/03/18 1125 07/03/18 1310      Assessment/Plan: s/p Procedure(s): LAPAROSCOPIC INCISIONAL HERNIA REPAIR WITH MESH (N/A) AKI- resolved labs improved  Foley out  Ileus  Resolving   Advance diet  FEN- full liquid diet  Cont PT  Pt has no interest in rehab  Goal is discharge home   Cont IVF until po intake better   LOS: 5 days    Elizabeth Mcclain 07/09/2018

## 2018-07-09 NOTE — Progress Notes (Signed)
Inpatient Rehabilitation-Admissions Coordinator   Consult received. AC met with pt and a family member at the bedside to discuss CIR program. Both want to pursue [facility] therapy instead, as they feel she will be able to manage at home and that is her preference. AC has communicated with CM regarding venue preference. AC will sign off.   Please call if questions.   Jhonnie Garner, OTR/L  Rehab Admissions Coordinator  [phone] 07/09/2018 9:08 AM

## 2018-07-09 NOTE — Care Management Note (Signed)
Case Management Note  Patient Details  Name: Elizabeth Mcclain MRN: [id number] Date of Birth: March 13, 1946  Subjective/Objective:                    Action/Plan:  Spoke to patient and her spouse at bedside. Discussed PT recommendation of CIR. Both state they prefer to go directly home from hospital with home health. Patient had knee surgery 6 months ago and had [facility], and is requesting Aaron Edelman from [place]. Referral given to [facility] with  [facility] along with request of Aaron Edelman.   Provided Medicare.gov home health list, and left at bedside.  Patient has walker and 3 in 1 at home. Husband will transport patient home at discharge. Expected Discharge Date:  07/03/18               Expected Discharge Plan:  [place]  In-House Referral:     Discharge planning Services  CM Consult  Post Acute Care Choice:  Home Health Choice offered to:  Patient, Spouse  DME Arranged:  N/A DME Agency:  NA  HH Arranged:  RN, PT, OT, Nurse's Aide [place] Agency:  [place]  Status of Service:  In process, will continue to follow  If discussed at Long Length of Stay Meetings, dates discussed:    Additional Comments:  Marilu Favre, RN 07/09/2018, 9:39 AM

## 2018-07-09 NOTE — Progress Notes (Signed)
Nutrition Follow-up  DOCUMENTATION CODES:   Obesity unspecified  INTERVENTION:  Ensure Enlive po BID, each supplement provides 350 kcal and 20 grams of protein (no flavor preference)  NUTRITION DIAGNOSIS:   Increased nutrient needs related to post-op healing as evidenced by other (comment), estimated needs(FL diet order).   GOAL:   Patient will meet greater than or equal to 90% of their needs   MONITOR:   Supplement acceptance, Diet advancement, PO intake, Weight trends, I & O's  REASON FOR ASSESSMENT:   Ventilator    ASSESSMENT:   73 year old female with history of HTN, hypothyroidism, and prior umbilical repair 0/9983 who developed mild discomfort and increasing bulging at site.  Admitted and underwent laparoscopic hernia repair with mesh 3/82 complicated by two episodes of hypotension. Labs 2/13 noted for AKI, nephrology was consulted. KUB 2/13 showed acute ileus. Overnight 2/13-2/14 patient developed nausea with several episodes of vomiting followed by respiratory distress and hypoxia requiring NRB. An NGT was placed with ~1.5 L in output since. Concern for aspiration and patient was transferred to ICU and subsequently intubated.   Patient sitting in chair with husband in room at time of visit. Patient reports tolerating FL diet, denies n/v/d and stated that she is not impressed with her limited food choices. RD offered Ensure and pt agreed to any flavor.   Husband reports patient having a BM this morning; describes type 6  2/15- extubated 2/17- SLP eval; regular; thin liquids  Medications reviewed and include: MVI, potassium chloride tablet , B12, vitD  D5 @50  mL/hr providing 204 kcals Labs: K 3.1 (L) replacing    NUTRITION - FOCUSED PHYSICAL EXAM:    Most Recent Value  Orbital Region  No depletion  Upper Arm Region  No depletion  Thoracic and Lumbar Region  Unable to assess  Buccal Region  No depletion  Temple Region  No depletion  Clavicle Bone Region  No  depletion  Clavicle and Acromion Bone Region  No depletion  Scapular Bone Region  Unable to assess  Dorsal Hand  Unable to assess [bilateral mitten restraints]  Patellar Region  No depletion  Anterior Thigh Region  No depletion  Posterior Calf Region  No depletion  Edema (RD Assessment)  Mild [BLE]  Hair  Reviewed  Eyes  Reviewed  Mouth  Unable to assess  Skin  Reviewed  Nails  Unable to assess       Diet Order:   Diet Order            Diet full liquid Room service appropriate? Yes; Fluid consistency: Thin  Diet effective now        Diet - low sodium heart healthy              EDUCATION NEEDS:   No education needs have been identified at this time  Skin:  Skin Assessment: Reviewed RN Assessment(incision (closed) abdominal) Skin Integrity Issues:: Incisions Incisions: abdominal (2/12)  Last BM:  2/18  Height:   Ht Readings from Last 1 Encounters:  07/03/18 4\' 11"  (1.499 m)    Weight:   Wt Readings from Last 1 Encounters:  07/07/18 88.6 kg    Ideal Body Weight:  42.9 kg  BMI:  Body mass index is 39.45 kg/m.  Estimated Nutritional Needs:   Kcal:  1575-1700  Protein:  85- 98 grams  Fluid:  >/= 1.7 L/day    Lajuan Lines, RD, LDN  After Hours/Weekend Pager: [phone]

## 2018-07-09 NOTE — Evaluation (Signed)
Occupational Therapy Evaluation Patient Details Name: RAMONICA GRIGG MRN: [id number] DOB: Nov 17, 1945 Today's Date: 07/09/2018    History of Present Illness  73 y.o. female who was in ICU for 5 days, s/p incisional hernia repair. PMH of acute respiratory failure.   Clinical Impression   Patient evaluated by Occupational Therapy with no further acute OT needs identified. All education has been completed and the patient has no further questions. See below for any follow-up Occupational Therapy or equipment needs. OT to sign off. Thank you for referral.      Follow Up Recommendations  No OT follow up    Equipment Recommendations  None recommended by OT    Recommendations for Other Services       Precautions / Restrictions Precautions Precautions: None Restrictions Weight Bearing Restrictions: No      Mobility Bed Mobility               General bed mobility comments: in chair on arrival  Transfers                 General transfer comment: MOD I with spouse. pt has just finished walking with spouse in hall    Balance                                           ADL either performed or assessed with clinical judgement   ADL Overall ADL's : Modified independent(with family (A))                                       General ADL Comments: pt able to cross L LE but less cross with r LE for figure 4 cross. pt educated on spouse (A) and positioning     Vision         Perception     Praxis      Pertinent Vitals/Pain Pain Assessment: No/denies pain     Hand Dominance Right   Extremity/Trunk Assessment Upper Extremity Assessment Upper Extremity Assessment: Generalized weakness       Cervical / Trunk Assessment Cervical / Trunk Assessment: Kyphotic   Communication Communication Communication: No difficulties   Cognition Arousal/Alertness: Awake/alert Behavior During Therapy: WFL for tasks  assessed/performed Overall Cognitive Status: Within Functional Limits for tasks assessed                                 General Comments: pt does confirm information at times with family   General Comments  educated on bathing around incision    Exercises     Shoulder Instructions      Home Living Family/patient expects to be discharged to:: Private residence Living Arrangements: Spouse/significant other Available Help at Discharge: Family;Available PRN/intermittently Type of Home: House Home Access: Ramped entrance     Home Layout: One level     Bathroom Shower/Tub: Teacher, early years/pre: Standard     Home Equipment: Bedside commode;Walker - 2 wheels   Additional Comments: has a cat at home. spouse to give 24/7 (A)      Prior Functioning/Environment Level of Independence: Independent        Comments: Pt was able to ambulate with no AD, has RW and BSC from previous knee surgery. She  reports she was walking mostly household distances, but husband reports she was able to walk community distance.        OT Problem List:        OT Treatment/Interventions:      OT Goals(Current goals can be found in the care plan section) Acute Rehab OT Goals Patient Stated Goal: go home  OT Frequency:     Barriers to D/C:            Co-evaluation              AM-PAC OT "6 Clicks" Daily Activity     Outcome Measure Help from another person eating meals?: None Help from another person taking care of personal grooming?: None Help from another person toileting, which includes using toliet, bedpan, or urinal?: None Help from another person bathing (including washing, rinsing, drying)?: A Little Help from another person to put on and taking off regular upper body clothing?: None Help from another person to put on and taking off regular lower body clothing?: A Little 6 Click Score: 22   End of Session Nurse Communication: Mobility  status;Precautions  Activity Tolerance: Patient tolerated treatment well Patient left: in chair;with call bell/phone within reach;with family/visitor present  OT Visit Diagnosis: Unsteadiness on feet (R26.81)                Time: 1542-1600 OT Time Calculation (min): 18 min Charges:  OT General Charges $OT Visit: 1 Visit OT Evaluation $OT Eval Moderate Complexity: 1 Mod   Jeri Modena, OTR/L  Acute Rehabilitation Services Pager: [phone] Office: [phone] .   Jeri Modena 07/09/2018, 4:53 PM

## 2018-07-10 ENCOUNTER — Inpatient Hospital Stay (HOSPITAL_COMMUNITY): Payer: Medicare Other

## 2018-07-10 LAB — BASIC METABOLIC PANEL
Anion gap: 11 (ref 5–15)
BUN: 15 mg/dL (ref 8–23)
CO2: 25 mmol/L (ref 22–32)
Calcium: 9.1 mg/dL (ref 8.9–10.3)
Chloride: 103 mmol/L (ref 98–111)
Creatinine, Ser: 0.75 mg/dL (ref 0.44–1.00)
GFR calc Af Amer: >60 mL/min (ref >60)
GFR calc non Af Amer: >60 mL/min (ref >60)
Glucose, Bld: 108 mg/dL — ABNORMAL HIGH (ref 70–99)
Potassium: 3.9 mmol/L (ref 3.5–5.1)
Sodium: 139 mmol/L (ref 135–145)

## 2018-07-10 LAB — URINALYSIS, ROUTINE W REFLEX MICROSCOPIC
Bacteria, UA: NONE SEEN
Bilirubin Urine: NEGATIVE
Glucose, UA: NEGATIVE mg/dL
Hgb urine dipstick: NEGATIVE
Ketones, ur: NEGATIVE mg/dL
Nitrite: NEGATIVE
Protein, ur: NEGATIVE mg/dL
Specific Gravity, Urine: 1.014 (ref 1.005–1.030)
pH: 7 (ref 5.0–8.0)

## 2018-07-10 LAB — GLUCOSE, CAPILLARY
Glucose-Capillary: 100 mg/dL — ABNORMAL HIGH (ref 70–99)
Glucose-Capillary: 109 mg/dL — ABNORMAL HIGH (ref 70–99)
Glucose-Capillary: 114 mg/dL — ABNORMAL HIGH (ref 70–99)
Glucose-Capillary: 117 mg/dL — ABNORMAL HIGH (ref 70–99)
Glucose-Capillary: 122 mg/dL — ABNORMAL HIGH (ref 70–99)
Glucose-Capillary: 90 mg/dL (ref 70–99)

## 2018-07-10 LAB — CBC
HCT: 31.9 % — ABNORMAL LOW (ref 36.0–46.0)
HEMOGLOBIN: 10.5 g/dL — AB (ref 12.0–15.0)
MCH: 29.3 pg (ref 26.0–34.0)
MCHC: 32.9 g/dL (ref 30.0–36.0)
MCV: 89.1 fL (ref 80.0–100.0)
Platelets: 229 10*3/uL (ref 150–400)
RBC: 3.58 MIL/uL — ABNORMAL LOW (ref 3.87–5.11)
RDW: 15.3 % (ref 11.5–15.5)
WBC: 12.2 10*3/uL — ABNORMAL HIGH (ref 4.0–10.5)
nRBC: 0.4 % — ABNORMAL HIGH (ref 0.0–0.2)

## 2018-07-10 LAB — CULTURE, BLOOD (ROUTINE X 2)
CULTURE: NO GROWTH
Culture: NO GROWTH
Special Requests: ADEQUATE

## 2018-07-10 LAB — MAGNESIUM: MAGNESIUM: 1.8 mg/dL (ref 1.7–2.4)

## 2018-07-10 LAB — PHOSPHORUS: Phosphorus: 3.2 mg/dL (ref 2.5–4.6)

## 2018-07-10 NOTE — Progress Notes (Signed)
PT Cancellation Note  Patient Details Name: Elizabeth Mcclain MRN: [id number] DOB: 12/15/1945   Cancelled Treatment:    Reason Eval/Treat Not Completed: Patient declined, no reason specified; attempted twice to see pt this am.  Eating breakfast, then reports she prefers to wait until this evening to walk with her spouse.  Discussed issues with home entry and she has a ramp and bed mobility and reports spouse can assist and will have HHPT at d/c.  Encouraged to walk more than once if able, but she prefers to do it with her spouse.  Will report to RN to ensure she follows through.  Pt hopeful for home tomorrow.    Reginia Naas 07/10/2018, 11:50 AM  Magda Kiel, [place] [phone] 07/10/2018

## 2018-07-10 NOTE — Progress Notes (Signed)
PROGRESS NOTE    Elizabeth Mcclain  RXV:[id number] DOB: Oct 11, 1945 DOA: 07/03/2018 PCP: [facility]   Brief Narrative:  HPI on 06/12/2018 by Dr. Erroll Luna  Patient returns for follow-up after umbilical hernia repair 1 year ago. She had a bulge noted this summer. CT scan was done which showed some inflammation but no evidence of hernia recurrence. Her symptoms are about the same but she wants to bulge recheck. She has some mild discomfort but no severe pain at that site. She thinks it might be getting larger.  Interim history Was admitted for hernia repair however hospitalization was complicated by hypotension.  Patient was also found to have acute kidney injury, nephrology was consulted.  She developed acute ileus.  Seems the patient also had respiratory distress with hypoxemia and required nonrebreather.  She also had an NG tube that was placed with 1.5 L output.  PCCM was consulted, patient was intubated and extubated.  Currently respiratory status has improved.  Diet is being advanced by general surgery today. Assessment & Plan   Acute respiratory failure secondary to aspiration pneumonia -Patient required intubation on 07/05/2018 was extubated successfully on 07/06/2018 -Currently on room air and maintaining oxygen saturations in the 90s. -Trach aspirate showed few Klebsiella, sensitive to ceftriaxone -Speech therapy consulted and recommended regular diet -Patient has been advanced to soft diet today by general surgery  Ileus/laparoscopic hernia repair -With mesh by general surgery -Patient required NG tube which has been removed. -As above diet being advanced to soft  Acute kidney injury, hypokalemia -Likely secondary to hypotension -Resolved  Hypothyroidism -Continue Synthroid  Hypomagnesemia/Hyponatremia/Hypophosphatemia -resolved  Acute metabolic encephalopathy/delirium -Improved, patient currently alert and oriented x3  Essential  hypertension -Continue amlodipine  Depression/anxiety -Continue Paxil   DVT Prophylaxis Lovenox  Code Status: Full  Family Communication: None at bedside  Disposition Plan: Admitted.  Pending surgical recommendations  Consultants General surgery PCCM  Procedures  Intubation/extubation Hernia repair  Antibiotics   Anti-infectives (From admission, onward)   Start     Dose/Rate Route Frequency Ordered Stop   07/08/18 1400  metroNIDAZOLE (FLAGYL) tablet 500 mg     500 mg Oral Every 8 hours 07/08/18 1341     07/06/18 0815  cefTRIAXone (ROCEPHIN) 1 g in sodium chloride 0.9 % 100 mL IVPB  Status:  Discontinued     1 g 200 mL/hr over 30 Minutes Intravenous Every 24 hours 07/05/18 0815 07/05/18 1836   07/05/18 2000  cefTRIAXone (ROCEPHIN) 1 g in sodium chloride 0.9 % 100 mL IVPB     1 g 200 mL/hr over 30 Minutes Intravenous Every 24 hours 07/05/18 1941     07/05/18 2000  metroNIDAZOLE (FLAGYL) IVPB 500 mg  Status:  Discontinued     500 mg 100 mL/hr over 60 Minutes Intravenous Every 8 hours 07/05/18 1941 07/08/18 1341   07/05/18 0815  cefTRIAXone (ROCEPHIN) 2 g in sodium chloride 0.9 % 100 mL IVPB  Status:  Discontinued     2 g 200 mL/hr over 30 Minutes Intravenous Every 24 hours 07/05/18 0815 07/05/18 0815   07/03/18 1130  clindamycin (CLEOCIN) IVPB 900 mg     900 mg 100 mL/hr over 30 Minutes Intravenous On call to O.R. 07/03/18 1125 07/03/18 1310      Subjective:   Janece Canterbury seen and examined today.  No complaints this morning.  Denies current chest pain, shortness breath, abdominal pain, nausea vomiting, diarrhea constipation, dizziness or headache.  Objective:   Vitals:   07/09/18 1439  07/09/18 2104 07/10/18 0421 07/10/18 1334  BP: 138/65 (!) 151/68 (!) 149/69 (!) 158/63  Pulse: 78 72 73 76  Resp:  17 20   Temp: 98.4 F (36.9 C) 98.5 F (36.9 C) 99.1 F (37.3 C) 98.7 F (37.1 C)  TempSrc: Oral Oral Oral Oral  SpO2: 97% 98% 95% 96%  Weight:      Height:         Intake/Output Summary (Last 24 hours) at 07/10/2018 1502 Last data filed at 07/10/2018 0400 Gross per 24 hour  Intake 1046.36 ml  Output 300 ml  Net 746.36 ml   Filed Weights   07/03/18 1133 07/07/18 0600  Weight: 76.5 kg 88.6 kg    Exam  General: Well developed, well nourished, NAD, appears stated age  73: NCAT, mucous membranes moist.   Neck: Supple  Cardiovascular: S1 S2 auscultated, no murmur, RRR  Respiratory: Clear to auscultation bilaterally with equal chest rise  Abdomen: Soft, nontender, nondistended, + bowel sounds  Extremities: warm dry without cyanosis clubbing or edema  Neuro: AAOx3, nonfocal  Psych: Normal affect and demeanor with intact judgement and insight   Data Reviewed: I have personally reviewed following labs and imaging studies  CBC: Recent Labs  Lab 07/06/18 0422 07/07/18 0246 07/08/18 0504 07/09/18 0410 07/10/18 0502  WBC 9.2 8.4 9.0 9.5 12.2*  NEUTROABS  --  6.7  --   --   --   HGB 10.0* 10.4* 10.1* 10.1* 10.5*  HCT 30.6* 31.1* 32.2* 32.5* 31.9*  MCV 88.7 87.6 89.9 91.3 89.1  PLT 236 232 264 234 604   Basic Metabolic Panel: Recent Labs  Lab 07/05/18 1120  07/07/18 0246 07/07/18 1943 07/08/18 0504 07/09/18 0410 07/10/18 0502  NA 135   < > 144 148* 149* 141 139  K 2.8*   < > 2.6* 4.0 3.9 3.1* 3.9  CL 109   < > 109 116* 115* 106 103  CO2 18*   < > 27 24 24 25 25   GLUCOSE 140*   < > 106* 135* 145* 123* 108*  BUN 46*   < > 32* 27* 22 17 15   CREATININE 1.69*   < > 1.20* 0.85 0.83 0.79 0.75  CALCIUM 8.5*   < > 9.2 9.4 9.2 8.9 9.1  MG 1.5*  --  1.7  --  1.8 1.4* 1.8  PHOS  --   --  2.2*  --  <1.0* 2.9 3.2   < > = values in this interval not displayed.   GFR: Estimated Creatinine Clearance: 62.5 mL/min (by C-G formula based on SCr of 0.75 mg/dL). Liver Function Tests: Recent Labs  Lab 07/05/18 0233 07/06/18 0422 07/07/18 0246 07/09/18 0410  AST 71* 73* 112* 58*  ALT 56* 70* 82* 69*  ALKPHOS 59 55 68 69   BILITOT 0.8 1.4* 0.7 0.5  PROT 6.9 5.7* 6.4* 5.8*  ALBUMIN 4.0 2.9* 3.0* 2.9*   No results for input(s): LIPASE, AMYLASE in the last 168 hours. No results for input(s): AMMONIA in the last 168 hours. Coagulation Profile: Recent Labs  Lab 07/07/18 0246  INR 1.07   Cardiac Enzymes: No results for input(s): CKTOTAL, CKMB, CKMBINDEX, TROPONINI in the last 168 hours. BNP (last 3 results) No results for input(s): PROBNP in the last 8760 hours. HbA1C: No results for input(s): HGBA1C in the last 72 hours. CBG: Recent Labs  Lab 07/09/18 2103 07/10/18 0013 07/10/18 0423 07/10/18 0809 07/10/18 1212  GLUCAP 112* 117* 100* 90 122*   Lipid Profile:  No results for input(s): CHOL, HDL, LDLCALC, TRIG, CHOLHDL, LDLDIRECT in the last 72 hours. Thyroid Function Tests: No results for input(s): TSH, T4TOTAL, FREET4, T3FREE, THYROIDAB in the last 72 hours. Anemia Panel: No results for input(s): VITAMINB12, FOLATE, FERRITIN, TIBC, IRON, RETICCTPCT in the last 72 hours. Urine analysis:    Component Value Date/Time   COLORURINE YELLOW 07/10/2018 1029   APPEARANCEUR CLEAR 07/10/2018 1029   LABSPEC 1.014 07/10/2018 1029   PHURINE 7.0 07/10/2018 1029   GLUCOSEU NEGATIVE 07/10/2018 1029   HGBUR NEGATIVE 07/10/2018 1029   [place] 07/10/2018 1029   KETONESUR NEGATIVE 07/10/2018 1029   PROTEINUR NEGATIVE 07/10/2018 1029   UROBILINOGEN 0.2 07/24/2007 0911   NITRITE NEGATIVE 07/10/2018 1029   LEUKOCYTESUR MODERATE (A) 07/10/2018 1029   Sepsis Labs: @LABRCNTIP (procalcitonin:4,lacticidven:4)  ) Recent Results (from the past 240 hour(s))  Culture, respiratory (non-expectorated)     Status: None   Collection Time: 07/05/18  2:20 PM  Result Value Ref Range Status   Specimen Description TRACHEAL ASPIRATE  Final   Special Requests NONE  Final   Gram Stain   Final    MODERATE WBC PRESENT, PREDOMINANTLY PMN RARE GRAM POSITIVE RODS RARE GRAM POSITIVE COCCI RARE GRAM NEGATIVE  RODS Performed at [facility], [place] [address] [place], [place] [address]    Culture FEW KLEBSIELLA PNEUMONIAE  Final   Report Status 07/08/2018 FINAL  Final   Organism ID, Bacteria KLEBSIELLA PNEUMONIAE  Final      Susceptibility   Klebsiella pneumoniae - MIC*    AMPICILLIN >=32 RESISTANT Resistant     CEFAZOLIN <=4 SENSITIVE Sensitive     CEFEPIME <=1 SENSITIVE Sensitive     CEFTAZIDIME <=1 SENSITIVE Sensitive     CEFTRIAXONE <=1 SENSITIVE Sensitive     CIPROFLOXACIN <=0.25 SENSITIVE Sensitive     GENTAMICIN <=1 SENSITIVE Sensitive     IMIPENEM <=0.25 SENSITIVE Sensitive     TRIMETH/SULFA <=20 SENSITIVE Sensitive     AMPICILLIN/SULBACTAM 4 SENSITIVE Sensitive     PIP/TAZO <=4 SENSITIVE Sensitive     Extended ESBL NEGATIVE Sensitive     * FEW KLEBSIELLA PNEUMONIAE  Culture, blood (routine x 2)     Status: None   Collection Time: 07/05/18 10:04 PM  Result Value Ref Range Status   Specimen Description BLOOD RIGHT HAND  Final   Special Requests   Final    BOTTLES DRAWN AEROBIC ONLY Blood Culture adequate volume   Culture   Final    NO GROWTH 5 DAYS Performed at [facility], [place] [address] [place], [place] [address]    Report Status 07/10/2018 FINAL  Final  Culture, blood (routine x 2)     Status: None   Collection Time: 07/05/18 10:14 PM  Result Value Ref Range Status   Specimen Description BLOOD LEFT HAND  Final   Special Requests   Final    BOTTLES DRAWN AEROBIC ONLY Blood Culture results may not be optimal due to an inadequate volume of blood received in culture bottles   Culture   Final    NO GROWTH 5 DAYS Performed at [facility], [place] [address] [place], [place] [address]    Report Status 07/10/2018 FINAL  Final      Radiology Studies: Dg Chest 2v Repeat Same Day  Result Date: 07/10/2018 CLINICAL DATA:  Cough and short of breath EXAM: CHEST - 2 VIEW SAME DAY COMPARISON:  07/07/2018 FINDINGS: NG tube removed. Heart size upper normal. Negative  for heart  failure. Improvement in mild bibasilar atelectasis. No effusion. No new area of airspace disease IMPRESSION: Interval improvement in mild bibasilar airspace disease. NG removed. Electronically Signed   By: Franchot Gallo M.D.   On: 07/10/2018 09:29     Scheduled Meds: . amLODipine  10 mg Oral Daily  . enoxaparin (LOVENOX) injection  40 mg Subcutaneous Daily  . feeding supplement (ENSURE ENLIVE)  237 mL Oral BID BM  . levothyroxine  25 mcg Oral Q0600  . mouth rinse  15 mL Mouth Rinse BID  . metroNIDAZOLE  500 mg Oral Q8H  . multivitamin with minerals  1 tablet Oral Daily  . PARoxetine  40 mg Oral Daily  . pneumococcal 23 valent vaccine  0.5 mL Intramuscular Tomorrow-1000  . polyethylene glycol  17 g Oral Daily  . vitamin B-12  1,000 mcg Oral Daily  . Vitamin D (Ergocalciferol)  50,000 Units Oral Q Sun   Continuous Infusions: . sodium chloride Stopped (07/09/18 2238)  . cefTRIAXone (ROCEPHIN)  IV Stopped (07/09/18 2018)  . dextrose 50 mL/hr at 07/09/18 1133     LOS: 6 days   Time Spent in minutes   30 minutes  Akayla Brass D.O. on 07/10/2018 at 3:02 PM  Between 7am to 7pm - Please see pager noted on [email or web address]  After 7pm go to [email or web address]  And look for the night coverage person covering for me after hours  [facility] Office  [phone]

## 2018-07-10 NOTE — Progress Notes (Signed)
Nutrition Follow-up  DOCUMENTATION CODES:   Obesity unspecified  INTERVENTION:   -MVI with minerals daily -Continue Ensure Enlive po BID, each supplement provides 350 kcal and 20 grams of protein  NUTRITION DIAGNOSIS:   Increased nutrient needs related to post-op healing as evidenced by other (comment), estimated needs(FL diet order).  Ongoing  GOAL:   Patient will meet greater than or equal to 90% of their needs  Progressing  MONITOR:   Supplement acceptance, Diet advancement, PO intake, Weight trends, I & O's  REASON FOR ASSESSMENT:   Ventilator    ASSESSMENT:   73 year old female with history of HTN, hypothyroidism, and prior umbilical repair 07/938 who developed mild discomfort and increasing bulging at site.  Admitted and underwent laparoscopic hernia repair with mesh 7/68 complicated by two episodes of hypotension. Labs 2/13 noted for AKI, nephrology was consulted. KUB 2/13 showed acute ileus. Overnight 2/13-2/14 patient developed nausea with several episodes of vomiting followed by respiratory distress and hypoxia requiring NRB. An NGT was placed with ~1.5 L in output since. Concern for aspiration and patient was transferred to ICU and subsequently intubated.  2/15- extubated 2/17- SLP eval; regular; thin liquids  Pt resting quietly at time of visit. Observed meal tray in front of pt; she consumed 100% of breakfast and Ensure supplement. Prior meal completions have been variable; PO: 25-75%. Will continue with supplements secondary to variable intake.   Per MD notes, potential to d/c home tomorrow.   Labs reviewed: CBGS: 90-122.   Diet Order:   Diet Order            DIET SOFT Room service appropriate? Yes; Fluid consistency: Thin  Diet effective now        Diet - low sodium heart healthy              EDUCATION NEEDS:   No education needs have been identified at this time  Skin:  Skin Assessment: Skin Integrity Issues: Skin Integrity Issues::  Incisions Incisions: closed abdomen  Last BM:  07/10/18  Height:   Ht Readings from Last 1 Encounters:  07/03/18 4\' 11"  (1.499 m)    Weight:   Wt Readings from Last 1 Encounters:  07/07/18 88.6 kg    Ideal Body Weight:  42.9 kg  BMI:  Body mass index is 39.45 kg/m.  Estimated Nutritional Needs:   Kcal:  1575-1700  Protein:  85- 98 grams  Fluid:  >/= 1.7 L/day    Zamyra Allensworth A. Jimmye Norman, RD, LDN, CDE Pager: [phone] After hours Pager: [phone]

## 2018-07-10 NOTE — Progress Notes (Signed)
7 Days Post-Op   Subjective/Chief Complaint: Seems better  Wants to go home  Tolerating diet no pain some urinary incontinence and cough    Objective: Vital signs in last 24 hours: Temp:  [98.4 F (36.9 C)-99.1 F (37.3 C)] 99.1 F (37.3 C) (02/19 0421) Pulse Rate:  [72-78] 73 (02/19 0421) Resp:  [17-20] 20 (02/19 0421) BP: (138-151)/(65-69) 149/69 (02/19 0421) SpO2:  [95 %-98 %] 95 % (02/19 0421) Last BM Date: 07/09/18  Intake/Output from previous day: 02/18 0701 - 02/19 0700 In: 1046.4 [I.V.:846.4; IV Piggyback:200] Out: 300 [Urine:300] Intake/Output this shift: No intake/output data recorded.  General appearance: alert and cooperative Resp: mild bilateral rhonchhi Cardio: regular rate and rhythm, S1, S2 normal, no murmur, click, rub or gallop Incision/Wound:CDI port sites ND NT   Lab Results:  Recent Labs    07/09/18 0410 07/10/18 0502  WBC 9.5 12.2*  HGB 10.1* 10.5*  HCT 32.5* 31.9*  PLT 234 229   BMET Recent Labs    07/09/18 0410 07/10/18 0502  NA 141 139  K 3.1* 3.9  CL 106 103  CO2 25 25  GLUCOSE 123* 108*  BUN 17 15  CREATININE 0.79 0.75  CALCIUM 8.9 9.1   PT/INR No results for input(s): LABPROT, INR in the last 72 hours. ABG No results for input(s): PHART, HCO3 in the last 72 hours.  Invalid input(s): PCO2, PO2  Studies/Results: No results found.  Anti-infectives: Anti-infectives (From admission, onward)   Start     Dose/Rate Route Frequency Ordered Stop   07/08/18 1400  metroNIDAZOLE (FLAGYL) tablet 500 mg     500 mg Oral Every 8 hours 07/08/18 1341     07/06/18 0815  cefTRIAXone (ROCEPHIN) 1 g in sodium chloride 0.9 % 100 mL IVPB  Status:  Discontinued     1 g 200 mL/hr over 30 Minutes Intravenous Every 24 hours 07/05/18 0815 07/05/18 1836   07/05/18 2000  cefTRIAXone (ROCEPHIN) 1 g in sodium chloride 0.9 % 100 mL IVPB     1 g 200 mL/hr over 30 Minutes Intravenous Every 24 hours 07/05/18 1941     07/05/18 2000  metroNIDAZOLE  (FLAGYL) IVPB 500 mg  Status:  Discontinued     500 mg 100 mL/hr over 60 Minutes Intravenous Every 8 hours 07/05/18 1941 07/08/18 1341   07/05/18 0815  cefTRIAXone (ROCEPHIN) 2 g in sodium chloride 0.9 % 100 mL IVPB  Status:  Discontinued     2 g 200 mL/hr over 30 Minutes Intravenous Every 24 hours 07/05/18 0815 07/05/18 0815   07/03/18 1130  clindamycin (CLEOCIN) IVPB 900 mg     900 mg 100 mL/hr over 30 Minutes Intravenous On call to O.R. 07/03/18 1125 07/03/18 1310      Assessment/Plan: s/p Procedure(s): LAPAROSCOPIC INCISIONAL HERNIA REPAIR WITH MESH (N/A)   AKI  - resolved     Leukocytosis check urine and CXR pulmonary toilet  FEN  Soft diet    SL IV     Home Thursday if WBC ok    DVT prevention   On lovanox    Cont PT walking on her own at this point   LOS: 6 days    [name] A Imir Brumbach 07/10/2018

## 2018-07-11 LAB — CBC
HCT: 30.9 % — ABNORMAL LOW (ref 36.0–46.0)
Hemoglobin: 9.9 g/dL — ABNORMAL LOW (ref 12.0–15.0)
MCH: 28.6 pg (ref 26.0–34.0)
MCHC: 32 g/dL (ref 30.0–36.0)
MCV: 89.3 fL (ref 80.0–100.0)
Platelets: 237 10*3/uL (ref 150–400)
RBC: 3.46 MIL/uL — ABNORMAL LOW (ref 3.87–5.11)
RDW: 15.4 % (ref 11.5–15.5)
WBC: 11.4 10*3/uL — ABNORMAL HIGH (ref 4.0–10.5)
nRBC: 0.3 % — ABNORMAL HIGH (ref 0.0–0.2)

## 2018-07-11 LAB — GLUCOSE, CAPILLARY
Glucose-Capillary: 109 mg/dL — ABNORMAL HIGH (ref 70–99)
Glucose-Capillary: 120 mg/dL — ABNORMAL HIGH (ref 70–99)
Glucose-Capillary: 98 mg/dL (ref 70–99)

## 2018-07-11 MED ORDER — GABAPENTIN 300 MG PO CAPS: 300.0000 mg | ORAL_CAPSULE | Freq: Three times a day (TID) | ORAL | 0 refills | Status: DC

## 2018-07-11 MED ORDER — ACETAMINOPHEN 325 MG PO TABS: 650.0000 mg | ORAL_TABLET | Freq: Four times a day (QID) | ORAL | 2 refills | Status: AC | PRN

## 2018-07-11 MED ORDER — METHOCARBAMOL 500 MG PO TABS: 500.0000 mg | ORAL_TABLET | Freq: Four times a day (QID) | ORAL | 0 refills | Status: DC | PRN

## 2018-07-11 NOTE — Progress Notes (Signed)
Patient discharged to home with instructions. 

## 2018-07-11 NOTE — Progress Notes (Signed)
PROGRESS NOTE    Elizabeth Mcclain  JKD:[id number] DOB: Jan 04, 1946 DOA: 07/03/2018 PCP: [facility]   Brief Narrative:  HPI on 06/12/2018 by Dr. Erroll Luna  Patient returns for follow-up after umbilical hernia repair 1 year ago. She had a bulge noted this summer. CT scan was done which showed some inflammation but no evidence of hernia recurrence. Her symptoms are about the same but she wants to bulge recheck. She has some mild discomfort but no severe pain at that site. She thinks it might be getting larger.  Interim history Was admitted for hernia repair however hospitalization was complicated by hypotension.  Patient was also found to have acute kidney injury, nephrology was consulted.  She developed acute ileus.  Seems the patient also had respiratory distress with hypoxemia and required nonrebreather.  She also had an NG tube that was placed with 1.5 L output.  PCCM was consulted, patient was intubated and extubated.  Currently respiratory status has improved.  Diet advanced and patient tolerated.   Assessment & Plan   Acute respiratory failure secondary to aspiration pneumonia -Patient required intubation on 07/05/2018 was extubated successfully on 07/06/2018 -Currently on room air and maintaining oxygen saturations in the 90s. -Trach aspirate showed few Klebsiella, sensitive to ceftriaxone -Speech therapy consulted and recommended regular diet -Diet dvanced to soft diet and tolerated well  Ileus/laparoscopic hernia repair -With mesh by general surgery -Patient required NG tube which has been removed.  Acute kidney injury, hypokalemia -Likely secondary to hypotension -Resolved  Hypothyroidism -Continue Synthroid  Hypomagnesemia/Hyponatremia/Hypophosphatemia -resolved  Acute metabolic encephalopathy/delirium -Improved, patient currently alert and oriented x3  Essential hypertension -Continue amlodipine  Depression/anxiety -Continue Paxil  DVT  Prophylaxis Lovenox  Code Status: Full  Family Communication: None at bedside  Disposition Plan: Admitted.  Stable for discharge today  Consultants General surgery PCCM  Procedures  Intubation/extubation Hernia repair  Antibiotics   Anti-infectives (From admission, onward)   Start     Dose/Rate Route Frequency Ordered Stop   07/08/18 1400  metroNIDAZOLE (FLAGYL) tablet 500 mg     500 mg Oral Every 8 hours 07/08/18 1341     07/06/18 0815  cefTRIAXone (ROCEPHIN) 1 g in sodium chloride 0.9 % 100 mL IVPB  Status:  Discontinued     1 g 200 mL/hr over 30 Minutes Intravenous Every 24 hours 07/05/18 0815 07/05/18 1836   07/05/18 2000  cefTRIAXone (ROCEPHIN) 1 g in sodium chloride 0.9 % 100 mL IVPB     1 g 200 mL/hr over 30 Minutes Intravenous Every 24 hours 07/05/18 1941     07/05/18 2000  metroNIDAZOLE (FLAGYL) IVPB 500 mg  Status:  Discontinued     500 mg 100 mL/hr over 60 Minutes Intravenous Every 8 hours 07/05/18 1941 07/08/18 1341   07/05/18 0815  cefTRIAXone (ROCEPHIN) 2 g in sodium chloride 0.9 % 100 mL IVPB  Status:  Discontinued     2 g 200 mL/hr over 30 Minutes Intravenous Every 24 hours 07/05/18 0815 07/05/18 0815   07/03/18 1130  clindamycin (CLEOCIN) IVPB 900 mg     900 mg 100 mL/hr over 30 Minutes Intravenous On call to O.R. 07/03/18 1125 07/03/18 1310      Subjective:   Elizabeth Mcclain seen and examined today.  No complaints this morning and ready to go home.  Denies chest pain, shortness breath, abdominal pain, nausea or vomiting, diarrhea or constipation.  Objective:   Vitals:   07/10/18 0421 07/10/18 1334 07/10/18 2038 07/11/18 0442  BP: Marland Kitchen)  149/69 (!) 158/63 (!) 147/72 (!) 160/95  Pulse: 73 76 81 83  Resp: 20  17 18   Temp: 99.1 F (37.3 C) 98.7 F (37.1 C) 98.9 F (37.2 C) 98.8 F (37.1 C)  TempSrc: Oral Oral Oral Oral  SpO2: 95% 96% 100% 96%  Weight:      Height:        Intake/Output Summary (Last 24 hours) at 07/11/2018 0846 Last data filed at  07/10/2018 1700 Gross per 24 hour  Intake 240 ml  Output -  Net 240 ml   Filed Weights   07/03/18 1133 07/07/18 0600  Weight: 76.5 kg 88.6 kg   Exam  General: Well developed, well nourished, NAD, appears stated age  61: NCAT, mucous membranes moist.   Neck: Supple  Cardiovascular: S1 S2 auscultated, RRR  Respiratory: Clear to auscultation bilaterally with equal chest rise  Abdomen: Soft, nontender, nondistended, + bowel sounds  Extremities: warm dry without cyanosis clubbing or edema  Neuro: AAOx3, nonfocal  Psych: Pleasant, appropriate mood and affect  Data Reviewed: I have personally reviewed following labs and imaging studies  CBC: Recent Labs  Lab 07/07/18 0246 07/08/18 0504 07/09/18 0410 07/10/18 0502 07/11/18 0404  WBC 8.4 9.0 9.5 12.2* 11.4*  NEUTROABS 6.7  --   --   --   --   HGB 10.4* 10.1* 10.1* 10.5* 9.9*  HCT 31.1* 32.2* 32.5* 31.9* 30.9*  MCV 87.6 89.9 91.3 89.1 89.3  PLT 232 264 234 229 361   Basic Metabolic Panel: Recent Labs  Lab 07/05/18 1120  07/07/18 0246 07/07/18 1943 07/08/18 0504 07/09/18 0410 07/10/18 0502  NA 135   < > 144 148* 149* 141 139  K 2.8*   < > 2.6* 4.0 3.9 3.1* 3.9  CL 109   < > 109 116* 115* 106 103  CO2 18*   < > 27 24 24 25 25   GLUCOSE 140*   < > 106* 135* 145* 123* 108*  BUN 46*   < > 32* 27* 22 17 15   CREATININE 1.69*   < > 1.20* 0.85 0.83 0.79 0.75  CALCIUM 8.5*   < > 9.2 9.4 9.2 8.9 9.1  MG 1.5*  --  1.7  --  1.8 1.4* 1.8  PHOS  --   --  2.2*  --  <1.0* 2.9 3.2   < > = values in this interval not displayed.   GFR: Estimated Creatinine Clearance: 62.5 mL/min (by C-G formula based on SCr of 0.75 mg/dL). Liver Function Tests: Recent Labs  Lab 07/05/18 0233 07/06/18 0422 07/07/18 0246 07/09/18 0410  AST 71* 73* 112* 58*  ALT 56* 70* 82* 69*  ALKPHOS 59 55 68 69  BILITOT 0.8 1.4* 0.7 0.5  PROT 6.9 5.7* 6.4* 5.8*  ALBUMIN 4.0 2.9* 3.0* 2.9*   No results for input(s): LIPASE, AMYLASE in the last  168 hours. No results for input(s): AMMONIA in the last 168 hours. Coagulation Profile: Recent Labs  Lab 07/07/18 0246  INR 1.07   Cardiac Enzymes: No results for input(s): CKTOTAL, CKMB, CKMBINDEX, TROPONINI in the last 168 hours. BNP (last 3 results) No results for input(s): PROBNP in the last 8760 hours. HbA1C: No results for input(s): HGBA1C in the last 72 hours. CBG: Recent Labs  Lab 07/10/18 1633 07/10/18 2034 07/10/18 2351 07/11/18 0427 07/11/18 0758  GLUCAP 114* 109* 109* 98 120*   Lipid Profile: No results for input(s): CHOL, HDL, LDLCALC, TRIG, CHOLHDL, LDLDIRECT in the last 72 hours. Thyroid Function  Tests: No results for input(s): TSH, T4TOTAL, FREET4, T3FREE, THYROIDAB in the last 72 hours. Anemia Panel: No results for input(s): VITAMINB12, FOLATE, FERRITIN, TIBC, IRON, RETICCTPCT in the last 72 hours. Urine analysis:    Component Value Date/Time   COLORURINE YELLOW 07/10/2018 1029   APPEARANCEUR CLEAR 07/10/2018 1029   LABSPEC 1.014 07/10/2018 1029   PHURINE 7.0 07/10/2018 1029   GLUCOSEU NEGATIVE 07/10/2018 1029   HGBUR NEGATIVE 07/10/2018 1029   [place] 07/10/2018 1029   KETONESUR NEGATIVE 07/10/2018 1029   PROTEINUR NEGATIVE 07/10/2018 1029   UROBILINOGEN 0.2 07/24/2007 0911   NITRITE NEGATIVE 07/10/2018 1029   LEUKOCYTESUR MODERATE (A) 07/10/2018 1029   Sepsis Labs: @LABRCNTIP (procalcitonin:4,lacticidven:4)  ) Recent Results (from the past 240 hour(s))  Culture, respiratory (non-expectorated)     Status: None   Collection Time: 07/05/18  2:20 PM  Result Value Ref Range Status   Specimen Description TRACHEAL ASPIRATE  Final   Special Requests NONE  Final   Gram Stain   Final    MODERATE WBC PRESENT, PREDOMINANTLY PMN RARE GRAM POSITIVE RODS RARE GRAM POSITIVE COCCI RARE GRAM NEGATIVE RODS Performed at [facility], [place] [address], [place], [place] [address]    Culture FEW KLEBSIELLA PNEUMONIAE  Final   Report Status  07/08/2018 FINAL  Final   Organism ID, Bacteria KLEBSIELLA PNEUMONIAE  Final      Susceptibility   Klebsiella pneumoniae - MIC*    AMPICILLIN >=32 RESISTANT Resistant     CEFAZOLIN <=4 SENSITIVE Sensitive     CEFEPIME <=1 SENSITIVE Sensitive     CEFTAZIDIME <=1 SENSITIVE Sensitive     CEFTRIAXONE <=1 SENSITIVE Sensitive     CIPROFLOXACIN <=0.25 SENSITIVE Sensitive     GENTAMICIN <=1 SENSITIVE Sensitive     IMIPENEM <=0.25 SENSITIVE Sensitive     TRIMETH/SULFA <=20 SENSITIVE Sensitive     AMPICILLIN/SULBACTAM 4 SENSITIVE Sensitive     PIP/TAZO <=4 SENSITIVE Sensitive     Extended ESBL NEGATIVE Sensitive     * FEW KLEBSIELLA PNEUMONIAE  Culture, blood (routine x 2)     Status: None   Collection Time: 07/05/18 10:04 PM  Result Value Ref Range Status   Specimen Description BLOOD RIGHT HAND  Final   Special Requests   Final    BOTTLES DRAWN AEROBIC ONLY Blood Culture adequate volume   Culture   Final    NO GROWTH 5 DAYS Performed at [facility], [place] [address], [place], [place] [address]    Report Status 07/10/2018 FINAL  Final  Culture, blood (routine x 2)     Status: None   Collection Time: 07/05/18 10:14 PM  Result Value Ref Range Status   Specimen Description BLOOD LEFT HAND  Final   Special Requests   Final    BOTTLES DRAWN AEROBIC ONLY Blood Culture results may not be optimal due to an inadequate volume of blood received in culture bottles   Culture   Final    NO GROWTH 5 DAYS Performed at [facility], [place] [address] [place], [place] [address]    Report Status 07/10/2018 FINAL  Final      Radiology Studies: Dg Chest 2v Repeat Same Day  Result Date: 07/10/2018 CLINICAL DATA:  Cough and short of breath EXAM: CHEST - 2 VIEW SAME DAY COMPARISON:  07/07/2018 FINDINGS: NG tube removed. Heart size upper normal. Negative for heart failure. Improvement in mild bibasilar atelectasis. No effusion. No new area of airspace disease IMPRESSION: Interval improvement in  mild bibasilar airspace disease. NG removed. Electronically Signed   By: Franchot Gallo M.D.   On: 07/10/2018 09:29     Scheduled Meds: . amLODipine  10 mg Oral Daily  . enoxaparin (LOVENOX) injection  40 mg Subcutaneous Daily  . feeding supplement (ENSURE ENLIVE)  237 mL Oral BID BM  . levothyroxine  25 mcg Oral Q0600  . mouth rinse  15 mL Mouth Rinse BID  . metroNIDAZOLE  500 mg Oral Q8H  . multivitamin with minerals  1 tablet Oral Daily  . PARoxetine  40 mg Oral Daily  . pneumococcal 23 valent vaccine  0.5 mL Intramuscular Tomorrow-1000  . polyethylene glycol  17 g Oral Daily  . vitamin B-12  1,000 mcg Oral Daily  . Vitamin D (Ergocalciferol)  50,000 Units Oral Q Sun   Continuous Infusions: . sodium chloride Stopped (07/09/18 2238)  . cefTRIAXone (ROCEPHIN)  IV 1 g (07/10/18 2045)     LOS: 7 days   Time Spent in minutes   30 minutes  Abdullah Rizzi D.O. on 07/11/2018 at 8:46 AM  Between 7am to 7pm - Please see pager noted on [email or web address]  After 7pm go to [email or web address]  And look for the night coverage person covering for me after hours  [facility] Office  [phone]

## 2018-07-11 NOTE — Consult Note (Signed)
            [facility] Primary Care Navigator  07/11/2018  Elizabeth Mcclain September 12, 1945 [id number]   Went to see patient in the room to identify possible discharge needs but she was alreadydischargedper RN report.  Patient went home with home health services.  Per MD note, patient was admitted for hernia repair however, hospitalization was complicated by hypotension and severe ileus secondary to acute kidney injury, developed respiratory distress and had to be emergently intubated due to concern for aspiration.She was extubated and transferred to the unit.  Patient hasdischarge instruction to follow-up withgeneral surgery in 2 weeks.  Primary care provider's officeis listed asprovidingtransition of care (TOC) follow-up.   For additional questions please contact:  Edwena Felty A. Vida Nicol, BSN, RN-BC [facility] PRIMARY CARE Navigator Cell: [phone]

## 2018-07-11 NOTE — Progress Notes (Signed)
  Speech Language Pathology Treatment:    Patient Details Name: Elizabeth Mcclain MRN: [id number] DOB: [birth year] Today's Date: 07/11/2018 Time: 1048-1100 SLP Time Calculation (min) (ACUTE ONLY): 12 min  Assessment / Plan / Recommendation Clinical Impression  Pt was seen for dysphagia treatment to assess tolerance of the upgraded diet. She tolerated soft solids, regular texture solids, dual consistency boluses (fruit with thin liquids) and thin liquids via cup without symptoms of oropharyngeal dysphagia. Pt and nursing denied any difficulty with meals/pills and her discharge orders have been signed. Further skilled SLP services will not be clinically indicated following discharge.    HPI HPI: 73 year old female admitted 07/04/2018 for laparoscopic repair of incisional hernia. Pt developed N/V post-op, with respiratory distress and hypoxia, and "gurgling with poor cough concerning for ongoing aspiration event" PMH: umbilical hernia, HTN, hypothyroid. Pt was intubated 2/14-15/2020.      SLP Plan  Continue with current plan of care       Recommendations  Liquids provided via: Straw;Cup Medication Administration: Whole meds with liquid Supervision: Patient able to self feed Compensations: Small sips/bites Postural Changes and/or Swallow Maneuvers: Seated upright 90 degrees                Oral Care Recommendations: Oral care BID Follow up Recommendations: None SLP Visit Diagnosis: Dysphagia, unspecified (R13.10) Plan: Continue with current plan of care       Eisley Barber I. Hardin Negus, [place], [place] Office number [phone] Pager [place] 07/11/2018, 11:05 AM

## 2018-07-11 NOTE — Progress Notes (Signed)
8 Days Post-Op   Subjective/Chief Complaint:   Patient doing well.  She is tolerating her diet, moving her bowels, ambulating with a walker which she does at home and is not short of breath.  Her saturations are good.  Her urine output is stable.  Her repeat white count today is less and her chest x-ray and urinalysis are unremarkable. Objective: Vital signs in last 24 hours: Temp:  [98.7 F (37.1 C)-98.9 F (37.2 C)] 98.8 F (37.1 C) (02/20 0442) Pulse Rate:  [76-83] 83 (02/20 0442) Resp:  [17-18] 18 (02/20 0442) BP: (147-160)/(63-95) 160/95 (02/20 0442) SpO2:  [96 %-100 %] 96 % (02/20 0442) Last BM Date: 07/10/18  Intake/Output from previous day: 02/19 0701 - 02/20 0700 In: 480 [P.O.:480] Out: -  Intake/Output this shift: No intake/output data recorded.  General appearance: alert and cooperative Chest wall: no tenderness Cardio: regular rate and rhythm, S1, S2 normal, no murmur, click, rub or gallop GI: Port sites clean dry intact.  Abdomen soft nontender without rebound or guarding.  Lab Results:  Recent Labs    07/10/18 0502 07/11/18 0404  WBC 12.2* 11.4*  HGB 10.5* 9.9*  HCT 31.9* 30.9*  PLT 229 237   BMET Recent Labs    07/09/18 0410 07/10/18 0502  NA 141 139  K 3.1* 3.9  CL 106 103  CO2 25 25  GLUCOSE 123* 108*  BUN 17 15  CREATININE 0.79 0.75  CALCIUM 8.9 9.1   PT/INR No results for input(s): LABPROT, INR in the last 72 hours. ABG No results for input(s): PHART, HCO3 in the last 72 hours.  Invalid input(s): PCO2, PO2  Studies/Results: Dg Chest 2v Repeat Same Day  Result Date: 07/10/2018 CLINICAL DATA:  Cough and short of breath EXAM: CHEST - 2 VIEW SAME DAY COMPARISON:  07/07/2018 FINDINGS: NG tube removed. Heart size upper normal. Negative for heart failure. Improvement in mild bibasilar atelectasis. No effusion. No new area of airspace disease IMPRESSION: Interval improvement in mild bibasilar airspace disease. NG removed. Electronically  Signed   By: Franchot Gallo M.D.   On: 07/10/2018 09:29    Anti-infectives: Anti-infectives (From admission, onward)   Start     Dose/Rate Route Frequency Ordered Stop   07/08/18 1400  metroNIDAZOLE (FLAGYL) tablet 500 mg     500 mg Oral Every 8 hours 07/08/18 1341     07/06/18 0815  cefTRIAXone (ROCEPHIN) 1 g in sodium chloride 0.9 % 100 mL IVPB  Status:  Discontinued     1 g 200 mL/hr over 30 Minutes Intravenous Every 24 hours 07/05/18 0815 07/05/18 1836   07/05/18 2000  cefTRIAXone (ROCEPHIN) 1 g in sodium chloride 0.9 % 100 mL IVPB     1 g 200 mL/hr over 30 Minutes Intravenous Every 24 hours 07/05/18 1941     07/05/18 2000  metroNIDAZOLE (FLAGYL) IVPB 500 mg  Status:  Discontinued     500 mg 100 mL/hr over 60 Minutes Intravenous Every 8 hours 07/05/18 1941 07/08/18 1341   07/05/18 0815  cefTRIAXone (ROCEPHIN) 2 g in sodium chloride 0.9 % 100 mL IVPB  Status:  Discontinued     2 g 200 mL/hr over 30 Minutes Intravenous Every 24 hours 07/05/18 0815 07/05/18 0815   07/03/18 1130  clindamycin (CLEOCIN) IVPB 900 mg     900 mg 100 mL/hr over 30 Minutes Intravenous On call to O.R. 07/03/18 1125 07/03/18 1310      Assessment/Plan: s/p Procedure(s): LAPAROSCOPIC INCISIONAL HERNIA REPAIR WITH MESH (N/A) Discharge  Discharge home today.  Hold loop diuretics for now instruct patient to follow-up with PCP next week.  Continue soft diet.  Discussed physical therapy with patient and husband.  They are going to see her next week but she is walking well with her walker which she has been doing due to knee pain at home.  Husband is comfortable taking her home.  Only Tylenol for pain recommended.  Patient may shower.  She will follow-up in 2 weeks with me.  LOS: 7 days    Elizabeth Mcclain 07/11/2018

## 2018-07-11 NOTE — Discharge Summary (Signed)
Physician Discharge Summary  Patient ID: Elizabeth Mcclain MRN: [id number] DOB/AGE: 72-Mar-1947 72 y.o.  Admit date: 07/03/2018 Discharge date: 07/11/2018  Admission Diagnoses: Incisional hernia  Discharge Diagnoses:  Active Problems:   Incisional hernia without obstruction or gangrene   Acute respiratory failure with hypoxia (HCC) Acute kidney injury    Discharged Condition: good  Hospital Course: Patient dated 8 days ago for laparoscopic repair of recurrent incisional hernia.  She was admitted postoperatively and on postop day 1 developed a severe ileus secondary to acute kidney injury with a bump in creatinine to 2.76.  She was oliguric.  She developed respiratory distress on postoperative day 2 had to be emergently intubated due to severe ileus and concern for aspiration.  She spent 2 days in the intensive care unit being managed by CCM and followed by nephrology and medicine.  She was sent to the floor on postoperative day #4 after extubation once her acute kidney injury had resolved.  Her urine output improved and her creatinine began to decline into a normal range.  She spent the next 4 days on the floor undergoing physical therapy, Occupational Therapy and decided to go home with home physical therapy in the house and she did not wish to go to inpatient rehab.  Her bowels are moving, she was tolerating her diet, she was afebrile, she had good oxygen saturations on room air, and she was discharged home on postoperative day #8.  She will follow-up in 2 weeks with me.  Physical therapy is been arranged through home health and will start next week.  I have elected to hold her loop diuretics for now and all NSAIDs given her recent issue with acute kidney injury.  I have instructed her to call her primary care doctor for blood pressure management upon discharge but her blood pressure is remained relatively stable in the hospital.  She will shower when she gets home and refrain from any lifting above  20 pounds for the next 4 weeks.  She will resume a regular diet as tolerated and be given Tylenol or oxycodone for pain and try to limit NSAID use for now.  Consults: pulmonary/intensive care, nephrology and General medicine  Significant Diagnostic Studies: labs:  CMP Latest Ref Rng & Units 07/10/2018 07/09/2018 07/08/2018  Glucose 70 - 99 mg/dL 108(H) 123(H) 145(H)  BUN 8 - 23 mg/dL 15 17 22   Creatinine 0.44 - 1.00 mg/dL 0.75 0.79 0.83  Sodium 135 - 145 mmol/L 139 141 149(H)  Potassium 3.5 - 5.1 mmol/L 3.9 3.1(L) 3.9  Chloride 98 - 111 mmol/L 103 106 115(H)  CO2 22 - 32 mmol/L 25 25 24   Calcium 8.9 - 10.3 mg/dL 9.1 8.9 9.2  Total Protein 6.5 - 8.1 g/dL - 5.8(L) -  Total Bilirubin 0.3 - 1.2 mg/dL - 0.5 -  Alkaline Phos 38 - 126 U/L - 69 -  AST 15 - 41 U/L - 58(H) -  ALT 0 - 44 U/L - 69(H) -    Treatments: IV hydration, antibiotics: ceftriaxone and metronidazole, respiratory therapy: O2, albuterol/atropine nebulizer and mechanical ventilation and surgery: Laparoscopic incisional hernia repair with mesh  Discharge Exam: Blood pressure (!) 160/95, pulse 83, temperature 98.8 F (37.1 C), temperature source Oral, resp. rate 18, height 4\' 11"  (1.499 m), weight 88.6 kg, SpO2 96 %. General appearance: alert and cooperative Head: Normocephalic, without obvious abnormality, atraumatic Resp: clear to auscultation bilaterally Cardio: regular rate and rhythm, S1, S2 normal, no murmur, click, rub or gallop Neurologic: Grossly normal  Incision/Wound: Incisions clean dry and intact.  Port sites clean dry and intact.  Soft nontender without distention, rebound or guarding.  Disposition: Discharge disposition: 01-Home or Self Care       Discharge Instructions    Diet - low sodium heart healthy   Complete by:  As directed    Diet - low sodium heart healthy   Complete by:  As directed    Increase activity slowly   Complete by:  As directed    Increase activity slowly   Complete by:  As  directed      Allergies as of 07/11/2018      Reactions   Oxycodone-acetaminophen Nausea And Vomiting   Penicillins    rash Did it involve swelling of the face/tongue/throat, SOB, or low BP? No Did it involve sudden or severe rash/hives, skin peeling, or any reaction on the inside of your mouth or nose? No Did you need to seek medical attention at a hospital or doctor's office? No When did it last happen?15+ years If all above answers are "NO", may proceed with cephalosporin use.      Medication List    STOP taking these medications   celecoxib 200 MG capsule Commonly known as:  CELEBREX   hydrochlorothiazide 25 MG tablet Commonly known as:  HYDRODIURIL   losartan 100 MG tablet Commonly known as:  COZAAR   naproxen 500 MG tablet Commonly known as:  NAPROSYN     TAKE these medications   amLODipine 5 MG tablet Commonly known as:  NORVASC Take 5 mg by mouth daily.   levothyroxine 25 MCG tablet Commonly known as:  SYNTHROID, LEVOTHROID Take 25 mcg by mouth daily.   multivitamin with minerals Tabs tablet Take 1 tablet by mouth daily.   omeprazole 20 MG capsule Commonly known as:  PRILOSEC Take 20 mg by mouth daily.   oxyCODONE 5 MG immediate release tablet Commonly known as:  Oxy IR/ROXICODONE Take 1 tablet (5 mg total) by mouth every 6 (six) hours as needed for severe pain.   PARoxetine 40 MG tablet Commonly known as:  PAXIL Take 40 mg by mouth daily.   solifenacin 5 MG tablet Commonly known as:  VESICARE Take 5 mg by mouth at bedtime.   vitamin B-12 1000 MCG tablet Commonly known as:  CYANOCOBALAMIN Take 1,000 mcg by mouth daily.   Vitamin D (Ergocalciferol) 1.25 MG (50000 UT) Caps capsule Commonly known as:  DRISDOL Take 50,000 Units by mouth every Sunday.   XANAX 0.25 MG tablet Generic drug:  ALPRAZolam Take 0.25 mg by mouth daily as needed for anxiety.   ZZZQUIL 50 MG/30ML Liqd Generic drug:  diphenhydrAMINE HCl Take 50 mg by mouth at  bedtime as needed (sleep).      Follow-up Information    [facility] Follow up.   Specialty:  Home Health Services Contact information: [place] [address] [phone]        Erroll Luna, MD. Schedule an appointment as soon as possible for a visit in 2 week(s).   Specialty:  General Surgery Contact information: [address] [place] [address] [phone]           Signed: Turner Daniels 07/11/2018, 7:23 AM

## 2018-07-16 DIAGNOSIS — M79605 Pain in left leg: Secondary | ICD-10-CM | POA: Diagnosis not present

## 2018-07-16 DIAGNOSIS — I10 Essential (primary) hypertension: Secondary | ICD-10-CM | POA: Diagnosis not present

## 2018-07-16 DIAGNOSIS — E039 Hypothyroidism, unspecified: Secondary | ICD-10-CM | POA: Diagnosis not present

## 2018-07-16 DIAGNOSIS — J40 Bronchitis, not specified as acute or chronic: Secondary | ICD-10-CM | POA: Diagnosis not present

## 2018-07-16 DIAGNOSIS — Z79899 Other long term (current) drug therapy: Secondary | ICD-10-CM | POA: Diagnosis not present

## 2018-07-19 DIAGNOSIS — M79662 Pain in left lower leg: Secondary | ICD-10-CM | POA: Diagnosis not present

## 2018-07-19 DIAGNOSIS — M79605 Pain in left leg: Secondary | ICD-10-CM | POA: Diagnosis not present

## 2018-07-24 DIAGNOSIS — M5432 Sciatica, left side: Secondary | ICD-10-CM | POA: Diagnosis not present

## 2018-07-24 DIAGNOSIS — I10 Essential (primary) hypertension: Secondary | ICD-10-CM | POA: Diagnosis not present

## 2018-08-29 DIAGNOSIS — J40 Bronchitis, not specified as acute or chronic: Secondary | ICD-10-CM | POA: Diagnosis not present

## 2018-08-29 DIAGNOSIS — J329 Chronic sinusitis, unspecified: Secondary | ICD-10-CM | POA: Diagnosis not present

## 2018-08-29 DIAGNOSIS — R509 Fever, unspecified: Secondary | ICD-10-CM | POA: Diagnosis not present

## 2018-08-29 DIAGNOSIS — H1010 Acute atopic conjunctivitis, unspecified eye: Secondary | ICD-10-CM | POA: Diagnosis not present

## 2018-11-12 DIAGNOSIS — M79675 Pain in left toe(s): Secondary | ICD-10-CM | POA: Diagnosis not present

## 2018-11-12 DIAGNOSIS — M205X2 Other deformities of toe(s) (acquired), left foot: Secondary | ICD-10-CM | POA: Insufficient documentation

## 2018-11-12 DIAGNOSIS — L570 Actinic keratosis: Secondary | ICD-10-CM | POA: Diagnosis not present

## 2018-11-12 DIAGNOSIS — L821 Other seborrheic keratosis: Secondary | ICD-10-CM | POA: Diagnosis not present

## 2018-11-12 DIAGNOSIS — L578 Other skin changes due to chronic exposure to nonionizing radiation: Secondary | ICD-10-CM | POA: Diagnosis not present

## 2018-11-12 DIAGNOSIS — L03032 Cellulitis of left toe: Secondary | ICD-10-CM | POA: Insufficient documentation

## 2018-11-12 DIAGNOSIS — Z85820 Personal history of malignant melanoma of skin: Secondary | ICD-10-CM | POA: Diagnosis not present

## 2018-11-12 DIAGNOSIS — L638 Other alopecia areata: Secondary | ICD-10-CM | POA: Diagnosis not present

## 2018-11-12 DIAGNOSIS — C44319 Basal cell carcinoma of skin of other parts of face: Secondary | ICD-10-CM | POA: Diagnosis not present

## 2018-11-18 DIAGNOSIS — D2271 Melanocytic nevi of right lower limb, including hip: Secondary | ICD-10-CM | POA: Diagnosis not present

## 2018-11-18 DIAGNOSIS — D045 Carcinoma in situ of skin of trunk: Secondary | ICD-10-CM | POA: Diagnosis not present

## 2018-11-18 DIAGNOSIS — C4359 Malignant melanoma of other part of trunk: Secondary | ICD-10-CM | POA: Diagnosis not present

## 2018-11-18 DIAGNOSIS — D225 Melanocytic nevi of trunk: Secondary | ICD-10-CM | POA: Diagnosis not present

## 2018-11-18 DIAGNOSIS — L821 Other seborrheic keratosis: Secondary | ICD-10-CM | POA: Diagnosis not present

## 2018-11-18 DIAGNOSIS — C4362 Malignant melanoma of left upper limb, including shoulder: Secondary | ICD-10-CM | POA: Diagnosis not present

## 2018-11-27 ENCOUNTER — Institutional Professional Consult (permissible substitution): Payer: Medicare Other | Admitting: Plastic Surgery

## 2018-11-28 DIAGNOSIS — D0362 Melanoma in situ of left upper limb, including shoulder: Secondary | ICD-10-CM | POA: Diagnosis not present

## 2018-11-28 DIAGNOSIS — C4359 Malignant melanoma of other part of trunk: Secondary | ICD-10-CM | POA: Diagnosis not present

## 2018-12-03 DIAGNOSIS — C44319 Basal cell carcinoma of skin of other parts of face: Secondary | ICD-10-CM | POA: Diagnosis not present

## 2018-12-05 DIAGNOSIS — C4359 Malignant melanoma of other part of trunk: Secondary | ICD-10-CM | POA: Diagnosis not present

## 2018-12-13 ENCOUNTER — Institutional Professional Consult (permissible substitution): Payer: Medicare Other | Admitting: Plastic Surgery

## 2018-12-30 DIAGNOSIS — C44622 Squamous cell carcinoma of skin of right upper limb, including shoulder: Secondary | ICD-10-CM | POA: Diagnosis not present

## 2019-01-02 DIAGNOSIS — C4362 Malignant melanoma of left upper limb, including shoulder: Secondary | ICD-10-CM | POA: Diagnosis not present

## 2019-02-10 DIAGNOSIS — Z8262 Family history of osteoporosis: Secondary | ICD-10-CM | POA: Diagnosis not present

## 2019-02-10 DIAGNOSIS — R29890 Loss of height: Secondary | ICD-10-CM | POA: Diagnosis not present

## 2019-02-10 DIAGNOSIS — N644 Mastodynia: Secondary | ICD-10-CM | POA: Diagnosis not present

## 2019-02-10 DIAGNOSIS — Z96652 Presence of left artificial knee joint: Secondary | ICD-10-CM | POA: Diagnosis not present

## 2019-02-10 DIAGNOSIS — M8589 Other specified disorders of bone density and structure, multiple sites: Secondary | ICD-10-CM | POA: Diagnosis not present

## 2019-02-10 DIAGNOSIS — Z803 Family history of malignant neoplasm of breast: Secondary | ICD-10-CM | POA: Diagnosis not present

## 2019-02-20 DIAGNOSIS — Z23 Encounter for immunization: Secondary | ICD-10-CM | POA: Diagnosis not present

## 2019-02-24 DIAGNOSIS — M79671 Pain in right foot: Secondary | ICD-10-CM | POA: Insufficient documentation

## 2019-02-24 DIAGNOSIS — M19071 Primary osteoarthritis, right ankle and foot: Secondary | ICD-10-CM | POA: Diagnosis not present

## 2019-03-16 IMAGING — DX DG CHEST 1V PORT
1 series · 1 of 1 positions shown · non-contrast
Comparison: 07/05/2018 and earlier.

CLINICAL DATA: Ventilator dependent respiratory failure. Follow-up
LEFT pleural effusion and bibasilar atelectasis and/or pneumonia.

EXAM:
PORTABLE CHEST 1 VIEW

[chest ap]
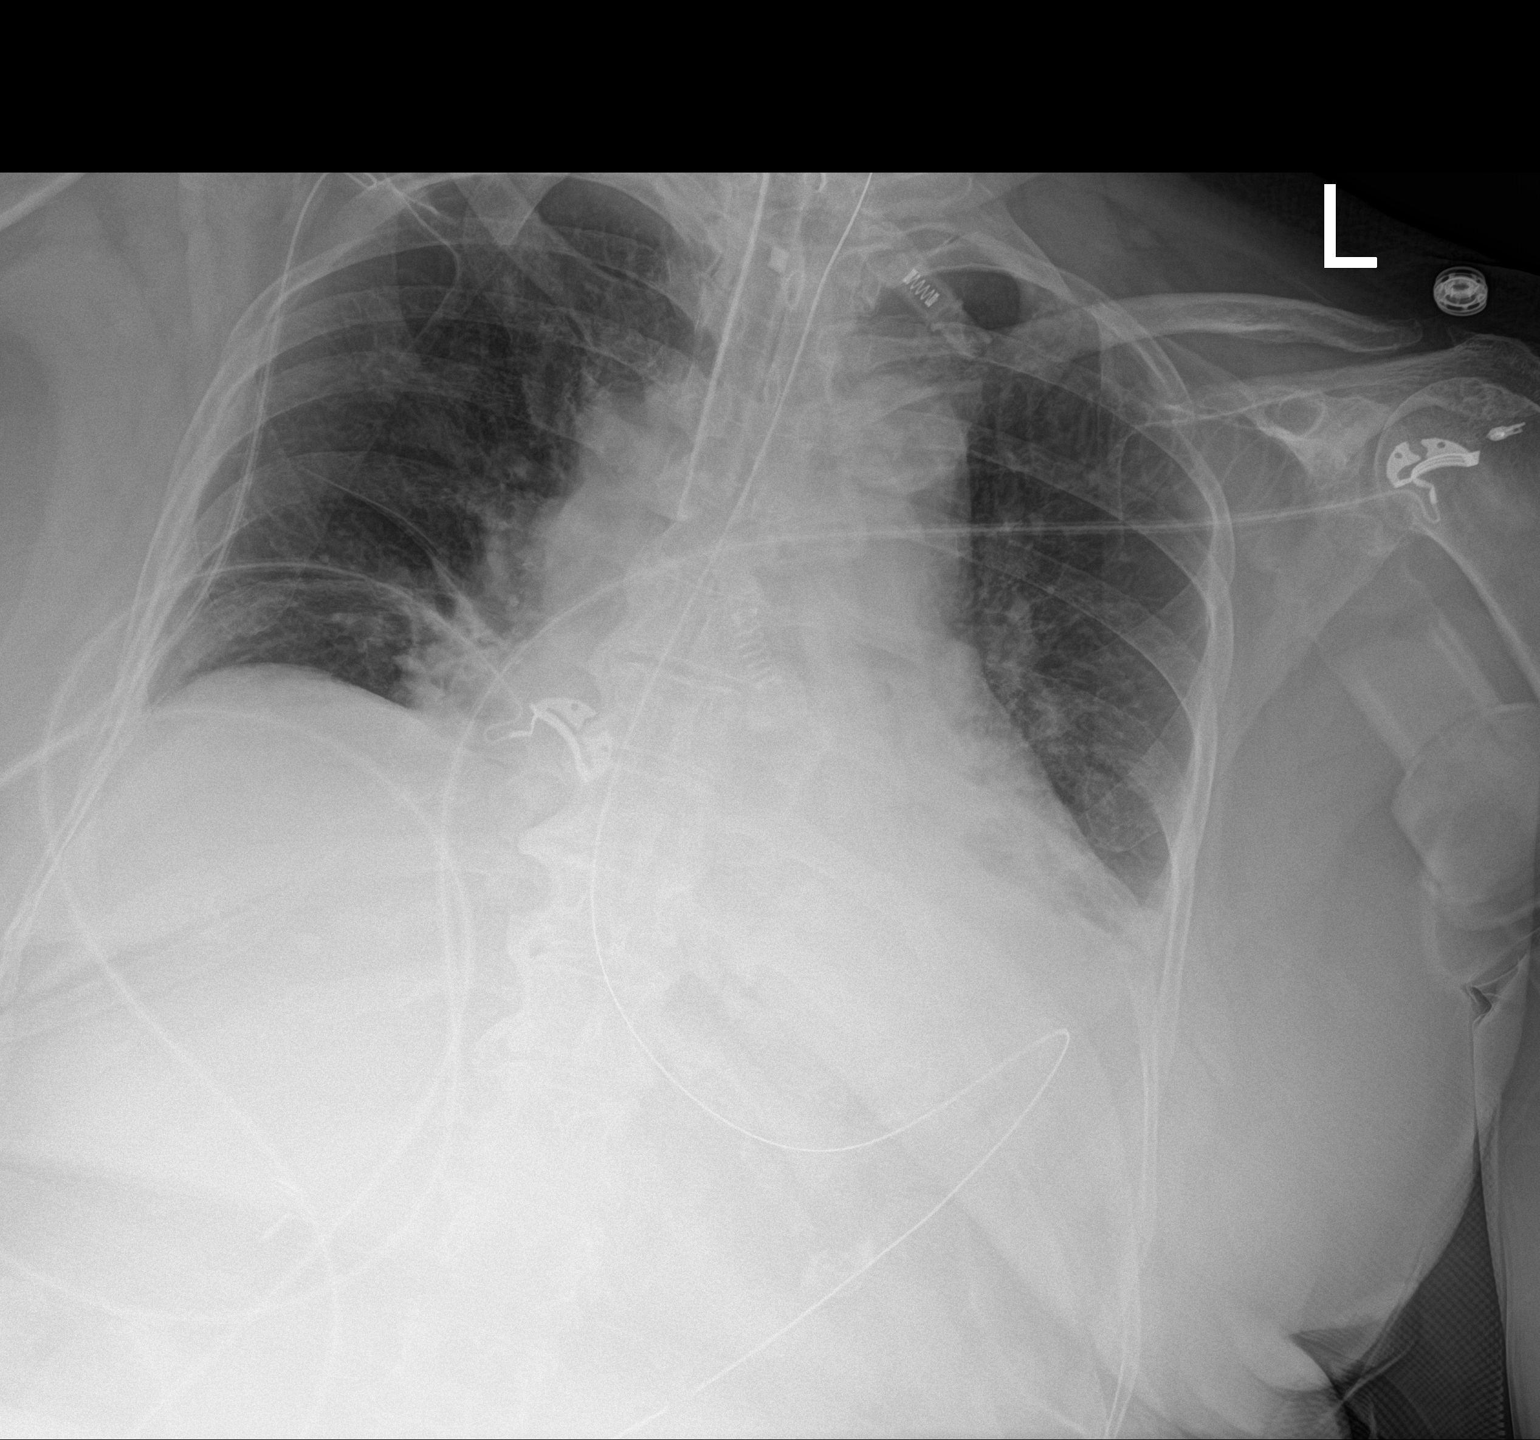

[1 of 1 positions shown; findings below may reference images not displayed]

FINDINGS: Endotracheal tube tip projects just above the carina and could be
withdrawn approximately 3 cm. Nasogastric to courses below the
diaphragm into the stomach with its tip not included. Decreasing
LEFT pleural effusion and improving aeration in the LEFT LOWER LOBE,
though a small effusion and moderate airspace consolidation
persists. Stable mild atelectasis in the RIGHT LOWER LOBE. Cardiac
silhouette mildly enlarged for AP portable technique, unchanged.
Pulmonary venous hypertension without overt edema.
IMPRESSION: 1. Endotracheal tube tip just above the carina. This could be
withdrawn approximately 3 cm for more appropriate positioning.
2. Remaining support apparatus satisfactory.
3. Improved LEFT pleural effusion and improving aeration in the LEFT
LOWER LOBE since yesterday. A small effusion and moderate LEFT LOWER
LOBE atelectasis and/or pneumonia persists.
4. Stable mild RIGHT basilar atelectasis.

These results will be called to the ordering clinician or
representative by the Radiologist Assistant, and communication
documented in the PACS or zVision Dashboard.

## 2019-03-17 IMAGING — DX DG CHEST 1V PORT
1 series · 1 of 1 positions shown · non-contrast
Comparison: July 06, 2018

CLINICAL DATA: Status post extubation

EXAM:
PORTABLE CHEST 1 VIEW

[chest ap]
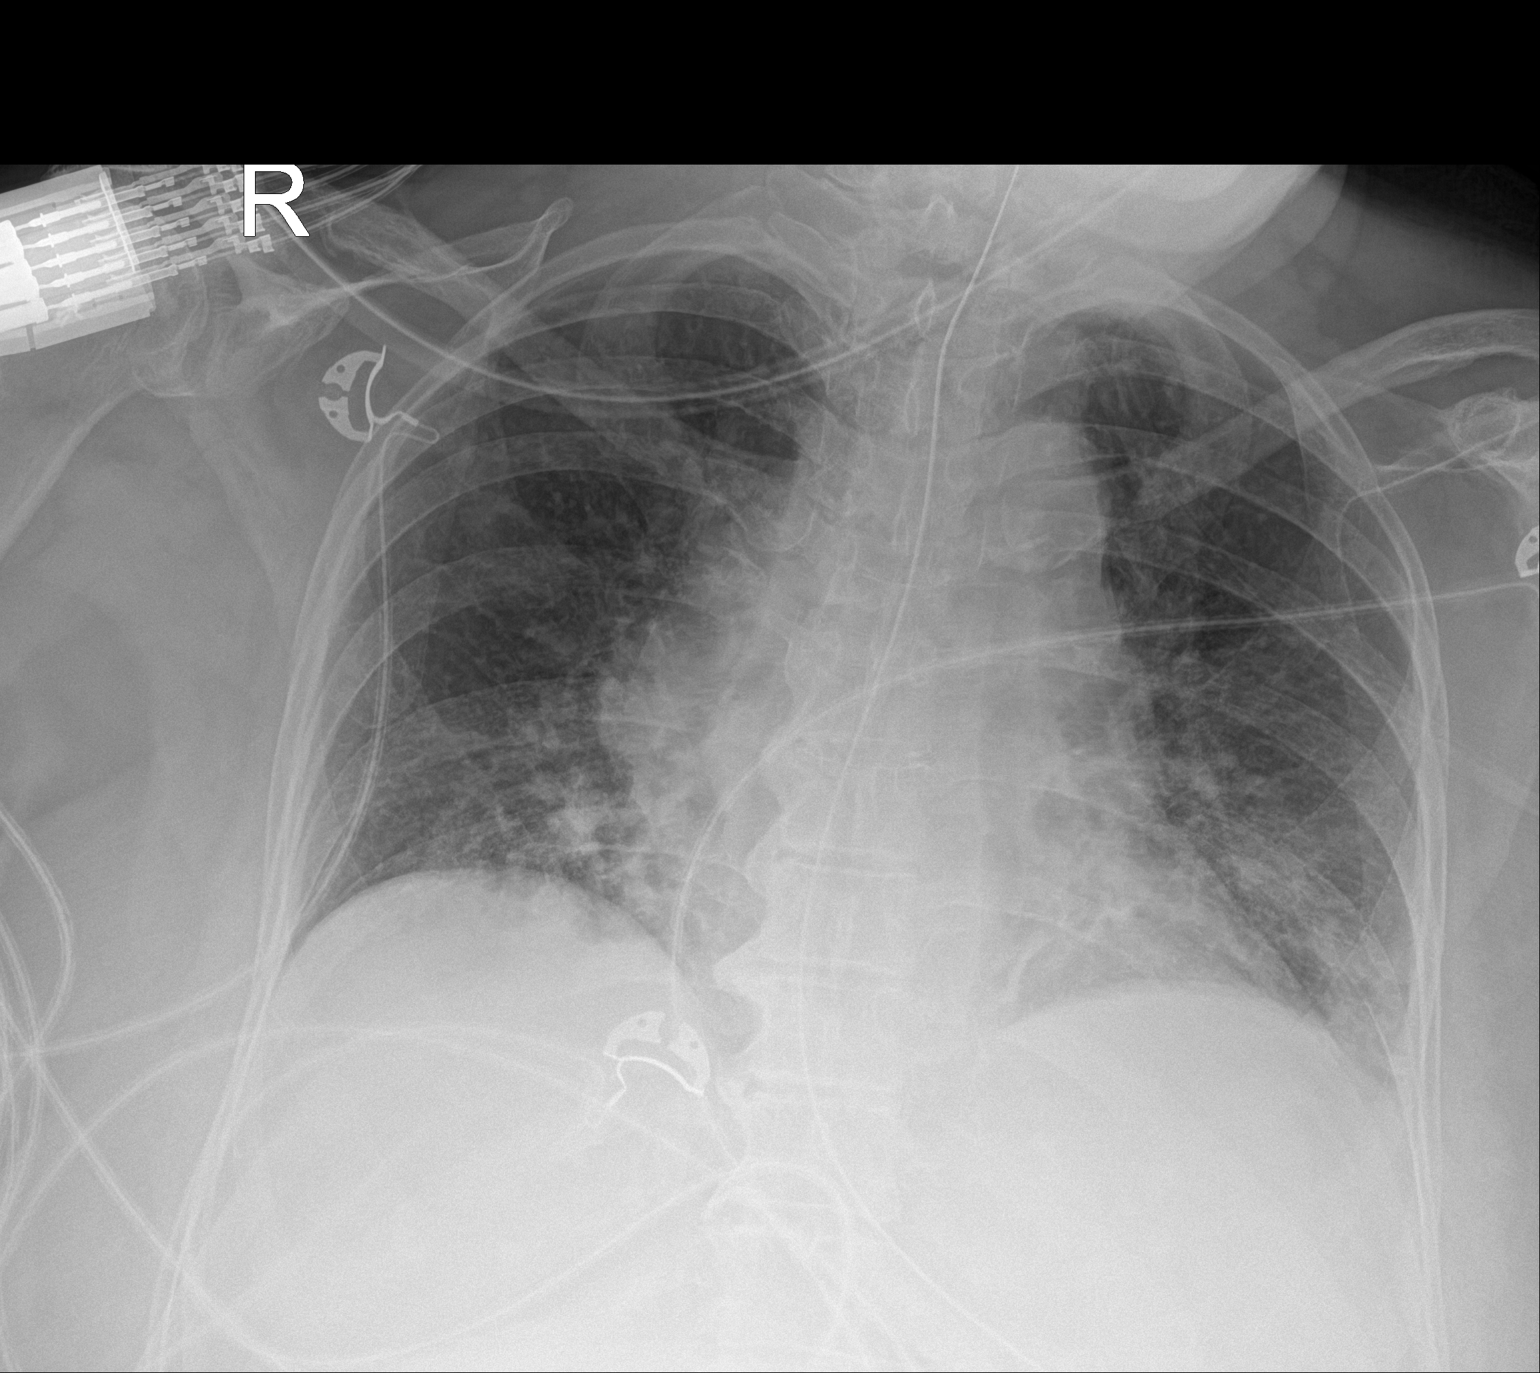

[1 of 1 positions shown; findings below may reference images not displayed]

FINDINGS: Endotracheal tube no longer present. Nasogastric tube tip and side
port are below the diaphragm. No pneumothorax. There is atelectatic
change in the left base region. There is no frank edema or
consolidation. Heart is upper normal in size with pulmonary
vascularity normal. No adenopathy. There is aortic atherosclerosis.
No evident bone lesions.
IMPRESSION: Nasogastric tube tip and side port below the diaphragm. No
pneumothorax. Left base atelectasis. Lungs elsewhere clear. Stable
cardiac silhouette.

Aortic Atherosclerosis ([id number]-[id number]).

## 2019-03-17 IMAGING — DX DG ABDOMEN 1V
2 series · 2 of 2 positions shown · non-contrast
Comparison: None.

CLINICAL DATA: Hernia repair [REDACTED]. RIGHT lower quadrant pain
today.

EXAM:
ABDOMEN - 1 VIEW

[abdomen kub (1 of 2)]
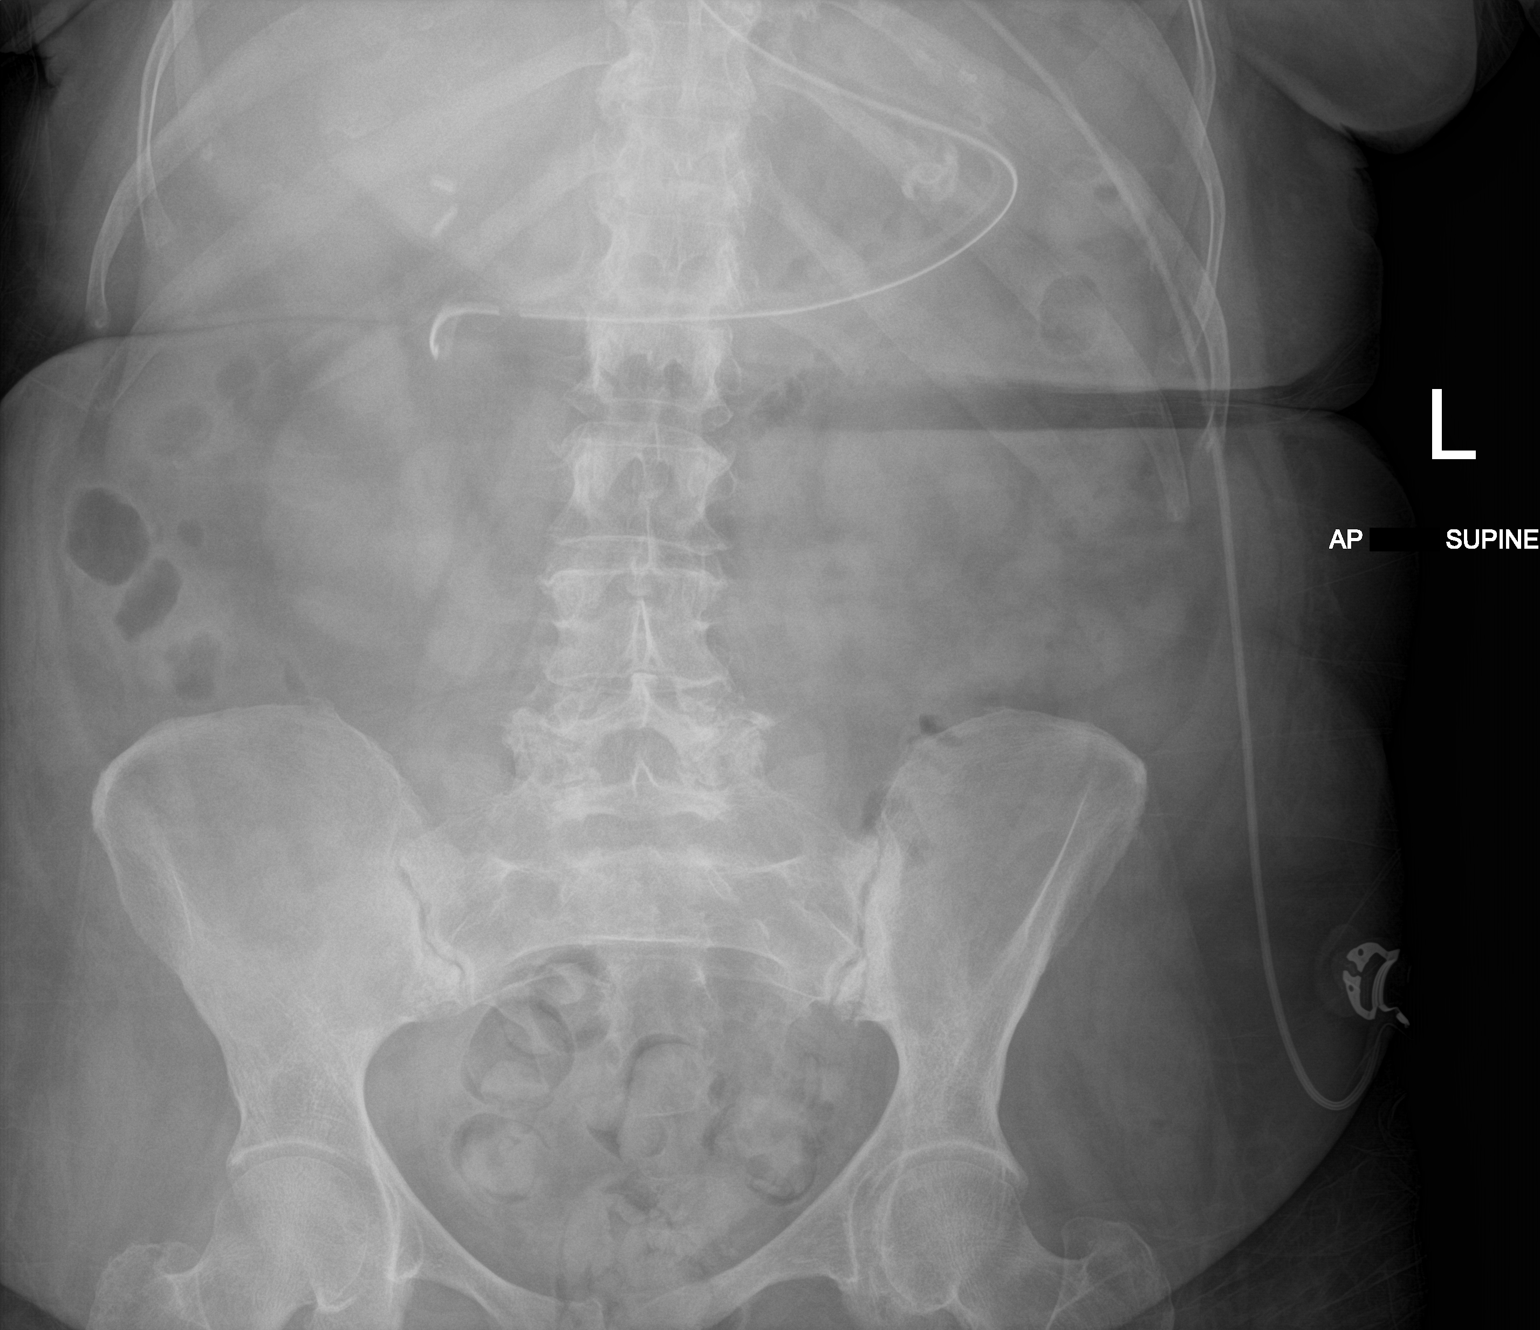

[abdomen kub (2 of 2)]
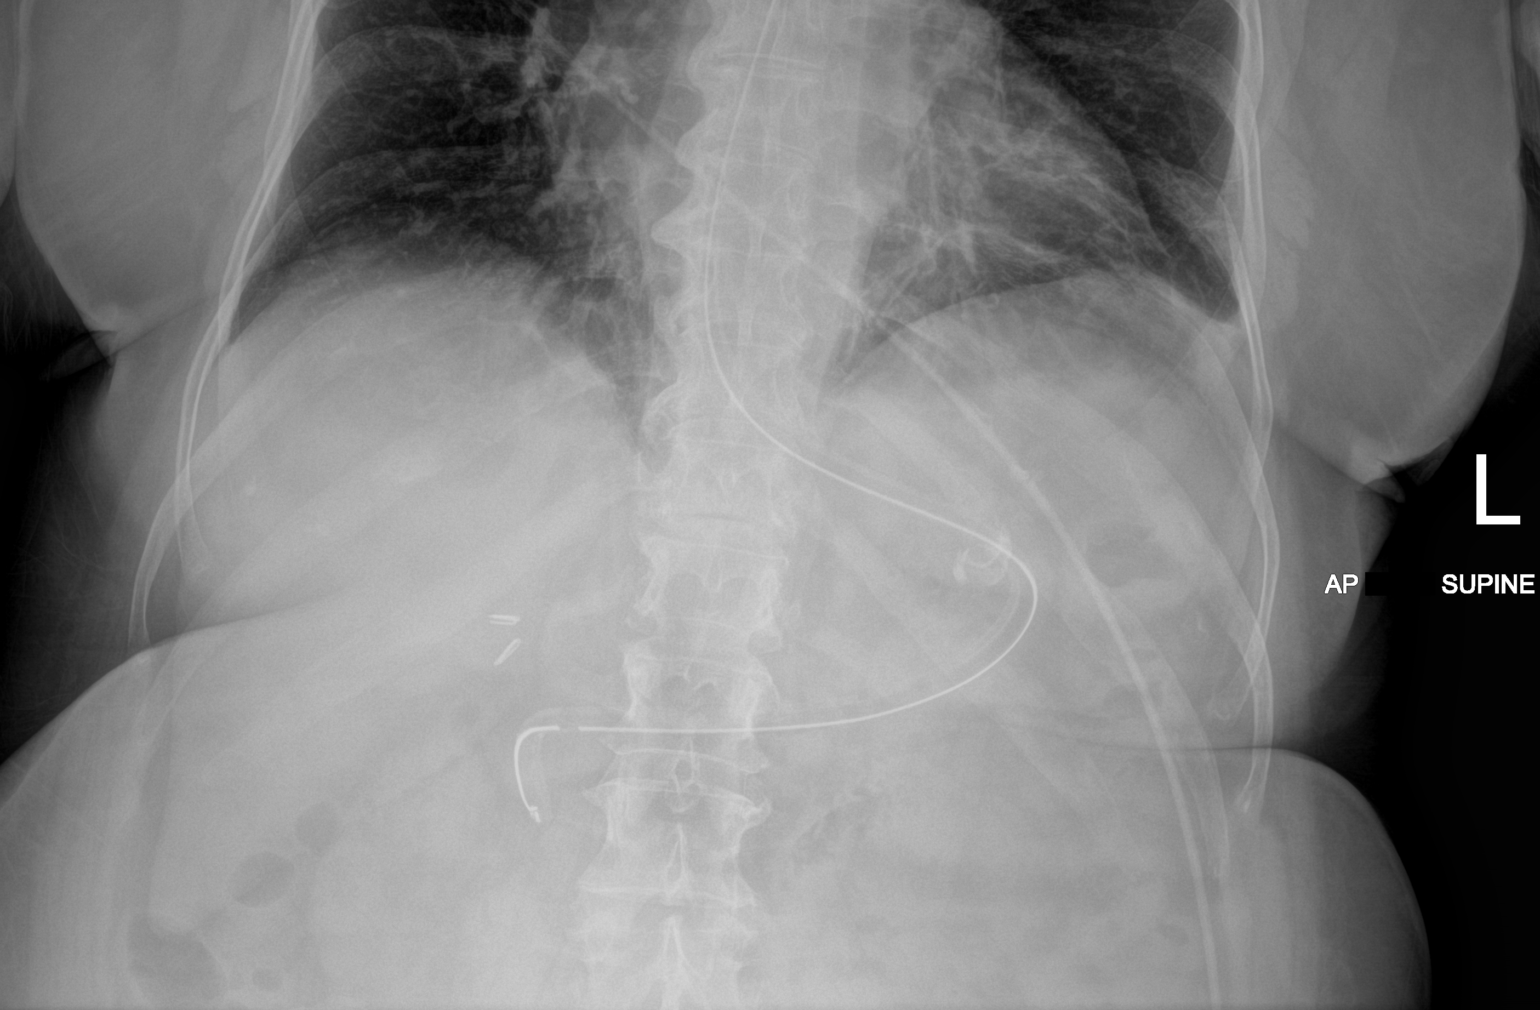

[2 of 2 positions shown; findings below may reference images not displayed]

FINDINGS: Enteric tube appears well positioned with tip at the level of the
gastric pylorus or duodenal bulb.

Bowel gas pattern appears non obstructive. No evidence of abnormal
fluid collection. No evidence of free intraperitoneal air.
IMPRESSION: 1. Enteric tube well positioned with tip at the level of the gastric
pylorus or duodenal bulb.
2. Nonobstructive bowel gas pattern.

## 2019-03-20 IMAGING — DX DG CHEST 2V SAME DAY
2 series · 2 of 2 positions shown · non-contrast
Comparison: 07/07/2018

CLINICAL DATA: Cough and short of breath

EXAM:
CHEST - 2 VIEW SAME DAY

[w chest lat]
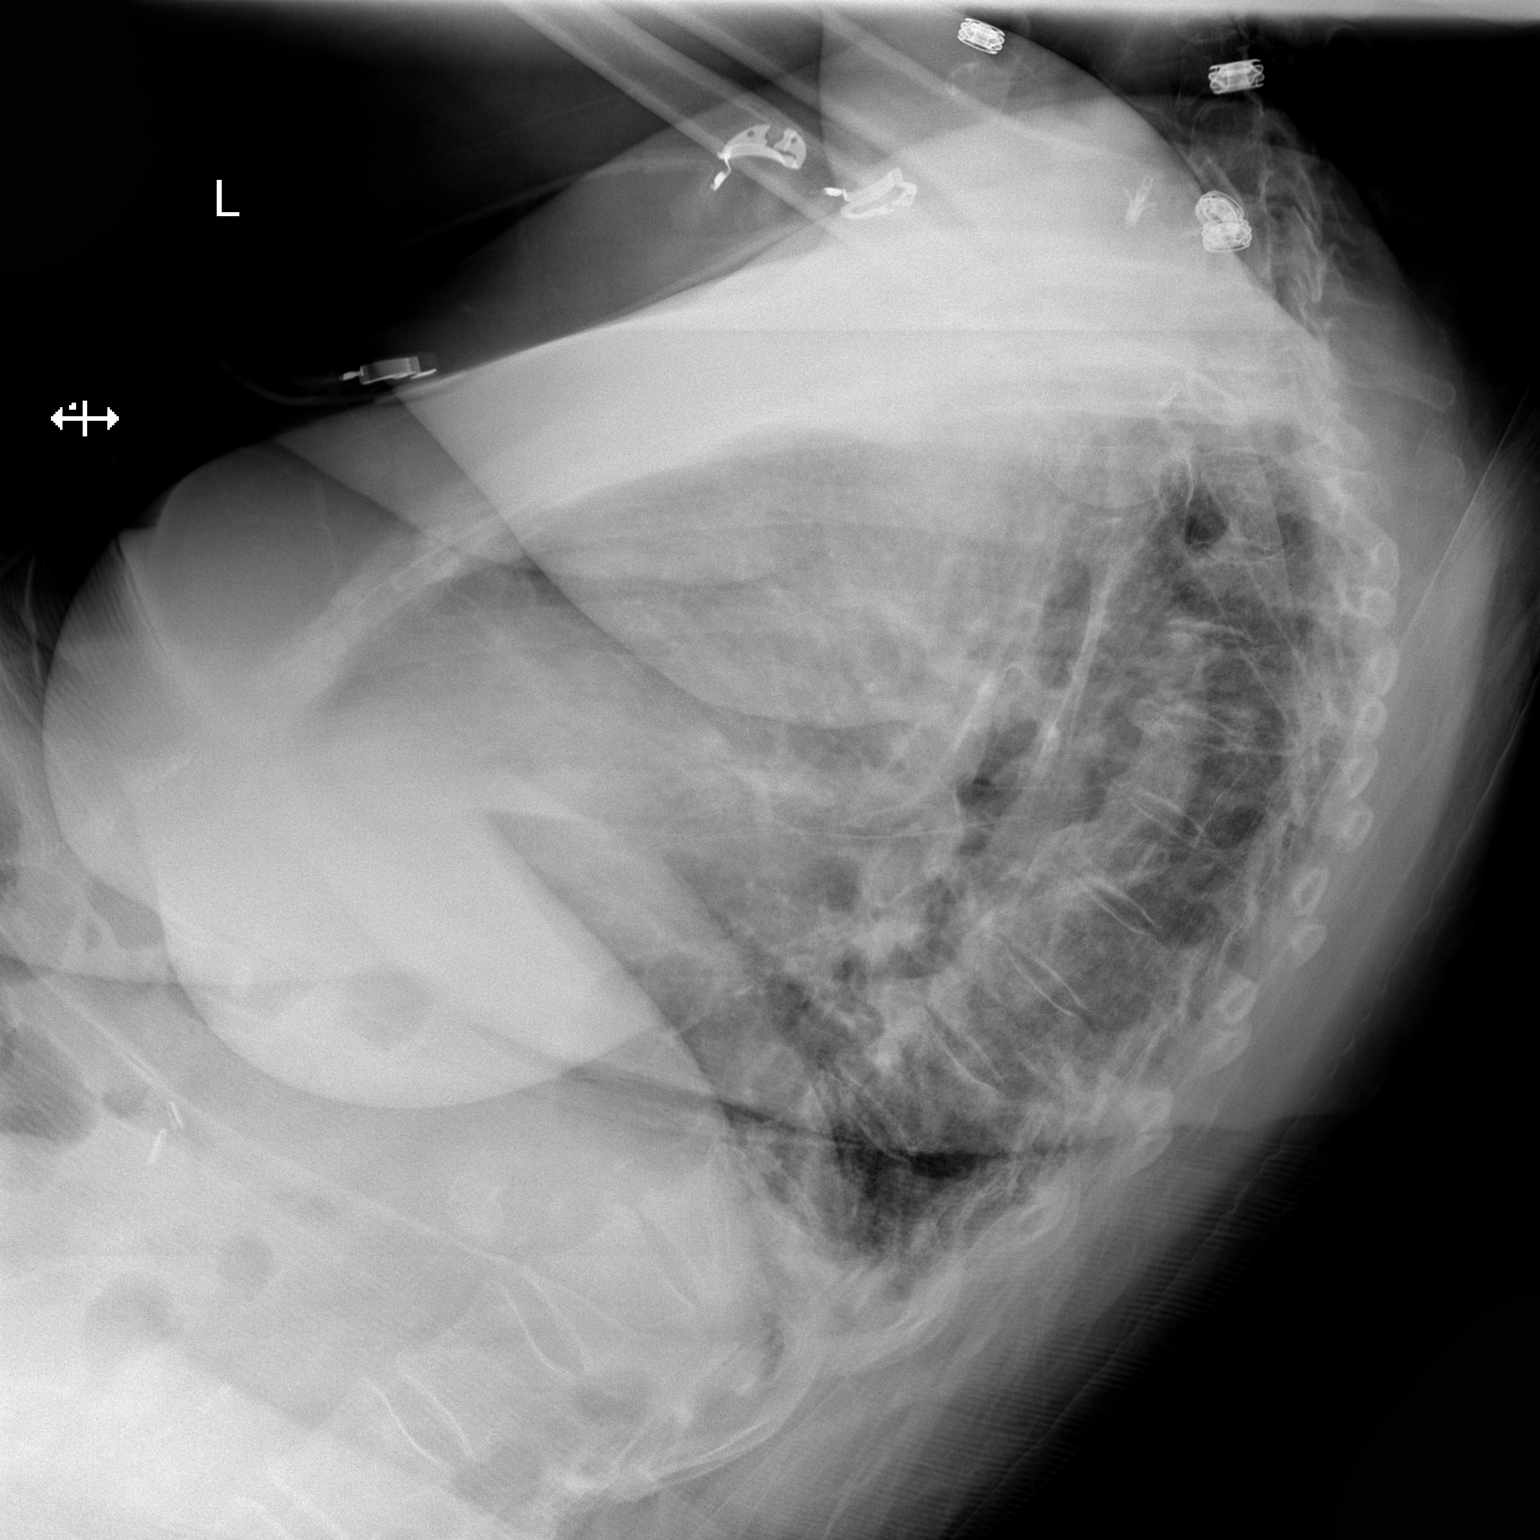

[x chest ap]
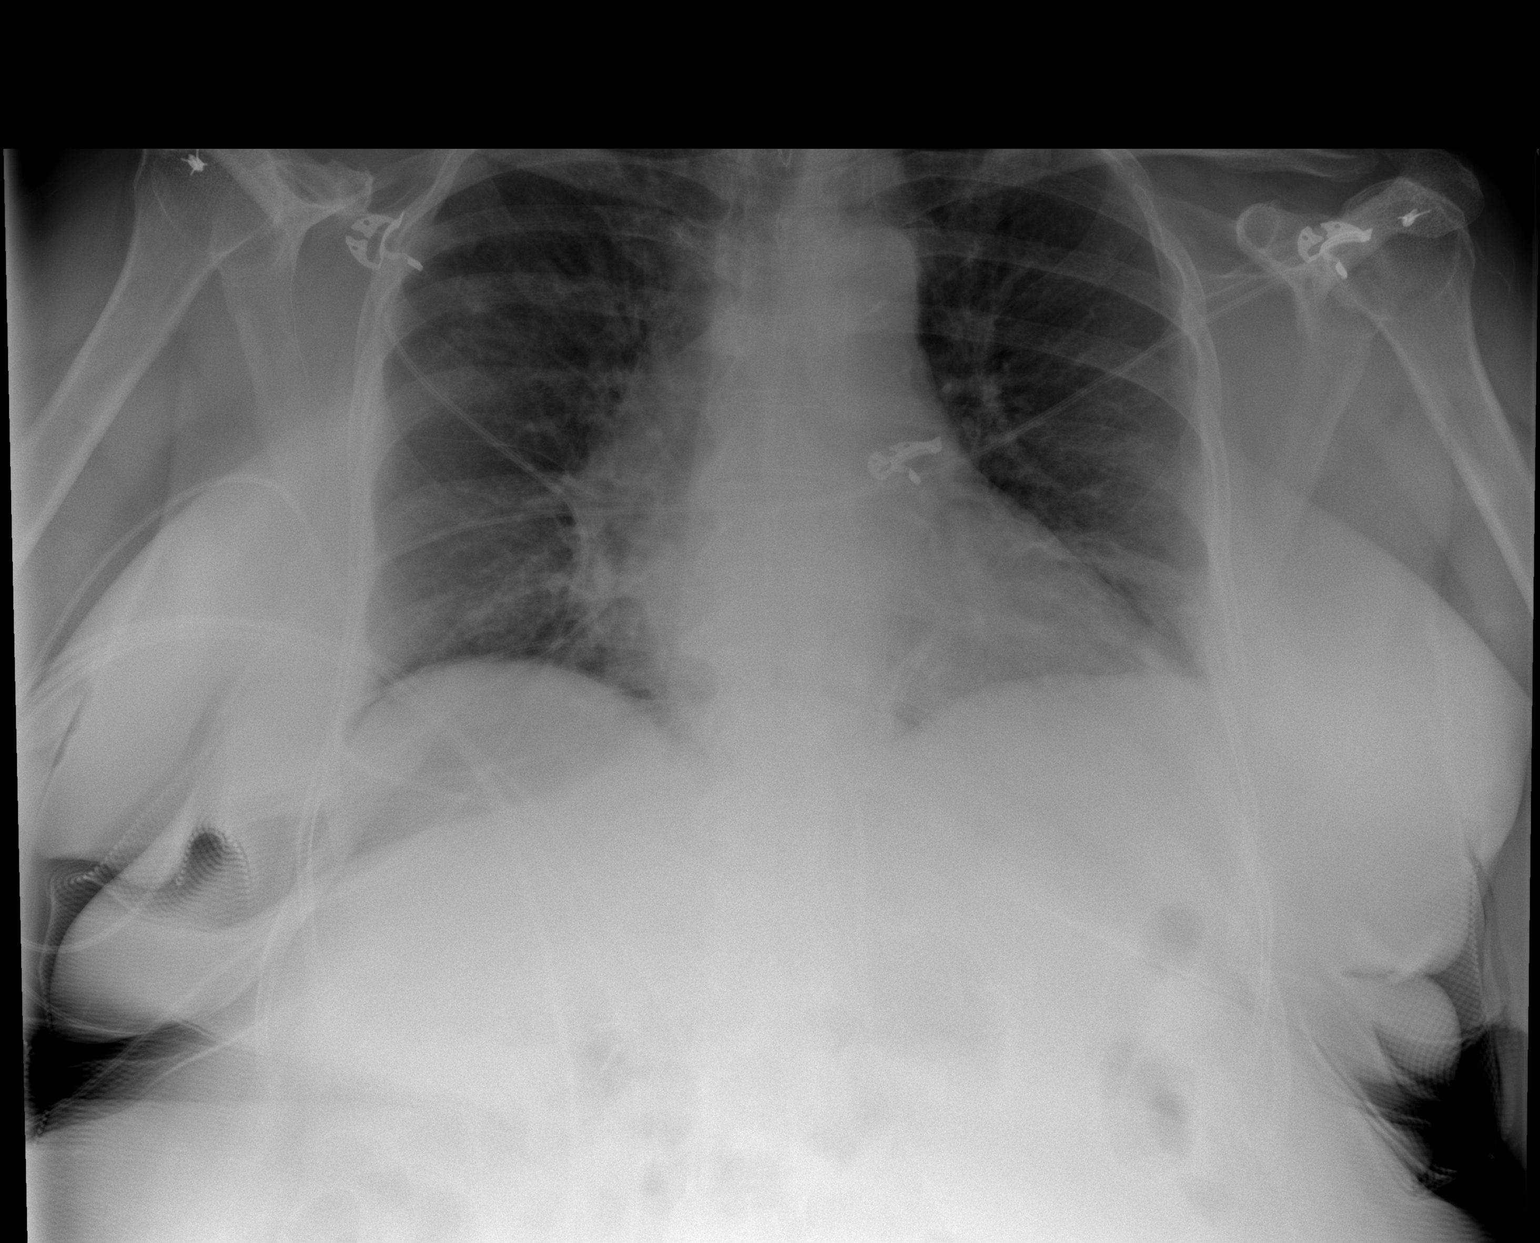

[2 of 2 positions shown; findings below may reference images not displayed]

FINDINGS: NG tube removed.

Heart size upper normal. Negative for heart failure. Improvement in
mild bibasilar atelectasis. No effusion. No new area of airspace
disease
IMPRESSION: Interval improvement in mild bibasilar airspace disease. NG removed.

## 2019-07-20 DIAGNOSIS — E039 Hypothyroidism, unspecified: Secondary | ICD-10-CM | POA: Diagnosis not present

## 2019-07-20 DIAGNOSIS — M8580 Other specified disorders of bone density and structure, unspecified site: Secondary | ICD-10-CM | POA: Diagnosis not present

## 2019-07-20 DIAGNOSIS — I10 Essential (primary) hypertension: Secondary | ICD-10-CM | POA: Diagnosis not present

## 2019-08-19 ENCOUNTER — Other Ambulatory Visit: Payer: Self-pay | Admitting: Surgery

## 2019-08-19 DIAGNOSIS — R1032 Left lower quadrant pain: Secondary | ICD-10-CM

## 2019-08-20 DIAGNOSIS — M8580 Other specified disorders of bone density and structure, unspecified site: Secondary | ICD-10-CM | POA: Diagnosis not present

## 2019-08-20 DIAGNOSIS — E559 Vitamin D deficiency, unspecified: Secondary | ICD-10-CM | POA: Diagnosis not present

## 2019-08-20 DIAGNOSIS — I10 Essential (primary) hypertension: Secondary | ICD-10-CM | POA: Diagnosis not present

## 2019-09-01 DIAGNOSIS — I10 Essential (primary) hypertension: Secondary | ICD-10-CM | POA: Diagnosis not present

## 2019-09-01 DIAGNOSIS — G8929 Other chronic pain: Secondary | ICD-10-CM | POA: Diagnosis not present

## 2019-09-01 DIAGNOSIS — E039 Hypothyroidism, unspecified: Secondary | ICD-10-CM | POA: Diagnosis not present

## 2019-09-01 DIAGNOSIS — Z79899 Other long term (current) drug therapy: Secondary | ICD-10-CM | POA: Diagnosis not present

## 2019-09-01 DIAGNOSIS — Z0000 Encounter for general adult medical examination without abnormal findings: Secondary | ICD-10-CM | POA: Diagnosis not present

## 2019-09-02 ENCOUNTER — Other Ambulatory Visit: Payer: Self-pay | Admitting: Surgery

## 2019-09-02 DIAGNOSIS — R1904 Left lower quadrant abdominal swelling, mass and lump: Secondary | ICD-10-CM

## 2019-09-02 DIAGNOSIS — R1032 Left lower quadrant pain: Secondary | ICD-10-CM

## 2019-09-03 ENCOUNTER — Other Ambulatory Visit: Payer: Self-pay | Admitting: Surgery

## 2019-09-03 DIAGNOSIS — R1904 Left lower quadrant abdominal swelling, mass and lump: Secondary | ICD-10-CM

## 2019-09-03 DIAGNOSIS — R1032 Left lower quadrant pain: Secondary | ICD-10-CM

## 2019-09-04 ENCOUNTER — Ambulatory Visit
Admission: RE | Admit: 2019-09-04 | Discharge: 2019-09-04 | Disposition: A | Discharge status: Home / Self Care | Payer: Medicare Other | Source: Ambulatory Visit | Attending: Surgery | Admitting: Surgery

## 2019-09-04 DIAGNOSIS — R1904 Left lower quadrant abdominal swelling, mass and lump: Secondary | ICD-10-CM

## 2019-09-04 DIAGNOSIS — R1032 Left lower quadrant pain: Secondary | ICD-10-CM

## 2019-09-04 DIAGNOSIS — K439 Ventral hernia without obstruction or gangrene: Secondary | ICD-10-CM | POA: Diagnosis not present

## 2019-09-08 DIAGNOSIS — R1900 Intra-abdominal and pelvic swelling, mass and lump, unspecified site: Secondary | ICD-10-CM | POA: Diagnosis not present

## 2019-09-18 ENCOUNTER — Encounter: Payer: Self-pay | Admitting: Neurology

## 2019-10-16 DIAGNOSIS — C4441 Basal cell carcinoma of skin of scalp and neck: Secondary | ICD-10-CM | POA: Diagnosis not present

## 2019-10-16 DIAGNOSIS — L578 Other skin changes due to chronic exposure to nonionizing radiation: Secondary | ICD-10-CM | POA: Diagnosis not present

## 2019-10-16 DIAGNOSIS — L821 Other seborrheic keratosis: Secondary | ICD-10-CM | POA: Diagnosis not present

## 2019-10-16 DIAGNOSIS — L905 Scar conditions and fibrosis of skin: Secondary | ICD-10-CM | POA: Diagnosis not present

## 2019-10-16 DIAGNOSIS — C44519 Basal cell carcinoma of skin of other part of trunk: Secondary | ICD-10-CM | POA: Diagnosis not present

## 2019-10-16 DIAGNOSIS — L720 Epidermal cyst: Secondary | ICD-10-CM | POA: Diagnosis not present

## 2019-10-23 ENCOUNTER — Ambulatory Visit: Payer: Medicare Other | Admitting: Neurology

## 2019-11-06 NOTE — Progress Notes (Signed)
Assessment/Plan:   1.  cervical dystonia   -records indicate oromandicular dystonia but didn't note that although jaw tremor is present  -discussed botox and changing pattern of injections to see if would be helpful.  She would like to do that.  The patient was educated on the botulinum toxin the black blox warning and given a copy of the botox patient medication guide.  The patient understands that this warning states that there have been reported cases of the Botox extending beyond the injection site and creating adverse effects, similar to those of botulism. This included loss of strength, trouble walking, hoarseness, trouble saying words clearly, loss of bladder control, trouble breathing, trouble swallowing, diplopia, blurry vision and ptosis. Most of the distant spread of Botox was happening in patients, primarily children, who received medication for spasticity or for cervical dystonia. The patient expressed understanding and desire to proceed.  -primary m. Involved L levator scapulae (warned of increased risk of dysphagia b/c of this), R SCM, L [place] (prior auth for 300 U) Subjective:   Elizabeth Mcclain was seen today in neurologic consultation at the request of Angelina Sheriff, MD.  The consultation is for the evaluation of cervical dystonia.  Prior medical records made available to me are reviewed.  Patient has previously been seen and treated at [facility] by Dr. Gilford Rile for the same.  She started seeing Dr. Gilford Rile in October, 2018 this last time but had seen [facility] in 2013 as well for injections.  Patient with history of oromandibular and cervical dystonia.  Patient was receiving Botox injections from [facility].  It appears that last injections were in March, 2019.  Patient's last pattern of injections were as follows: EMG-Guided Botox  Side Muscle # of Units  L Masseter 25  R Masseter 25  L Temporalis 25  R Temporalis 25  L OCI 15  R OCI 15  L [place] 10  R [place] 10  L LS 10  R LS 10  R C6  10 L C5 10 R PS 10 Prior to Dr. Gilford Rile, the patient saw Dr. Chales Salmon.  The patient is a 74 y.o. year old female with a history of dystonia.  The first symptom began 15+ years ago.  She noted head tremor and then she noted mouth movements (she isn't sure how long it took that to start in relation to head tremor).  She has some neck pain on the L.  She thinks that her botox helped "a little" but "not that much."   PREVIOUS MEDICATIONS: Atenolol (leg cramping) gabapentin (no help); primidone (was prescribed but no mention of response); Xanax (helped a little); clonazepam (helped a little)  ALLERGIES:   Allergies  Allergen Reactions  . Oxycodone-Acetaminophen Nausea And Vomiting  . Penicillins     rash Did it involve swelling of the face/tongue/throat, SOB, or low BP? No Did it involve sudden or severe rash/hives, skin peeling, or any reaction on the inside of your mouth or nose? No Did you need to seek medical attention at a hospital or doctor's office? No When did it last happen?15+ years If all above answers are "NO", may proceed with cephalosporin use.     CURRENT MEDICATIONS:  Outpatient Encounter Medications as of 11/11/2019  Medication Sig  . ALPRAZolam (XANAX) 0.25 MG tablet Take 0.25 mg by mouth daily as needed for anxiety.   Marland Kitchen amLODipine (NORVASC) 5 MG tablet Take 5 mg by mouth daily.  . diphenhydrAMINE HCl (ZZZQUIL) 50 MG/30ML LIQD  Take 50 mg by mouth at bedtime as needed (sleep).  . gabapentin (NEURONTIN) 300 MG capsule Take 1 capsule (300 mg total) by mouth 3 (three) times daily. As needed for pain  . levothyroxine (SYNTHROID, LEVOTHROID) 25 MCG tablet Take 25 mcg by mouth daily.  . methocarbamol (ROBAXIN) 500 MG tablet Take 1 tablet (500 mg total) by mouth every 6 (six) hours as needed for muscle spasms.  . Multiple Vitamin (MULTIVITAMIN WITH MINERALS) TABS tablet Take 1 tablet by mouth daily.  Marland Kitchen omeprazole (PRILOSEC) 20 MG capsule Take 20 mg by mouth daily.  Marland Kitchen  PARoxetine (PAXIL) 40 MG tablet Take 40 mg by mouth daily.   . solifenacin (VESICARE) 5 MG tablet Take 5 mg by mouth at bedtime.  . vitamin B-12 (CYANOCOBALAMIN) 1000 MCG tablet Take 1,000 mcg by mouth daily.  . Vitamin D, Ergocalciferol, (DRISDOL) 50000 units CAPS capsule Take 50,000 Units by mouth every Sunday.    No facility-administered encounter medications on file as of 11/11/2019.    Objective:   PHYSICAL EXAMINATION:    VITALS:   Vitals:   11/11/19 1328  BP: (!) 148/87  Pulse: 91  SpO2: 97%  Weight: 166 lb (75.3 kg)  Height: 5' (1.524 m)    GEN:  Normal appears female in no acute distress.  Appears stated age. HEENT:  Normocephalic, atraumatic. The mucous membranes are moist. The superficial temporal arteries are without ropiness or tenderness.  There is jaw tremor Neck/MS:   The head/neck is turned to the R and sidebent to the left.  There is associated very irregular head tremor  NEUROLOGICAL: Orientation:  The patient is alert and oriented x 3.   Cranial nerves: There is good facial symmetry.  Extraocular muscles are intact and visual fields are full to confrontational testing. Speech is fluent and clear. Soft palate rises symmetrically and there is no tongue deviation. Hearing is intact to conversational tone. Tone: Tone is good throughout. Sensation: Sensation is intact to light touch and pinprick throughout (facial, trunk, extremities). Vibration is intact at the bilateral big toe. There is no extinction with double simultaneous stimulation. There is no sensory dermatomal level identified. Coordination:  The patient has no difficulty with RAM's or FNF bilaterally. Motor: Strength is 5/5 in the bilateral upper and lower extremities.  Shoulder shrug is equal and symmetric. There is no pronator drift.  There are no fasciculations noted. DTR's: Deep tendon reflexes are 3/4 at the bilateral biceps, triceps, brachioradialis, patella.  Plantar responses are downgoing  bilaterally. Gait and Station: The patient is just slightly unsteady/wide based with ambulation.    Total time spent on today's visit was 45 minutes, including both face-to-face time and nonface-to-face time.  Time included that spent on review of records (prior notes available to me/labs/imaging if pertinent), discussing treatment and goals, answering patient's questions and coordinating care.   Cc:  Angelina Sheriff, MD

## 2019-11-11 ENCOUNTER — Ambulatory Visit: Payer: Medicare Other | Admitting: Neurology

## 2019-11-11 ENCOUNTER — Other Ambulatory Visit: Payer: Self-pay

## 2019-11-11 ENCOUNTER — Encounter: Payer: Self-pay | Admitting: Neurology

## 2019-11-11 VITALS — BP 148/87 | HR 91 | Ht 60.0 in | Wt 166.0 lb

## 2019-11-11 DIAGNOSIS — G243 Spasmodic torticollis: Secondary | ICD-10-CM | POA: Diagnosis not present

## 2019-11-11 NOTE — Patient Instructions (Signed)
We will try to get you prior authorized for the botox.  The physicians and staff at [facility] are committed to providing excellent care. You may receive a survey requesting feedback about your experience at our office. We strive to receive "very good" responses to the survey questions. If you feel that your experience would prevent you from giving the office a "very good " response, please contact our office to try to remedy the situation. We may be reached at [phone]. Thank you for taking the time out of your busy day to complete the survey.

## 2019-11-12 ENCOUNTER — Encounter: Payer: Self-pay | Admitting: Neurology

## 2019-11-12 NOTE — Progress Notes (Signed)
BV submitted on [employer] for patient. Dr. Carles Collet wants patient to have botox injections 300 units for Cervical Dystonia. Awaiting BV determination to see if we need PA.

## 2019-11-13 NOTE — Progress Notes (Addendum)
BV came back [place] and bill  Elizabeth Mcclain Key: [id number] - PA Case ID: [id number] Need help? Call us at [phone] Outcome Approvedtoday Request Reference Number: [id number]. BOTOX INJ 100UNIT is approved through 02/13/2020. Your patient may now fill this prescription and it will be covered. Drug Botox 100UNIT solution Form [employer] Medicare Part D Electronic Prior Authorization Form (2017 NCPDP)

## 2019-11-29 DIAGNOSIS — B029 Zoster without complications: Secondary | ICD-10-CM | POA: Diagnosis not present

## 2019-12-05 ENCOUNTER — Institutional Professional Consult (permissible substitution): Payer: Medicare Other | Admitting: Plastic Surgery

## 2019-12-09 ENCOUNTER — Institutional Professional Consult (permissible substitution): Payer: Medicare Other | Admitting: Plastic Surgery

## 2020-01-02 ENCOUNTER — Ambulatory Visit (INDEPENDENT_AMBULATORY_CARE_PROVIDER_SITE_OTHER): Payer: Medicare Other | Admitting: Neurology

## 2020-01-02 ENCOUNTER — Ambulatory Visit: Payer: Medicare Other | Admitting: Neurology

## 2020-01-02 ENCOUNTER — Other Ambulatory Visit: Payer: Self-pay

## 2020-01-02 DIAGNOSIS — G244 Idiopathic orofacial dystonia: Secondary | ICD-10-CM | POA: Diagnosis not present

## 2020-01-02 DIAGNOSIS — G243 Spasmodic torticollis: Secondary | ICD-10-CM

## 2020-01-02 MED ORDER — ONABOTULINUMTOXINA 100 UNITS IJ SOLR
170.0000 [IU] | Freq: Once | INTRAMUSCULAR | Status: AC
  Administered 2020-01-02: 170 [IU] via INTRAMUSCULAR

## 2020-01-02 NOTE — Procedures (Signed)
[facility]   Procedure Note Botox  Attending: Dr. Wells Guiles Natavia Sublette  Preoperative Diagnosis(es): Cervical Dystonia  Consent obtained from: The patient Benefits discussed included, but were not limited to decreased muscle tightness, increased joint range of motion, and decreased pain.  Risk discussed included, but were not limited pain and discomfort, bleeding, bruising, excessive weakness, venous thrombosis, muscle atrophy and dysphagia.  A copy of the patient medication guide was given to the patient which explains the blackbox warning.  Patients identity and treatment sites confirmed Yes.  .  Details of Procedure: Skin was cleaned with alcohol.  A 30 gauge, 71mm  needle was introduced to the target muscle, except for posterior splenius where 27 gauge, 1.5 inch needle used.   Prior to injection, the needle plunger was aspirated to make sure the needle was not within a blood vessel.  There was no blood retrieved on aspiration.    Following is a summary of the muscles injected  And the amount of Botulinum toxin used:   Dilution 0.9% preservative free saline mixed with 100 u Botox type A to make 10 U per 0.1cc  Injections  Location Left  Right Units Number of sites        Sternocleidomastoid 50  50 1  Splenius Capitus, posterior approach  70 70 1  Splenius Capitus, lateral approach  30 30 1   Levator Scapulae 20  20 1   Trapezius      masseter 20  20 1   temporalis 20  10 1   TOTAL UNITS:   200    Agent: Botulinum Type A ( Onobotulinum Toxin type A ).  2 vials of Botox were used, each containing 100 units and freshly diluted with 1 mL of sterile, non-preserved saline   Total injected (Units): 200  Total wasted (Units): none wasted   Pt tolerated procedure well without complications.   Reinjection is anticipated in 3 months.  Clinical comment:  Once in room, pt noted that she had trouble with jaw.  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  Jaw trouble on the L.  Is reason  injections done there today.

## 2020-01-02 NOTE — Addendum Note (Signed)
Addended by: Ulice Brilliant T on: 01/02/2020 10:25 AM   Modules accepted: Orders

## 2020-01-19 ENCOUNTER — Ambulatory Visit (INDEPENDENT_AMBULATORY_CARE_PROVIDER_SITE_OTHER): Payer: Self-pay | Admitting: Plastic Surgery

## 2020-01-19 ENCOUNTER — Other Ambulatory Visit: Payer: Self-pay

## 2020-01-19 ENCOUNTER — Encounter: Payer: Self-pay | Admitting: Plastic Surgery

## 2020-01-19 DIAGNOSIS — Z719 Counseling, unspecified: Secondary | ICD-10-CM | POA: Insufficient documentation

## 2020-01-19 NOTE — Progress Notes (Signed)
Patient ID: Elizabeth Mcclain, female    DOB: Nov 27, 1945, 74 y.o.   MRN: [id number]   Chief Complaint  Patient presents with  . Consult    The patient is a lovely 74 yrs old wf here for consultation for facial rejuvenation.  She had a face lift by Dr. Wendy Poet ~ 74 years ago.  She would like to improve the mid and lower fact.  She also had a skin cancer excised from the right cheek this year.  She does not like the way the scar healed.  It is ~ 2 cm in size.  There is no sign of infection.  She has severe midfacial volume loss.  She has loose skin on the neck.  I don't see any skin lesions of concern.  She is not a smoker has hypertension, thyroid disease and Factor V Leiden.  and is otherwise in good health.    Review of Systems  Constitutional: Negative.   HENT: Negative.   Eyes: Negative.   Respiratory: Negative.   Cardiovascular: Negative.   Gastrointestinal: Negative.   Musculoskeletal: Negative.   Skin: Positive for color change. Negative for wound.  Hematological: Negative.   Psychiatric/Behavioral: Negative.     Past Medical History:  Diagnosis Date  . Anxiety   . Arthritis   . Blood dyscrasia    Factor V Leiden  . Cancer ([place])    skin; basal and melanoma  . Depression   . GERD (gastroesophageal reflux disease)    takes tums or rolaids  . Hypertension   . Hypothyroidism   . Occasional tremors   . Reflux   . Thyroid disease     Past Surgical History:  Procedure Laterality Date  . ABDOMINAL HYSTERECTOMY     partial  . BLADDER REPAIR    . CHOLECYSTECTOMY    . INCISIONAL HERNIA REPAIR N/A 07/03/2018   Procedure: LAPAROSCOPIC INCISIONAL HERNIA REPAIR WITH MESH;  Surgeon: Erroll Luna, MD;  Location: [facility];  Service: General;  Laterality: N/A;  . INSERTION OF MESH N/A 05/30/2016   Procedure: INSERTION OF MESH;  Surgeon: Erroll Luna, MD;  Location: [place];  Service: General;  Laterality: N/A;  . KNEE SURGERY Bilateral   . RECTOCELE REPAIR     . SHOULDER ARTHROSCOPY Bilateral   . UMBILICAL HERNIA REPAIR N/A 05/30/2016   Procedure: UMBILICAL HERNIA REPAIR;  Surgeon: Erroll Luna, MD;  Location: [place] [facility];  Service: General;  Laterality: N/A;      Current Outpatient Medications:  .  amLODipine (NORVASC) 5 MG tablet, Take 5 mg by mouth daily., Disp: , Rfl:  .  hydrochlorothiazide (HYDRODIURIL) 25 MG tablet, Take 25 mg by mouth daily., Disp: , Rfl:  .  levothyroxine (SYNTHROID, LEVOTHROID) 25 MCG tablet, Take 25 mcg by mouth daily., Disp: , Rfl: 3 .  methocarbamol (ROBAXIN) 500 MG tablet, Take 1 tablet (500 mg total) by mouth every 6 (six) hours as needed for muscle spasms., Disp: 30 tablet, Rfl: 0 .  Multiple Vitamin (MULTIVITAMIN WITH MINERALS) TABS tablet, Take 1 tablet by mouth daily., Disp: , Rfl:  .  omeprazole (PRILOSEC) 20 MG capsule, Take 20 mg by mouth daily., Disp: , Rfl:  .  PARoxetine (PAXIL) 40 MG tablet, Take 40 mg by mouth daily. , Disp: , Rfl: 1 .  vitamin B-12 (CYANOCOBALAMIN) 1000 MCG tablet, Take 1,000 mcg by mouth daily., Disp: , Rfl:  .  Vitamin D, Ergocalciferol, (DRISDOL) 50000 units CAPS capsule, Take 50,000 Units  by mouth 2 (two) times a week. , Disp: , Rfl: 3   Objective:   Vitals:   01/19/20 1042  BP: (!) 157/87  Pulse: 85  Temp: 98.3 F (36.8 C)  SpO2: 96%    Physical Exam Vitals and nursing note reviewed.  Constitutional:      Appearance: Normal appearance.  HENT:     Head: Normocephalic and atraumatic.   Cardiovascular:     Rate and Rhythm: Normal rate.     Pulses: Normal pulses.  Pulmonary:     Effort: Pulmonary effort is normal.  Abdominal:     General: Abdomen is flat. There is no distension.     Tenderness: There is no abdominal tenderness.  Neurological:     General: No focal deficit present.     Mental Status: She is alert and oriented to person, place, and time.  Psychiatric:        Mood and Affect: Mood normal.        Behavior: Behavior normal.      Assessment & Plan:  Encounter for counseling  We discussed the options for a face and neck lift.  She is in good health but her age does cause me to pause.  I would like her to think about the risks of surgery and discuss with her husband.  We also discussed none surgical options that would be safer.  We can revise her right cheek scar at anytime pending her other decisions.  Quote provided.  [place], DO

## 2020-01-23 NOTE — Progress Notes (Deleted)
Assessment/Plan:   ***1.  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   Subjective:   Elizabeth Mcclain was seen today in follow up for dystonia, status post most recent Botox on August 13.  My previous records as well as any outside records available were reviewed prior to todays visit.  Pt reports that ***.  She also is concerned about her memory.  Living situation:  Pt {LIVES WITH:5711::"lives with their family"}.  The patient does *** do the finances in the home.  The patient *** drive.   There have *** been any motor vehicle accidents in the recent years.  The patient does *** cook.  There is *** difficulty remembering common recipes.  The stovetop has *** been left on accidentally.  ADL's:  The patient is *** able to perform ***own ADL's. The {med prov:18004}.The patients bladder and bowel are *** under good control.   Behavior:   Family and patient report problems with {alzheimer's behavior:18002}. There have been *** behavioral changes over the years.      PREVIOUS MEDICATIONS: {Parkinson's RX:18200}  CURRENT MEDICATIONS:  Outpatient Encounter Medications as of 01/28/2020  Medication Sig  . amLODipine (NORVASC) 5 MG tablet Take 5 mg by mouth daily.  . hydrochlorothiazide (HYDRODIURIL) 25 MG tablet Take 25 mg by mouth daily.  Marland Kitchen levothyroxine (SYNTHROID, LEVOTHROID) 25 MCG tablet Take 25 mcg by mouth daily.  . methocarbamol (ROBAXIN) 500 MG tablet Take 1 tablet (500 mg total) by mouth every 6 (six) hours as needed for muscle spasms.  . Multiple Vitamin (MULTIVITAMIN WITH MINERALS) TABS tablet Take 1 tablet by mouth daily.  Marland Kitchen omeprazole (PRILOSEC) 20 MG capsule Take 20 mg by mouth daily.  Marland Kitchen PARoxetine (PAXIL) 40 MG tablet Take 40 mg by mouth daily.   . vitamin B-12 (CYANOCOBALAMIN) 1000 MCG tablet Take 1,000 mcg by mouth daily.  . Vitamin D, Ergocalciferol,  (DRISDOL) 50000 units CAPS capsule Take 50,000 Units by mouth 2 (two) times a week.    No facility-administered encounter medications on file as of 01/28/2020.     Objective:   PHYSICAL EXAMINATION:    VITALS:  There were no vitals filed for this visit.  GEN:  The patient appears stated age and is in NAD. HEENT:  Normocephalic, atraumatic.  The mucous membranes are moist. The superficial temporal arteries are without ropiness or tenderness. CV:  RRR Lungs:  CTAB Neck/HEME:  There are no carotid bruits bilaterally.  Neurological examination:  Orientation: The patient is alert and oriented x3. Cranial nerves: There is good facial symmetry.The speech is fluent and clear. Soft palate rises symmetrically and there is no tongue deviation. Hearing is intact to conversational tone. Sensation: Sensation is intact to light touch throughout Motor: Strength is at least antigravity x4.  Movement examination: Tone: There is normal tone in the UE/LE Abnormal movements:  no tremor.  No myoclonus.  No asterixis.   Coordination:  There is no decremation with RAM's. Gait and Station: The patient has no difficulty arising out of a deep-seated chair without the use of the hands. The patient's stride length is good.      ***Total time spent on today's visit was *** minutes, including both face-to-face time and nonface-to-face time.  Time included that spent on review of records (prior notes available to me/labs/imaging if pertinent), discussing treatment and goals, answering patient's questions and coordinating care.  Cc:  Angelina Sheriff, MD

## 2020-01-28 ENCOUNTER — Ambulatory Visit: Payer: Medicare Other | Admitting: Neurology

## 2020-01-28 NOTE — Progress Notes (Deleted)
Assessment/Plan:   ***1.  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   Subjective:   Elizabeth Mcclain was seen today in follow up for dystonia, status post most recent Botox on August 13.  My previous records as well as any outside records available were reviewed prior to todays visit.  Pt reports that ***.  She also is concerned about her memory.  Living situation:  Pt {LIVES WITH:5711::"lives with their family"}.  The patient does *** do the finances in the home.  The patient *** drive.   There have *** been any motor vehicle accidents in the recent years.  The patient does *** cook.  There is *** difficulty remembering common recipes.  The stovetop has *** been left on accidentally.  ADL's:  The patient is *** able to perform ***own ADL's. The {med prov:18004}.The patients bladder and bowel are *** under good control.   Behavior:   Family and patient report problems with {alzheimer's behavior:18002}. There have been *** behavioral changes over the years.      PREVIOUS MEDICATIONS: {Parkinson's RX:18200}  CURRENT MEDICATIONS:  Outpatient Encounter Medications as of 01/28/2020  Medication Sig   amLODipine (NORVASC) 5 MG tablet Take 5 mg by mouth daily.   hydrochlorothiazide (HYDRODIURIL) 25 MG tablet Take 25 mg by mouth daily.   levothyroxine (SYNTHROID, LEVOTHROID) 25 MCG tablet Take 25 mcg by mouth daily.   methocarbamol (ROBAXIN) 500 MG tablet Take 1 tablet (500 mg total) by mouth every 6 (six) hours as needed for muscle spasms.   Multiple Vitamin (MULTIVITAMIN WITH MINERALS) TABS tablet Take 1 tablet by mouth daily.   omeprazole (PRILOSEC) 20 MG capsule Take 20 mg by mouth daily.   PARoxetine (PAXIL) 40 MG tablet Take 40 mg by mouth daily.    vitamin B-12 (CYANOCOBALAMIN) 1000 MCG tablet Take 1,000 mcg by mouth daily.   Vitamin D, Ergocalciferol,  (DRISDOL) 50000 units CAPS capsule Take 50,000 Units by mouth 2 (two) times a week.    No facility-administered encounter medications on file as of 01/28/2020.     Objective:   PHYSICAL EXAMINATION:    VITALS:  There were no vitals filed for this visit.  GEN:  The patient appears stated age and is in NAD. HEENT:  Normocephalic, atraumatic.  The mucous membranes are moist. The superficial temporal arteries are without ropiness or tenderness. CV:  RRR Lungs:  CTAB Neck/HEME:  There are no carotid bruits bilaterally.  Neurological examination:  Orientation: The patient is alert and oriented x3. Cranial nerves: There is good facial symmetry.The speech is fluent and clear. Soft palate rises symmetrically and there is no tongue deviation. Hearing is intact to conversational tone. Sensation: Sensation is intact to light touch throughout Motor: Strength is at least antigravity x4.  Movement examination: Tone: There is normal tone in the UE/LE Abnormal movements:  no tremor.  No myoclonus.  No asterixis.   Coordination:  There is no decremation with RAM's. Gait and Station: The patient has no difficulty arising out of a deep-seated chair without the use of the hands. The patient's stride length is good.      ***Total time spent on today's visit was *** minutes, including both face-to-face time and nonface-to-face time.  Time included that spent on review of records (prior notes available to me/labs/imaging if pertinent), discussing treatment and goals, answering patient's questions and coordinating care.  Cc:  Angelina Sheriff, MD

## 2020-01-30 ENCOUNTER — Ambulatory Visit: Payer: Medicare Other | Admitting: Neurology

## 2020-02-10 ENCOUNTER — Telehealth: Payer: Medicare Other | Admitting: Plastic Surgery

## 2020-02-24 ENCOUNTER — Encounter: Payer: Self-pay | Admitting: Neurology

## 2020-02-24 NOTE — Progress Notes (Addendum)
Janece Canterbury Key: Myra Gianotti - [place] Case ID: [id number] Need help? Call us at [phone] Outcome Approvedtoday Request Reference Number: [id number]. BOTOX INJ 100UNIT is approved through 05/26/2020. Your patient may now fill this prescription and it will be covered. Drug Botox 100UNIT solution Form [employer] Medicare Part D Electronic Prior Authorization Form (2017 NCPDP)  Once PA approved need to change SP to [facility] [phone].   10/6- called and set up patient's account and gave prescription for Botox to [facility]; they had to leave patient a vm for consent; awaiting her consent then we can schedule delivery.

## 2020-02-26 NOTE — Progress Notes (Signed)
Assessment/Plan:   1.  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  She does state that she may not continue due to cost (but admits that had facility fee elsewhere).  For now, she looks better than previous and we will slightly increase the dosage to the SCM and [place].  2.  Memory change  -suspect mci, if that  -asked about prevagen.  Don't recommend  -on b12 supplement already  -we will schedule neurocog testing   Subjective:   Elizabeth Mcclain was seen today in follow up for dystonia, status post most recent Botox on August 13.  My previous records as well as any outside records available were reviewed prior to todays visit.  Pt reports that these injections were a little better than the ones she received elsewhere.   She does state that botox is expensive and she isn't sure that she will continue. Still some L jaw tremor.  She also is concerned about her memory.  Living situation:  Pt lives with their spouse.  The patient does  do the finances in the home.  No trouble with that The patient does drive.   There have not been any motor vehicle accidents in the recent years.  The patient does cook.  There is no difficulty remembering common recipes.  The stovetop has not been left on accidentally.  ADL's:  The patient is able to perform her own ADL's. The patient self medicates.The patients bladder and bowel are under good control.   Behavior:   There have been no behavioral changes over the years.    Biggest c/o is word finding trouble   CURRENT MEDICATIONS:  Outpatient Encounter Medications as of 03/02/2020  Medication Sig  . amLODipine (NORVASC) 5 MG tablet Take 5 mg by mouth daily.  . hydrochlorothiazide (HYDRODIURIL) 25 MG tablet Take 25 mg by mouth daily.  Marland Kitchen levothyroxine (SYNTHROID, LEVOTHROID) 25 MCG tablet Take 25 mcg by mouth daily.  . Multiple Vitamin  (MULTIVITAMIN WITH MINERALS) TABS tablet Take 1 tablet by mouth daily.  Marland Kitchen omeprazole (PRILOSEC) 20 MG capsule Take 20 mg by mouth daily.  Marland Kitchen PARoxetine (PAXIL) 40 MG tablet Take 40 mg by mouth daily.   . vitamin B-12 (CYANOCOBALAMIN) 1000 MCG tablet Take 1,000 mcg by mouth daily.  . Vitamin D, Ergocalciferol, (DRISDOL) 50000 units CAPS capsule Take 50,000 Units by mouth 2 (two) times a week.   . methocarbamol (ROBAXIN) 500 MG tablet Take 1 tablet (500 mg total) by mouth every 6 (six) hours as needed for muscle spasms. (Patient not taking: Reported on 03/02/2020)  . [DISCONTINUED] alendronate (FOSAMAX) 70 MG tablet Take 70 mg by mouth once a week. (Patient not taking: Reported on 03/02/2020)   No facility-administered encounter medications on file as of 03/02/2020.     Objective:   PHYSICAL EXAMINATION:    VITALS:   Vitals:   03/02/20 1310  BP: 137/64  Pulse: 96  Resp: 18  SpO2: 98%  Weight: 171 lb (77.6 kg)  Height: 5' (1.524 m)    GEN:  The patient appears stated age and is in NAD. HEENT:  Normocephalic, atraumatic.  The mucous membranes are moist. The superficial temporal arteries are without ropiness or tenderness. CV:  RRR Lungs:  CTAB Neck/HEME:  There are no carotid bruits bilaterally.  Very minimal head treor  Neurological examination:  Orientation: The patient is alert and  oriented x3. Cranial nerves: There is good facial symmetry.The speech is fluent and clear. Soft palate rises symmetrically and there is no tongue deviation. Hearing is intact to conversational tone. Sensation: Sensation is intact to light touch throughout Motor: Strength is at least antigravity x4.  Movement examination: Tone: There is normal tone in the UE/LE Abnormal movements:  no tremor.  No myoclonus.  No asterixis.   Coordination:  There is no decremation with RAM's.     Total time spent on today's visit was 30 minutes, including both face-to-face time and nonface-to-face time.  Time  included that spent on review of records (prior notes available to me/labs/imaging if pertinent), discussing treatment and goals, answering patient's questions and coordinating care.  Cc:  Angelina Sheriff, MD

## 2020-03-02 ENCOUNTER — Encounter: Payer: Self-pay | Admitting: Neurology

## 2020-03-02 ENCOUNTER — Ambulatory Visit: Payer: Medicare Other | Admitting: Neurology

## 2020-03-02 ENCOUNTER — Other Ambulatory Visit: Payer: Self-pay

## 2020-03-02 VITALS — BP 137/64 | HR 96 | Resp 18 | Ht 60.0 in | Wt 171.0 lb

## 2020-03-02 DIAGNOSIS — G244 Idiopathic orofacial dystonia: Secondary | ICD-10-CM

## 2020-03-02 DIAGNOSIS — R413 Other amnesia: Secondary | ICD-10-CM

## 2020-03-02 DIAGNOSIS — G243 Spasmodic torticollis: Secondary | ICD-10-CM | POA: Diagnosis not present

## 2020-03-02 DIAGNOSIS — K432 Incisional hernia without obstruction or gangrene: Secondary | ICD-10-CM | POA: Diagnosis not present

## 2020-03-02 NOTE — Patient Instructions (Signed)
You have been referred for a neurocognitive evaluation in our office.   The evaluation has two parts.   . The first part of the evaluation is a clinical interview with the neuropsychologist (Dr. Melvyn Novas or Dr. Nicole Kindred). Please bring someone with you to this appointment if possible, as it is helpful for the doctor to hear from both you and another adult who knows you well.   . The second part of the evaluation is testing with the doctor's technician Hinton Dyer or Maudie Mercury). The testing includes a variety of tasks- mostly question-and-answer, some paper-and-pencil. There is nothing you need to do to prepare for this appointment, but having a good night's sleep prior to the testing, taking medications as you normally would, and bringing eyeglasses and hearing aids (if you wear them), is advised. Please make sure that you wear a mask to the appointment.  Please note: We have to reserve several hours of the neuropsychologist's time and the psychometrician's time for your evaluation appointment. As such, please note that there is a No-Show fee of $100. If you are unable to attend any of your appointments, please contact our office as soon as possible to reschedule.

## 2020-03-19 NOTE — Progress Notes (Signed)
10/29- called [facility] to check status of order. They are waiting on patient to give patient assistance information so they can set up account and get consent for delivery.  Also, called both patient numbers and left a message with info to call them.

## 2020-03-26 NOTE — Progress Notes (Signed)
11/5- patient unable to get co-pay assistance for Botox because of insurance. Going to cost patient 140$ more by going through SP. Spoke with Hoyle Sauer and Dr. Carles Collet- OK to use buy and bill for this 11/12 appt and we will see what the cost difference for [place] is. And go from there regarding SP or buy and bill.

## 2020-04-02 ENCOUNTER — Ambulatory Visit (INDEPENDENT_AMBULATORY_CARE_PROVIDER_SITE_OTHER): Payer: Medicare Other | Admitting: Neurology

## 2020-04-02 ENCOUNTER — Other Ambulatory Visit: Payer: Self-pay

## 2020-04-02 DIAGNOSIS — G243 Spasmodic torticollis: Secondary | ICD-10-CM | POA: Diagnosis not present

## 2020-04-02 DIAGNOSIS — G244 Idiopathic orofacial dystonia: Secondary | ICD-10-CM | POA: Diagnosis not present

## 2020-04-02 MED ORDER — ONABOTULINUMTOXINA 100 UNITS IJ SOLR
300.0000 [IU] | Freq: Once | INTRAMUSCULAR | Status: AC
  Administered 2020-04-02: 300 [IU] via INTRAMUSCULAR

## 2020-04-02 NOTE — Procedures (Signed)
[facility]   Procedure Note Botox  Attending: Dr. Wells Guiles Morgann Woodburn  Preoperative Diagnosis(es): Cervical Dystonia; oromandibular dystonia Results hx:  Notes some improvement after last time but not enough  Consent obtained from: The patient Benefits discussed included, but were not limited to decreased muscle tightness, increased joint range of motion, and decreased pain.  Risk discussed included, but were not limited pain and discomfort, bleeding, bruising, excessive weakness, venous thrombosis, muscle atrophy and dysphagia.  A copy of the patient medication guide was given to the patient which explains the blackbox warning.  Patients identity and treatment sites confirmed Yes.  .  Details of Procedure: Skin was cleaned with alcohol.  A 30 gauge, 60mm  needle was introduced to the target muscle, except for posterior splenius where 27 gauge, 1.5 inch needle used.   Prior to injection, the needle plunger was aspirated to make sure the needle was not within a blood vessel.  There was no blood retrieved on aspiration.    Following is a summary of the muscles injected  And the amount of Botulinum toxin used:   Dilution 0.9% preservative free saline mixed with 100 u Botox type A to make 10 U per 0.1cc  Injections  Location Left  Right Units Number of sites        Sternocleidomastoid 60/20  80 2  Splenius Capitus, posterior approach  100 100 1  Splenius Capitus, lateral approach  30 30 1   Levator Scapulae      Trapezius      masseter 20 20 40 1 each side  temporalis 10 10 20 1  each side  TOTAL UNITS:   270    Agent: Botulinum Type A ( Onobotulinum Toxin type A ).  3 vials of Botox were used, each containing 100 units and freshly diluted with 1 mL of sterile, non-preserved saline   Total injected (Units): 270  Total wasted (Units): 30   Pt tolerated procedure well without complications.   Reinjection is anticipated in 3 months.

## 2020-04-21 ENCOUNTER — Encounter: Payer: Medicare Other | Admitting: Counselor

## 2020-04-28 ENCOUNTER — Encounter: Payer: Medicare Other | Admitting: Counselor

## 2020-05-26 NOTE — Progress Notes (Deleted)
   Assessment/Plan:   ***1.  Cervical dystonia and oromandibular dystonia  -Last injections were November 12.  Patient reports that ***  2.  Memory loss  -Patient had neurocognitive testing scheduled, but canceled that.  She can call back to reschedule if she would like to at her convenience.  Subjective:   Elizabeth Mcclain was seen today in follow up for cervical dystonia.  My previous records as well as any outside records available were reviewed prior to todays visit.  Patient was last injection with Botox on November 12.  We did increase the dose slightly.  She reports that ***.  When she was here last visit, she was complaining about memory loss.  Schedule neurocognitive testing.  The patient ultimately canceled that.  PREVIOUS MEDICATIONS: {Parkinson's RX:18200}  CURRENT MEDICATIONS:  Outpatient Encounter Medications as of 05/31/2020  Medication Sig  . amLODipine (NORVASC) 5 MG tablet Take 5 mg by mouth daily.  . hydrochlorothiazide (HYDRODIURIL) 25 MG tablet Take 25 mg by mouth daily.  Marland Kitchen levothyroxine (SYNTHROID, LEVOTHROID) 25 MCG tablet Take 25 mcg by mouth daily.  . methocarbamol (ROBAXIN) 500 MG tablet Take 1 tablet (500 mg total) by mouth every 6 (six) hours as needed for muscle spasms. (Patient not taking: Reported on 03/02/2020)  . Multiple Vitamin (MULTIVITAMIN WITH MINERALS) TABS tablet Take 1 tablet by mouth daily.  Marland Kitchen omeprazole (PRILOSEC) 20 MG capsule Take 20 mg by mouth daily.  Marland Kitchen PARoxetine (PAXIL) 40 MG tablet Take 40 mg by mouth daily.   . vitamin B-12 (CYANOCOBALAMIN) 1000 MCG tablet Take 1,000 mcg by mouth daily.  . Vitamin D, Ergocalciferol, (DRISDOL) 50000 units CAPS capsule Take 50,000 Units by mouth 2 (two) times a week.    No facility-administered encounter medications on file as of 05/31/2020.     Objective:   PHYSICAL EXAMINATION:    VITALS:  There were no vitals filed for this visit.  GEN:  The patient appears stated age and is in NAD. HEENT:   Normocephalic, atraumatic.  The mucous membranes are moist. The superficial temporal arteries are without ropiness or tenderness. CV:  RRR Lungs:  CTAB Neck/HEME:  There are no carotid bruits bilaterally.  Neurological examination:  Orientation: The patient is alert and oriented x3. Cranial nerves: There is good facial symmetry.The speech is fluent and clear. Soft palate rises symmetrically and there is no tongue deviation. Hearing is intact to conversational tone. Sensation: Sensation is intact to light touch throughout Motor: Strength is at least antigravity x4.  Movement examination: Tone: There is normal tone in the UE/LE Abnormal movements:  no tremor.  No myoclonus.  No asterixis.   Coordination:  There is no decremation with RAM's. Gait and Station: The patient has no difficulty arising out of a deep-seated chair without the use of the hands. The patient's stride length is good.      ***Total time spent on today's visit was *** minutes, including both face-to-face time and nonface-to-face time.  Time included that spent on review of records (prior notes available to me/labs/imaging if pertinent), discussing treatment and goals, answering patient's questions and coordinating care.  Cc:  Noni Saupe, MD

## 2020-05-31 ENCOUNTER — Ambulatory Visit: Payer: Medicare Other | Admitting: Neurology

## 2020-06-11 ENCOUNTER — Encounter: Payer: Self-pay | Admitting: Neurology

## 2020-06-11 NOTE — Progress Notes (Signed)
Janece Canterbury Key: [facility] - [place] Case ID: [id number] Need help? Call us at [phone] Outcome Approvedtoday Request Reference Number: [id number]. BOTOX INJ 100UNIT is approved through 09/09/2020. Your patient may now fill this prescription and it will be covered. Drug Botox 100UNIT solution Form [employer] Medicare Part D Electronic Prior Authorization Form (2017 NCPDP)

## 2020-07-02 ENCOUNTER — Ambulatory Visit: Payer: Medicare Other | Admitting: Neurology

## 2020-07-12 DIAGNOSIS — Z9119 Patient's noncompliance with other medical treatment and regimen: Secondary | ICD-10-CM | POA: Diagnosis not present

## 2020-07-12 DIAGNOSIS — E559 Vitamin D deficiency, unspecified: Secondary | ICD-10-CM | POA: Diagnosis not present

## 2020-07-12 DIAGNOSIS — E039 Hypothyroidism, unspecified: Secondary | ICD-10-CM | POA: Diagnosis not present

## 2020-07-12 DIAGNOSIS — I10 Essential (primary) hypertension: Secondary | ICD-10-CM | POA: Diagnosis not present

## 2020-07-15 DIAGNOSIS — Z79899 Other long term (current) drug therapy: Secondary | ICD-10-CM | POA: Diagnosis not present

## 2020-07-15 DIAGNOSIS — R0602 Shortness of breath: Secondary | ICD-10-CM | POA: Diagnosis not present

## 2020-07-15 DIAGNOSIS — R0789 Other chest pain: Secondary | ICD-10-CM | POA: Diagnosis not present

## 2020-07-15 DIAGNOSIS — R002 Palpitations: Secondary | ICD-10-CM | POA: Diagnosis not present

## 2020-07-15 DIAGNOSIS — I10 Essential (primary) hypertension: Secondary | ICD-10-CM | POA: Diagnosis not present

## 2020-07-15 DIAGNOSIS — R110 Nausea: Secondary | ICD-10-CM | POA: Diagnosis not present

## 2020-07-16 DIAGNOSIS — K219 Gastro-esophageal reflux disease without esophagitis: Secondary | ICD-10-CM | POA: Insufficient documentation

## 2020-07-16 DIAGNOSIS — I159 Secondary hypertension, unspecified: Secondary | ICD-10-CM

## 2020-07-16 DIAGNOSIS — F32A Depression, unspecified: Secondary | ICD-10-CM | POA: Insufficient documentation

## 2020-07-16 DIAGNOSIS — E039 Hypothyroidism, unspecified: Secondary | ICD-10-CM | POA: Insufficient documentation

## 2020-07-16 DIAGNOSIS — E079 Disorder of thyroid, unspecified: Secondary | ICD-10-CM | POA: Insufficient documentation

## 2020-07-16 DIAGNOSIS — R251 Tremor, unspecified: Secondary | ICD-10-CM | POA: Insufficient documentation

## 2020-07-16 DIAGNOSIS — F419 Anxiety disorder, unspecified: Secondary | ICD-10-CM | POA: Insufficient documentation

## 2020-07-16 DIAGNOSIS — D759 Disease of blood and blood-forming organs, unspecified: Secondary | ICD-10-CM | POA: Insufficient documentation

## 2020-07-16 DIAGNOSIS — C801 Malignant (primary) neoplasm, unspecified: Secondary | ICD-10-CM

## 2020-07-16 DIAGNOSIS — I10 Essential (primary) hypertension: Secondary | ICD-10-CM | POA: Insufficient documentation

## 2020-07-16 DIAGNOSIS — M1990 Unspecified osteoarthritis, unspecified site: Secondary | ICD-10-CM

## 2020-07-19 ENCOUNTER — Ambulatory Visit (INDEPENDENT_AMBULATORY_CARE_PROVIDER_SITE_OTHER): Payer: Medicare Other

## 2020-07-19 ENCOUNTER — Encounter: Payer: Self-pay | Admitting: Cardiology

## 2020-07-19 ENCOUNTER — Other Ambulatory Visit: Payer: Self-pay

## 2020-07-19 ENCOUNTER — Ambulatory Visit: Payer: Medicare Other | Admitting: Cardiology

## 2020-07-19 VITALS — BP 130/72 | HR 87 | Ht 60.0 in | Wt 171.4 lb

## 2020-07-19 DIAGNOSIS — R0789 Other chest pain: Secondary | ICD-10-CM | POA: Insufficient documentation

## 2020-07-19 DIAGNOSIS — R9431 Abnormal electrocardiogram [ECG] [EKG]: Secondary | ICD-10-CM | POA: Diagnosis not present

## 2020-07-19 DIAGNOSIS — R002 Palpitations: Secondary | ICD-10-CM | POA: Insufficient documentation

## 2020-07-19 DIAGNOSIS — R079 Chest pain, unspecified: Secondary | ICD-10-CM

## 2020-07-19 DIAGNOSIS — I493 Ventricular premature depolarization: Secondary | ICD-10-CM | POA: Diagnosis not present

## 2020-07-19 DIAGNOSIS — Z862 Personal history of diseases of the blood and blood-forming organs and certain disorders involving the immune mechanism: Secondary | ICD-10-CM | POA: Diagnosis not present

## 2020-07-19 DIAGNOSIS — E669 Obesity, unspecified: Secondary | ICD-10-CM

## 2020-07-19 MED ORDER — METOPROLOL TARTRATE 100 MG PO TABS: ORAL_TABLET | ORAL | 0 refills | Status: DC

## 2020-07-19 NOTE — Progress Notes (Signed)
Cardiology Office Note:    Date:  07/19/2020   ID:  MACKENZE GRANDISON, DOB 1945/11/21, MRN [id number]  PCP:  Angelina Sheriff, MD  Cardiologist:  Berniece Salines, DO  Electrophysiologist:  None   Referring MD: Angelina Sheriff, MD   I have been having some chest pressure  History of Present Illness:    Elizabeth Mcclain is a 75 y.o. female with a hx of hypertension, hypothyroidism is here today to be evaluated for chest pressure and palpitations. Patient tells me that over the last several months she has had intermittent chest pressure. She described as a pressure-like sensation who comes midsternum and then diffusely go up to her neck. Had any shortness of breath any lightheadedness and dizziness. She also noticed that she has had multiple palpitations. Recently she went to her PCP when she experiencing that she had a EKG which was abnormal which she was then sent to the emergency department. Emergency department she had another EKG which was also abnormal with concern for anterior infarction of age-indeterminate.  She is concerned because her father did have artery bypass grafting although not prematurely.  Past Medical History:  Diagnosis Date  . Anxiety   . Arthritis   . Blood dyscrasia    Factor V Leiden  . Cancer ([place])    skin; basal and melanoma  . Depression   . GERD (gastroesophageal reflux disease)    takes tums or rolaids  . Hypertension   . Hypothyroidism   . Occasional tremors   . Reflux   . Thyroid disease     Past Surgical History:  Procedure Laterality Date  . ABDOMINAL HYSTERECTOMY     partial  . BLADDER REPAIR    . CHOLECYSTECTOMY    . INCISIONAL HERNIA REPAIR N/A 07/03/2018   Procedure: LAPAROSCOPIC INCISIONAL HERNIA REPAIR WITH MESH;  Surgeon: Erroll Luna, MD;  Location: [facility];  Service: General;  Laterality: N/A;  . INSERTION OF MESH N/A 05/30/2016   Procedure: INSERTION OF MESH;  Surgeon: Erroll Luna, MD;  Location: [facility];   Service: General;  Laterality: N/A;  . KNEE SURGERY Bilateral   . RECTOCELE REPAIR    . SHOULDER ARTHROSCOPY Bilateral   . UMBILICAL HERNIA REPAIR N/A 05/30/2016   Procedure: UMBILICAL HERNIA REPAIR;  Surgeon: Erroll Luna, MD;  Location: [facility];  Service: General;  Laterality: N/A;    Current Medications: Current Meds  Medication Sig  . amLODipine (NORVASC) 5 MG tablet Take 5 mg by mouth daily.  . hydrochlorothiazide (HYDRODIURIL) 25 MG tablet Take 25 mg by mouth daily.  Marland Kitchen levothyroxine (SYNTHROID, LEVOTHROID) 25 MCG tablet Take 25 mcg by mouth daily.  Marland Kitchen losartan (COZAAR) 25 MG tablet Take 25 mg by mouth daily.  . metoprolol tartrate (LOPRESSOR) 100 MG tablet Take two hours before CT  . Multiple Vitamin (MULTIVITAMIN WITH MINERALS) TABS tablet Take 1 tablet by mouth daily.  Marland Kitchen omeprazole (PRILOSEC) 20 MG capsule Take 20 mg by mouth daily.  Marland Kitchen PARoxetine (PAXIL) 40 MG tablet Take 40 mg by mouth daily.   . vitamin B-12 (CYANOCOBALAMIN) 1000 MCG tablet Take 1,000 mcg by mouth daily.  . Vitamin D, Ergocalciferol, (DRISDOL) 50000 units CAPS capsule Take 50,000 Units by mouth 2 (two) times a week.      Allergies:   Felodipine, Norvasc [amlodipine], Oxycodone-acetaminophen, Penicillins, Percocet [oxycodone-acetaminophen], and Venlafaxine   Social History   Socioeconomic History  . Marital status: Married    Spouse name: Not  on file  . Number of children: Not on file  . Years of education: Not on file  . Highest education level: Not on file  Occupational History  . Not on file  Tobacco Use  . Smoking status: Never Smoker  . Smokeless tobacco: Never Used  Vaping Use  . Vaping Use: Never used  Substance and Sexual Activity  . Alcohol use: No    Alcohol/week: 0.0 standard drinks  . Drug use: No  . Sexual activity: Not on file  Other Topics Concern  . Not on file  Social History Narrative   Right handed   One story home   Drinks caffeine   Social Determinants  of Health   Financial Resource Strain: Not on file  Food Insecurity: Not on file  Transportation Needs: Not on file  Physical Activity: Not on file  Stress: Not on file  Social Connections: Not on file     Family History: The patient's family history includes Breast cancer in her mother; Leukemia in her brother and mother; Lung cancer in her father.  ROS:   Review of Systems  Constitution: Negative for decreased appetite, fever and weight gain.  HENT: Negative for congestion, ear discharge, hoarse voice and sore throat.   Eyes: Negative for discharge, redness, vision loss in right eye and visual halos.  Cardiovascular: Negative for chest pain, dyspnea on exertion, leg swelling, orthopnea and palpitations.  Respiratory: Negative for cough, hemoptysis, shortness of breath and snoring.   Endocrine: Negative for heat intolerance and polyphagia.  Hematologic/Lymphatic: Negative for bleeding problem. Does not bruise/bleed easily.  Skin: Negative for flushing, nail changes, rash and suspicious lesions.  Musculoskeletal: Negative for arthritis, joint pain, muscle cramps, myalgias, neck pain and stiffness.  Gastrointestinal: Negative for abdominal pain, bowel incontinence, diarrhea and excessive appetite.  Genitourinary: Negative for decreased libido, genital sores and incomplete emptying.  Neurological: Negative for brief paralysis, focal weakness, headaches and loss of balance.  Psychiatric/Behavioral: Negative for altered mental status, depression and suicidal ideas.  Allergic/Immunologic: Negative for HIV exposure and persistent infections.    EKGs/Labs/Other Studies Reviewed:    The following studies were reviewed today:   EKG:  The ekg ordered today demonstrates sinus rhythm, heart rate 87 bpm with poor R wave progression suggestive of anterior wall infarction of age-indeterminate.  Chest x-ray done on July 15, 2018 [facility] showed no active disease.  Recent  Labs: No results found for requested labs within last 8760 hours.  Recent Lipid Panel No results found for: CHOL, TRIG, HDL, CHOLHDL, VLDL, LDLCALC, LDLDIRECT  Physical Exam:    VS:  BP 130/72 (BP Location: Right Arm)   Pulse 87   Ht 5' (1.524 m)   Wt 171 lb 6.4 oz (77.7 kg)   SpO2 96%   BMI 33.47 kg/m     Wt Readings from Last 3 Encounters:  07/19/20 171 lb 6.4 oz (77.7 kg)  03/02/20 171 lb (77.6 kg)  01/19/20 169 lb 6.4 oz (76.8 kg)     GEN: Well nourished, well developed in no acute distress HEENT: Normal NECK: No JVD; No carotid bruits LYMPHATICS: No lymphadenopathy CARDIAC: S1S2 noted,RRR, no murmurs, rubs, gallops RESPIRATORY:  Clear to auscultation without rales, wheezing or rhonchi  ABDOMEN: Soft, non-tender, non-distended, +bowel sounds, no guarding. EXTREMITIES: No edema, No cyanosis, no clubbing MUSCULOSKELETAL:  No deformity  SKIN: Warm and dry NEUROLOGIC:  Alert and oriented x 3, non-focal PSYCHIATRIC:  Normal affect, good insight  ASSESSMENT:    1.  Chest pressure   2. PVC (premature ventricular contraction)   3. Nonspecific abnormal electrocardiogram (ECG) (EKG)   4. Palpitations   5. Obesity (BMI 30-39.9)   6. History of factor V Leiden mutation   7. Chest pain, unspecified type    PLAN:     Her chest pressure and abnormal EKG is concerning patient does have intermediate risk for coronary artery disease would not like to do is pursue an ischemic evaluation in this patient.  Shared decision a coronary CTA at this time is appropriate.  I have discussed with the patient about the testing.  The patient has no IV contrast allergy and is agreeable to proceed with this test.  I would like to rule out a cardiovascular etiology of this palpitation, therefore at this time I would like to placed a zio patch for seven days.  Her blood pressure deceptively in the office today no changes will be made to her antihypertensive regimen.   The patient understands the  need to lose weight with diet and exercise. We have discussed specific strategies for this.   The patient is in agreement with the above plan. The patient left the office in stable condition.  The patient will follow up in 3 months or sooner if needed   Medication Adjustments/Labs and Tests Ordered: Current medicines are reviewed at length with the patient today.  Concerns regarding medicines are outlined above.  Orders Placed This Encounter  Procedures  . CT CORONARY MORPH W/CTA COR W/SCORE W/CA W/CM &/OR WO/CM  . CT CORONARY FRACTIONAL FLOW RESERVE DATA PREP  . CT CORONARY FRACTIONAL FLOW RESERVE FLUID ANALYSIS  . Basic metabolic panel  . Magnesium  . Ambulatory referral to Hematology / Oncology  . LONG TERM MONITOR (3-14 DAYS)  . EKG 12-Lead   Meds ordered this encounter  Medications  . metoprolol tartrate (LOPRESSOR) 100 MG tablet    Sig: Take two hours before CT    Dispense:  1 tablet    Refill:  0    Patient Instructions  Medication Instructions:  Your physician recommends that you continue on your current medications as directed. Please refer to the Current Medication list given to you today.  *If you need a refill on your cardiac medications before your next appointment, please call your pharmacy*   Lab Work: Your physician recommends that you return for lab work: 3-7 days before CT: BMET, Mag If you have labs (blood work) drawn today and your tests are completely normal, you will receive your results only by: Marland Kitchen MyChart Message (if you have MyChart) OR . A paper copy in the mail If you have any lab test that is abnormal or we need to change your treatment, we will call you to review the results.   Testing/Procedures: Your cardiac CT will be scheduled at:  [facility] [address] [place], [place] [address] [phone]   If scheduled at [facility], please arrive at the [facility] main entrance (entrance A) of [facility] 30  minutes prior to test start time. Proceed to the [facility] Radiology Department (first floor) to check-in and test prep.  Please follow these instructions carefully (unless otherwise directed):  On the Night Before the Test: . Be sure to Drink plenty of water. . Do not consume any caffeinated/decaffeinated beverages or chocolate 12 hours prior to your test. . Do not take any antihistamines 12 hours prior to your test.  On the Day of the Test: . Drink  plenty of water until 1 hour prior to the test. . Do not eat any food 4 hours prior to the test. . You may take your regular medications prior to the test.  . Take metoprolol (Lopressor) two hours prior to test. . HOLD Furosemide/Hydrochlorothiazide morning of the test. . FEMALES- please wear underwire-free bra if available       After the Test: . Drink plenty of water. . After receiving IV contrast, you may experience a mild flushed feeling. This is normal. . On occasion, you may experience a mild rash up to 24 hours after the test. This is not dangerous. If this occurs, you can take Benadryl 25 mg and increase your fluid intake. . If you experience trouble breathing, this can be serious. If it is severe call 911 IMMEDIATELY. If it is mild, please call our office. . If you take any of these medications: Glipizide/Metformin, Avandament, Glucavance, please do not take 48 hours after completing test unless otherwise instructed.   Once we have confirmed authorization from your insurance company, we will call you to set up a date and time for your test. Based on how quickly your insurance processes prior authorizations requests, please allow up to 4 weeks to be contacted for scheduling your Cardiac CT appointment. Be advised that routine Cardiac CT appointments could be scheduled as many as 8 weeks after your provider has ordered it.  For non-scheduling related questions, please contact the cardiac imaging nurse navigator should you have any  questions/concerns: Marchia Bond, Cardiac Imaging Nurse Navigator Gordy Clement, Cardiac Imaging Nurse Navigator [facility] Direct Office Dial: [phone]   For scheduling needs, including cancellations and rescheduling, please call [place], [phone].  A zio monitor was ordered today. It will remain on for 7 days. You will then return monitor and event diary in provided box. It takes 1-2 weeks for report to be downloaded and returned to [place]. We will call you with the results. If monitor falls off or has orange flashing light, please call Zio for further instructions.    Follow-Up: At [facility], you and your health needs are our priority.  As part of our continuing mission to provide you with exceptional heart care, we have created designated Provider Care Teams.  These Care Teams include your primary Cardiologist (physician) and Advanced Practice Providers (APPs -  Physician Assistants and Nurse Practitioners) who all work together to provide you with the care you need, when you need it.  We recommend signing up for the patient portal called "MyChart".  Sign up information is provided on this After Visit Summary.  MyChart is used to connect with patients for Virtual Visits (Telemedicine).  Patients are able to view lab/test results, encounter notes, upcoming appointments, etc.  Non-urgent messages can be sent to your provider as well.   To learn more about what you can do with MyChart, go to [email or web address].    Your next appointment:   3 month(s)  The format for your next appointment:   In Person  Provider:   Berniece Salines, DO   Other Instructions      Adopting a Healthy Lifestyle.  Know what a healthy weight is for you (roughly BMI <25) and aim to maintain this   Aim for 7+ servings of fruits and vegetables daily   65-80+ fluid ounces of water or unsweet tea for healthy kidneys   Limit to max 1 drink of alcohol per day; avoid  smoking/tobacco   Limit animal fats  in diet for cholesterol and heart health - choose grass fed whenever available   Avoid highly processed foods, and foods high in saturated/trans fats   Aim for low stress - take time to unwind and care for your mental health   Aim for 150 min of moderate intensity exercise weekly for heart health, and weights twice weekly for bone health   Aim for 7-9 hours of sleep daily   When it comes to diets, agreement about the perfect plan isnt easy to find, even among the experts. Experts at the [employer] developed an idea known as the Healthy Eating Plate. Just imagine a plate divided into logical, healthy portions.   The emphasis is on diet quality:   Load up on vegetables and fruits - one-half of your plate: Aim for color and variety, and remember that potatoes dont count.   Go for whole grains - one-quarter of your plate: Whole wheat, barley, wheat berries, quinoa, oats, brown rice, and foods made with them. If you want pasta, go with whole wheat pasta.   Protein power - one-quarter of your plate: Fish, chicken, beans, and nuts are all healthy, versatile protein sources. Limit red meat.   The diet, however, does go beyond the plate, offering a few other suggestions.   Use healthy plant oils, such as olive, canola, soy, corn, sunflower and peanut. Check the labels, and avoid partially hydrogenated oil, which have unhealthy trans fats.   If youre thirsty, drink water. Coffee and tea are good in moderation, but skip sugary drinks and limit milk and dairy products to one or two daily servings.   The type of carbohydrate in the diet is more important than the amount. Some sources of carbohydrates, such as vegetables, fruits, whole grains, and beans-are healthier than others.   Finally, stay active  Signed, Berniece Salines, DO  07/19/2020 12:47 PM    [place]

## 2020-07-19 NOTE — Patient Instructions (Addendum)
Medication Instructions:  Your physician recommends that you continue on your current medications as directed. Please refer to the Current Medication list given to you today.  *If you need a refill on your cardiac medications before your next appointment, please call your pharmacy*   Lab Work: Your physician recommends that you return for lab work: 3-7 days before CT: BMET, Mag If you have labs (blood work) drawn today and your tests are completely normal, you will receive your results only by: Marland Kitchen MyChart Message (if you have MyChart) OR . A paper copy in the mail If you have any lab test that is abnormal or we need to change your treatment, we will call you to review the results.   Testing/Procedures: Your cardiac CT will be scheduled at:  [facility] [address] [place], [place] [address] [phone]   If scheduled at [facility], please arrive at the [facility] main entrance (entrance A) of [facility] 30 minutes prior to test start time. Proceed to the [facility] Radiology Department (first floor) to check-in and test prep.  Please follow these instructions carefully (unless otherwise directed):  On the Night Before the Test: . Be sure to Drink plenty of water. . Do not consume any caffeinated/decaffeinated beverages or chocolate 12 hours prior to your test. . Do not take any antihistamines 12 hours prior to your test.  On the Day of the Test: . Drink plenty of water until 1 hour prior to the test. . Do not eat any food 4 hours prior to the test. . You may take your regular medications prior to the test.  . Take metoprolol (Lopressor) two hours prior to test. . HOLD Furosemide/Hydrochlorothiazide morning of the test. . FEMALES- please wear underwire-free bra if available       After the Test: . Drink plenty of water. . After receiving IV contrast, you may experience a mild flushed feeling. This is normal. . On occasion, you may  experience a mild rash up to 24 hours after the test. This is not dangerous. If this occurs, you can take Benadryl 25 mg and increase your fluid intake. . If you experience trouble breathing, this can be serious. If it is severe call 911 IMMEDIATELY. If it is mild, please call our office. . If you take any of these medications: Glipizide/Metformin, Avandament, Glucavance, please do not take 48 hours after completing test unless otherwise instructed.   Once we have confirmed authorization from your insurance company, we will call you to set up a date and time for your test. Based on how quickly your insurance processes prior authorizations requests, please allow up to 4 weeks to be contacted for scheduling your Cardiac CT appointment. Be advised that routine Cardiac CT appointments could be scheduled as many as 8 weeks after your provider has ordered it.  For non-scheduling related questions, please contact the cardiac imaging nurse navigator should you have any questions/concerns: Marchia Bond, Cardiac Imaging Nurse Navigator Gordy Clement, Cardiac Imaging Nurse Navigator [facility] Direct Office Dial: [phone]   For scheduling needs, including cancellations and rescheduling, please call [place], [phone].  A zio monitor was ordered today. It will remain on for 7 days. You will then return monitor and event diary in provided box. It takes 1-2 weeks for report to be downloaded and returned to [place]. We will call you with the results. If monitor falls off or has orange flashing light, please call Zio for  further instructions.    Follow-Up: At [facility], you and your health needs are our priority.  As part of our continuing mission to provide you with exceptional heart care, we have created designated Provider Care Teams.  These Care Teams include your primary Cardiologist (physician) and Advanced Practice Providers (APPs -  Physician Assistants and Nurse  Practitioners) who all work together to provide you with the care you need, when you need it.  We recommend signing up for the patient portal called "MyChart".  Sign up information is provided on this After Visit Summary.  MyChart is used to connect with patients for Virtual Visits (Telemedicine).  Patients are able to view lab/test results, encounter notes, upcoming appointments, etc.  Non-urgent messages can be sent to your provider as well.   To learn more about what you can do with MyChart, go to [email or web address].    Your next appointment:   3 month(s)  The format for your next appointment:   In Person  Provider:   Berniece Salines, DO   Other Instructions

## 2020-07-21 ENCOUNTER — Telehealth: Payer: Self-pay

## 2020-07-21 NOTE — Telephone Encounter (Signed)
Spoke with patient about CT scan procedure. She had questions about blood work and her referral to hematology. Patient states all of her questions were answered.

## 2020-07-23 DIAGNOSIS — R9431 Abnormal electrocardiogram [ECG] [EKG]: Secondary | ICD-10-CM | POA: Diagnosis not present

## 2020-07-23 DIAGNOSIS — R002 Palpitations: Secondary | ICD-10-CM | POA: Diagnosis not present

## 2020-07-23 DIAGNOSIS — R0789 Other chest pain: Secondary | ICD-10-CM | POA: Diagnosis not present

## 2020-07-23 DIAGNOSIS — I493 Ventricular premature depolarization: Secondary | ICD-10-CM | POA: Diagnosis not present

## 2020-07-23 LAB — BASIC METABOLIC PANEL
BUN/Creatinine Ratio: 30 — ABNORMAL HIGH (ref 12–28)
BUN: 23 mg/dL (ref 8–27)
CO2: 25 mmol/L (ref 20–29)
Calcium: 9.8 mg/dL (ref 8.7–10.3)
Chloride: 103 mmol/L (ref 96–106)
Creatinine, Ser: 0.76 mg/dL (ref 0.57–1.00)
Glucose: 101 mg/dL — ABNORMAL HIGH (ref 65–99)
Potassium: 3.9 mmol/L (ref 3.5–5.2)
Sodium: 141 mmol/L (ref 134–144)
eGFR: 83 mL/min/{1.73_m2} (ref >59)

## 2020-07-23 LAB — MAGNESIUM: Magnesium: 1.9 mg/dL (ref 1.6–2.3)

## 2020-07-26 ENCOUNTER — Telehealth (HOSPITAL_COMMUNITY): Payer: Self-pay | Admitting: Emergency Medicine

## 2020-07-26 DIAGNOSIS — I493 Ventricular premature depolarization: Secondary | ICD-10-CM

## 2020-07-26 NOTE — Telephone Encounter (Signed)
Reaching out to patient to offer assistance regarding upcoming cardiac imaging study; pt verbalizes understanding of appt date/time, parking situation and where to check in, pre-test NPO status and medications ordered, and verified current allergies; name and call back number provided for further questions should they arise Marchia Bond RN Navigator Cardiac Imaging [facility] [phone] office [phone] cell   100mg  metoprolol tartrate 2 hr prior to scan Clarise Cruz

## 2020-07-28 ENCOUNTER — Other Ambulatory Visit: Payer: Self-pay

## 2020-07-28 ENCOUNTER — Telehealth: Payer: Self-pay

## 2020-07-28 ENCOUNTER — Ambulatory Visit (HOSPITAL_COMMUNITY)
Admission: RE | Admit: 2020-07-28 | Discharge: 2020-07-28 | Disposition: A | Discharge status: Home / Self Care | Payer: Medicare Other | Source: Ambulatory Visit | Attending: Cardiology | Admitting: Cardiology

## 2020-07-28 ENCOUNTER — Encounter (HOSPITAL_COMMUNITY): Payer: Self-pay

## 2020-07-28 DIAGNOSIS — R079 Chest pain, unspecified: Secondary | ICD-10-CM

## 2020-07-28 DIAGNOSIS — R9431 Abnormal electrocardiogram [ECG] [EKG]: Secondary | ICD-10-CM

## 2020-07-28 DIAGNOSIS — R0789 Other chest pain: Secondary | ICD-10-CM | POA: Insufficient documentation

## 2020-07-28 MED ORDER — NITROGLYCERIN 0.4 MG SL SUBL: 0.8000 mg | SUBLINGUAL_TABLET | Freq: Once | SUBLINGUAL | Status: AC | PRN

## 2020-07-28 MED ORDER — IOHEXOL 350 MG/ML SOLN
80.0000 mL | Freq: Once | INTRAVENOUS | Status: AC | PRN
  Administered 2020-07-28: 80 mL via INTRAVENOUS

## 2020-07-28 MED ORDER — NITROGLYCERIN 0.4 MG SL SUBL
SUBLINGUAL_TABLET | SUBLINGUAL | Status: AC
  Administered 2020-07-28: 0.8 mg via SUBLINGUAL
  Filled 2020-07-28: qty 2

## 2020-07-28 NOTE — Telephone Encounter (Signed)
-----   Message from Berniece Salines, DO sent at 07/23/2020  4:52 PM EST ----- Labs stable

## 2020-07-28 NOTE — Telephone Encounter (Signed)
Spoke with patient regarding results and recommendation.  Patient verbalizes understanding and is agreeable to plan of care. Advised patient to call back with any issues or concerns.  

## 2020-08-02 DIAGNOSIS — I493 Ventricular premature depolarization: Secondary | ICD-10-CM | POA: Diagnosis not present

## 2020-08-04 ENCOUNTER — Telehealth: Payer: Self-pay | Admitting: Cardiology

## 2020-08-04 NOTE — Telephone Encounter (Signed)
Patient stop with Jasmine bout her results earlier today, but called back to speak bout with her because she had more question to ask. Patients also want know if the results can be share with dr that she has her next procedure with. Please advise

## 2020-08-05 NOTE — Telephone Encounter (Signed)
Spoke to the patient just now and let her know that I have printed her results for her to pick up at her earliest convenience. She verbalizes understanding and thanks me for calling her back.

## 2020-08-05 NOTE — Telephone Encounter (Signed)
    Pt is calling back, she said she would like to get a copy of her CT results. She said she can pick it up today or tomorrow morning

## 2020-08-06 DIAGNOSIS — K432 Incisional hernia without obstruction or gangrene: Secondary | ICD-10-CM | POA: Diagnosis not present

## 2020-08-09 DIAGNOSIS — K760 Fatty (change of) liver, not elsewhere classified: Secondary | ICD-10-CM | POA: Diagnosis not present

## 2020-08-09 DIAGNOSIS — K769 Liver disease, unspecified: Secondary | ICD-10-CM | POA: Diagnosis not present

## 2020-08-13 ENCOUNTER — Telehealth: Payer: Self-pay | Admitting: Cardiology

## 2020-08-13 NOTE — Telephone Encounter (Signed)
FYI--[name] with [place] at [facility] is calling to make [name] aware that they received the referral for this patient. However, when they contacted the patient to schedule she refused because Dr. [name] is out on leave and unable to see her. [name] states she offered an appointment with [name] and the patient declined, so they are closing the referral.  If further questions, return call to [facility] to discuss at [phone] (ext: [phone])

## 2020-08-23 DIAGNOSIS — K760 Fatty (change of) liver, not elsewhere classified: Secondary | ICD-10-CM | POA: Diagnosis not present

## 2020-08-25 ENCOUNTER — Other Ambulatory Visit: Payer: Self-pay

## 2020-08-25 ENCOUNTER — Ambulatory Visit: Payer: Medicare Other | Admitting: Cardiology

## 2020-08-25 ENCOUNTER — Encounter: Payer: Self-pay | Admitting: Cardiology

## 2020-08-25 VITALS — BP 124/82 | HR 77 | Ht 60.0 in | Wt 172.4 lb

## 2020-08-25 DIAGNOSIS — I471 Supraventricular tachycardia: Secondary | ICD-10-CM | POA: Diagnosis not present

## 2020-08-25 DIAGNOSIS — E669 Obesity, unspecified: Secondary | ICD-10-CM

## 2020-08-25 DIAGNOSIS — K5790 Diverticulosis of intestine, part unspecified, without perforation or abscess without bleeding: Secondary | ICD-10-CM | POA: Diagnosis not present

## 2020-08-25 DIAGNOSIS — K219 Gastro-esophageal reflux disease without esophagitis: Secondary | ICD-10-CM | POA: Diagnosis not present

## 2020-08-25 DIAGNOSIS — I10 Essential (primary) hypertension: Secondary | ICD-10-CM | POA: Diagnosis not present

## 2020-08-25 DIAGNOSIS — K760 Fatty (change of) liver, not elsewhere classified: Secondary | ICD-10-CM | POA: Diagnosis not present

## 2020-08-25 MED ORDER — METOPROLOL TARTRATE 25 MG PO TABS: 25.0000 mg | ORAL_TABLET | ORAL | 0 refills | Status: AC | PRN

## 2020-08-25 MED ORDER — METOPROLOL TARTRATE 25 MG PO TABS: 12.5000 mg | ORAL_TABLET | ORAL | 3 refills | Status: DC | PRN

## 2020-08-25 NOTE — Patient Instructions (Signed)
Medication Instructions:  Your physician has recommended you make the following change in your medication:  START: Lopressor 12.5 mg for palpations and heart rate greater than 130 beats per minute.  *If you need a refill on your cardiac medications before your next appointment, please call your pharmacy*   Lab Work: None If you have labs (blood work) drawn today and your tests are completely normal, you will receive your results only by: Marland Kitchen MyChart Message (if you have MyChart) OR . A paper copy in the mail If you have any lab test that is abnormal or we need to change your treatment, we will call you to review the results.   Testing/Procedures: None   Follow-Up: At [facility], you and your health needs are our priority.  As part of our continuing mission to provide you with exceptional heart care, we have created designated Provider Care Teams.  These Care Teams include your primary Cardiologist (physician) and Advanced Practice Providers (APPs -  Physician Assistants and Nurse Practitioners) who all work together to provide you with the care you need, when you need it.  We recommend signing up for the patient portal called "MyChart".  Sign up information is provided on this After Visit Summary.  MyChart is used to connect with patients for Virtual Visits (Telemedicine).  Patients are able to view lab/test results, encounter notes, upcoming appointments, etc.  Non-urgent messages can be sent to your provider as well.   To learn more about what you can do with MyChart, go to [email or web address].    Your next appointment:   1 year(s)  The format for your next appointment:   In Person  Provider:   Berniece Salines, DO   Other Instructions

## 2020-08-25 NOTE — Progress Notes (Signed)
Cardiology Office Note:    Date:  08/25/2020   ID:  Elizabeth Mcclain, DOB 06-20-1945, MRN [id number]  PCP:  Angelina Sheriff, MD  Cardiologist:  Berniece Salines, DO  Electrophysiologist:  None   Referring MD: Angelina Sheriff, MD   I am doing well  History of Present Illness:    Elizabeth Mcclain is a 75 y.o. female with a hx of hypertension, hypothyroidism is here today for follow-up visit.  Initially saw the patient February 2022 at that time she experiencing some intermittent chest pain as well as palpitations.  During her visit she had abnormal EKG as well as given the fact that she did have some chest pressure coronary CTA was recommended.  In terms of the palpitations he monitors with the patient per In the interim she was able to get this testing done.  She is here today to discuss results. She tells me that she has been feeling very well she has not had any chest pain she has not had any palpitations since I last saw her.  Past Medical History:  Diagnosis Date  . Anxiety   . Arthritis   . Blood dyscrasia    Factor V Leiden  . Cancer ([place])    skin; basal and melanoma  . Depression   . GERD (gastroesophageal reflux disease)    takes tums or rolaids  . Hypertension   . Hypothyroidism   . Occasional tremors   . Reflux   . Thyroid disease     Past Surgical History:  Procedure Laterality Date  . ABDOMINAL HYSTERECTOMY     partial  . BLADDER REPAIR    . CHOLECYSTECTOMY    . INCISIONAL HERNIA REPAIR N/A 07/03/2018   Procedure: LAPAROSCOPIC INCISIONAL HERNIA REPAIR WITH MESH;  Surgeon: Erroll Luna, MD;  Location: [place];  Service: General;  Laterality: N/A;  . INSERTION OF MESH N/A 05/30/2016   Procedure: INSERTION OF MESH;  Surgeon: Erroll Luna, MD;  Location: [place];  Service: General;  Laterality: N/A;  . KNEE SURGERY Bilateral   . RECTOCELE REPAIR    . SHOULDER ARTHROSCOPY Bilateral   . UMBILICAL HERNIA REPAIR N/A 05/30/2016   Procedure:  UMBILICAL HERNIA REPAIR;  Surgeon: Erroll Luna, MD;  Location: [place];  Service: General;  Laterality: N/A;    Current Medications: Current Meds  Medication Sig  . amLODipine (NORVASC) 5 MG tablet Take 5 mg by mouth daily.  . hydrochlorothiazide (HYDRODIURIL) 25 MG tablet Take 25 mg by mouth daily.  Marland Kitchen levothyroxine (SYNTHROID, LEVOTHROID) 25 MCG tablet Take 25 mcg by mouth daily.  Marland Kitchen losartan (COZAAR) 25 MG tablet Take 25 mg by mouth daily.  . metoprolol tartrate (LOPRESSOR) 25 MG tablet Take 1 tablet (25 mg total) by mouth as needed (For palpations and heart rate great then 130 beats per minute).  . Multiple Vitamin (MULTIVITAMIN WITH MINERALS) TABS tablet Take 1 tablet by mouth daily.  Marland Kitchen omeprazole (PRILOSEC) 20 MG capsule Take 20 mg by mouth daily.  Marland Kitchen PARoxetine (PAXIL) 40 MG tablet Take 40 mg by mouth daily.   . vitamin B-12 (CYANOCOBALAMIN) 1000 MCG tablet Take 1,000 mcg by mouth daily.  . Vitamin D, Ergocalciferol, (DRISDOL) 50000 units CAPS capsule Take 50,000 Units by mouth 2 (two) times a week.   . [DISCONTINUED] metoprolol tartrate (LOPRESSOR) 25 MG tablet Take 0.5 tablets (12.5 mg total) by mouth as needed (For palpations and heart rate greater then 130 beats per minute.).  Allergies:   Felodipine, Norvasc [amlodipine], Oxycodone-acetaminophen, Penicillins, Percocet [oxycodone-acetaminophen], and Venlafaxine   Social History   Socioeconomic History  . Marital status: Married    Spouse name: Not on file  . Number of children: Not on file  . Years of education: Not on file  . Highest education level: Not on file  Occupational History  . Not on file  Tobacco Use  . Smoking status: Never Smoker  . Smokeless tobacco: Never Used  Vaping Use  . Vaping Use: Never used  Substance and Sexual Activity  . Alcohol use: No    Alcohol/week: 0.0 standard drinks  . Drug use: No  . Sexual activity: Not on file  Other Topics Concern  . Not on file  Social  History Narrative   Right handed   One story home   Drinks caffeine   Social Determinants of Health   Financial Resource Strain: Not on file  Food Insecurity: Not on file  Transportation Needs: Not on file  Physical Activity: Not on file  Stress: Not on file  Social Connections: Not on file     Family History: The patient's family history includes Breast cancer in her mother; Leukemia in her brother and mother; Lung cancer in her father.  ROS:   Review of Systems  Constitution: Negative for decreased appetite, fever and weight gain.  HENT: Negative for congestion, ear discharge, hoarse voice and sore throat.   Eyes: Negative for discharge, redness, vision loss in right eye and visual halos.  Cardiovascular: Negative for chest pain, dyspnea on exertion, leg swelling, orthopnea and palpitations.  Respiratory: Negative for cough, hemoptysis, shortness of breath and snoring.   Endocrine: Negative for heat intolerance and polyphagia.  Hematologic/Lymphatic: Negative for bleeding problem. Does not bruise/bleed easily.  Skin: Negative for flushing, nail changes, rash and suspicious lesions.  Musculoskeletal: Negative for arthritis, joint pain, muscle cramps, myalgias, neck pain and stiffness.  Gastrointestinal: Negative for abdominal pain, bowel incontinence, diarrhea and excessive appetite.  Genitourinary: Negative for decreased libido, genital sores and incomplete emptying.  Neurological: Negative for brief paralysis, focal weakness, headaches and loss of balance.  Psychiatric/Behavioral: Negative for altered mental status, depression and suicidal ideas.  Allergic/Immunologic: Negative for HIV exposure and persistent infections.    EKGs/Labs/Other Studies Reviewed:    The following studies were reviewed today:   EKG: None today  Cardiac CT July 28, 2020 Aorta: Normal size.  No calcifications.  No dissection.  Aortic Valve:  Trileaflet.  No calcifications.  Coronary  Arteries:  Normal coronary origin.  Right dominance.  RCA is a large dominant artery that gives rise to PDA and PLVB. There is no plaque.  Left main is a large artery that gives rise to LAD and LCX arteries.  LAD is a large vessel that has no plaque.  LCX is a non-dominant artery that gives rise to one large OM1 branch. There is no plaque.  Other findings:  Normal pulmonary vein drainage into the left atrium.  Normal left atrial appendage without a thrombus.  Normal size of the pulmonary artery.  IMPRESSION: 1. Coronary calcium score of 0. This was 0 percentile for age and sex matched control.  2. Normal coronary origin with right dominance.  3. No evidence of CAD.  Berniece Salines, DO    ZIO monitor The patient wore the monitor for 7 days starting July 11, 2020. Indication: Premature ventricular complex  The minimum heart rate was 63 bpm, maximum heart rate was 160 bpm, and average heart  rate was 81 bpm. Predominant underlying rhythm was Sinus Rhythm.  6 Supraventricular Tachycardia runs occurred, the run with the fastest interval lasting 1 min 10 secs with a maximal rate of 160 bpm (average 149 bpm); the run with the fastest interval was also the longest  Premature atrial complexes were rare less than 1%. Premature Ventricular complexes were rare less than 1%.  No pauses, No AV block and no atrial fibrillation present. 5 patient triggered events and 4 diary events are associated with sinus rhythm and premature atrial complex.  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   Recent Labs: 07/23/2020: BUN 23; Creatinine, Ser 0.76; Magnesium 1.9; Potassium 3.9; Sodium 141  Recent Lipid Panel No results found for: CHOL, TRIG, HDL, CHOLHDL, VLDL, LDLCALC, LDLDIRECT  Physical Exam:    VS:  BP 124/82   Pulse 77   Ht 5' (1.524 m)   Wt  172 lb 6.4 oz (78.2 kg)   SpO2 97%   BMI 33.67 kg/m     Wt Readings from Last 3 Encounters:  08/25/20 172 lb 6.4 oz (78.2 kg)  07/19/20 171 lb 6.4 oz (77.7 kg)  03/02/20 171 lb (77.6 kg)     GEN: Well nourished, well developed in no acute distress HEENT: Normal NECK: No JVD; No carotid bruits LYMPHATICS: No lymphadenopathy CARDIAC: S1S2 noted,RRR, no murmurs, rubs, gallops RESPIRATORY:  Clear to auscultation without rales, wheezing or rhonchi  ABDOMEN: Soft, non-tender, non-distended, +bowel sounds, no guarding. EXTREMITIES: No edema, No cyanosis, no clubbing MUSCULOSKELETAL:  No deformity  SKIN: Warm and dry NEUROLOGIC:  Alert and oriented x 3, non-focal PSYCHIATRIC:  Normal affect, good insight  ASSESSMENT:    1. 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 Obesity (BMI 30-39.9)   4. Hypertension, unspecified type    PLAN:     Her ZIO monitor did show few episodes of supraventricular tachycardia which is suspected to be AVNRT.  Educated patient about this.  I did discuss with patient about treatment plan.  First educated her about the vagal maneuver and how to break these episodes.  In addition to the patient will be nice to put her on low-dose AV nodal blockers.  At this time she really would prefer to not be on any medication.  But she is agreeable to have Lopressor 12.5 as needed for palpitations and heart rate greater than 130.  Coronary CTA did not show any coronary artery disease with 0 calcium.  Blood pressure is acceptable, continue with current antihypertensive regimen.  The patient understands the need to lose weight with diet and exercise. We have discussed specific strategies for this.  The patient is in agreement with the above plan. The patient left the office in stable condition.  The patient will follow up in   Medication Adjustments/Labs and Tests Ordered: Current medicines are reviewed at length with  the patient today.  Concerns regarding medicines are outlined above.  No orders of the defined types were placed in this encounter.  Meds ordered this encounter  Medications  . DISCONTD: metoprolol tartrate (LOPRESSOR) 25 MG tablet    Sig: Take 0.5 tablets (12.5 mg total) by mouth as needed (For palpations and heart rate greater then 130 beats per minute.).    Dispense:  90 tablet    Refill:  3  . metoprolol tartrate (LOPRESSOR) 25 MG tablet    Sig: Take 1  tablet (25 mg total) by mouth as needed (For palpations and heart rate great then 130 beats per minute).    Dispense:  15 tablet    Refill:  0    Patient Instructions  Medication Instructions:  Your physician has recommended you make the following change in your medication:  START: Lopressor 12.5 mg for palpations and heart rate greater than 130 beats per minute.  *If you need a refill on your cardiac medications before your next appointment, please call your pharmacy*   Lab Work: None If you have labs (blood work) drawn today and your tests are completely normal, you will receive your results only by: Marland Kitchen MyChart Message (if you have MyChart) OR . A paper copy in the mail If you have any lab test that is abnormal or we need to change your treatment, we will call you to review the results.   Testing/Procedures: None   Follow-Up: At [facility], you and your health needs are our priority.  As part of our continuing mission to provide you with exceptional heart care, we have created designated Provider Care Teams.  These Care Teams include your primary Cardiologist (physician) and Advanced Practice Providers (APPs -  Physician Assistants and Nurse Practitioners) who all work together to provide you with the care you need, when you need it.  We recommend signing up for the patient portal called "MyChart".  Sign up information is provided on this After Visit Summary.  MyChart is used to connect with patients for Virtual Visits  (Telemedicine).  Patients are able to view lab/test results, encounter notes, upcoming appointments, etc.  Non-urgent messages can be sent to your provider as well.   To learn more about what you can do with MyChart, go to [email or web address].    Your next appointment:   1 year(s)  The format for your next appointment:   In Person  Provider:   Berniece Salines, DO   Other Instructions      Adopting a Healthy Lifestyle.  Know what a healthy weight is for you (roughly BMI <25) and aim to maintain this   Aim for 7+ servings of fruits and vegetables daily   65-80+ fluid ounces of water or unsweet tea for healthy kidneys   Limit to max 1 drink of alcohol per day; avoid smoking/tobacco   Limit animal fats in diet for cholesterol and heart health - choose grass fed whenever available   Avoid highly processed foods, and foods high in saturated/trans fats   Aim for low stress - take time to unwind and care for your mental health   Aim for 150 min of moderate intensity exercise weekly for heart health, and weights twice weekly for bone health   Aim for 7-9 hours of sleep daily   When it comes to diets, agreement about the perfect plan isnt easy to find, even among the experts. Experts at the [place] developed an idea known as the Healthy Eating Plate. Just imagine a plate divided into logical, healthy portions.   The emphasis is on diet quality:   Load up on vegetables and fruits - one-half of your plate: Aim for color and variety, and remember that potatoes dont count.   Go for whole grains - one-quarter of your plate: Whole wheat, barley, wheat berries, quinoa, oats, brown rice, and foods made with them. If you want pasta, go with whole wheat pasta.   Protein power - one-quarter of your plate: Fish, chicken, beans, and nuts  are all healthy, versatile protein sources. Limit red meat.   The diet, however, does go beyond the plate, offering a few other  suggestions.   Use healthy plant oils, such as olive, canola, soy, corn, sunflower and peanut. Check the labels, and avoid partially hydrogenated oil, which have unhealthy trans fats.   If youre thirsty, drink water. Coffee and tea are good in moderation, but skip sugary drinks and limit milk and dairy products to one or two daily servings.   The type of carbohydrate in the diet is more important than the amount. Some sources of carbohydrates, such as vegetables, fruits, whole grains, and beans-are healthier than others.   Finally, stay active  Signed, Berniece Salines, DO  08/25/2020 9:01 AM    [place]

## 2020-10-20 ENCOUNTER — Ambulatory Visit: Payer: Medicare Other | Admitting: Cardiology

## 2020-11-11 DIAGNOSIS — M7989 Other specified soft tissue disorders: Secondary | ICD-10-CM | POA: Diagnosis not present

## 2020-11-11 DIAGNOSIS — M79604 Pain in right leg: Secondary | ICD-10-CM | POA: Diagnosis not present

## 2020-11-11 DIAGNOSIS — M79669 Pain in unspecified lower leg: Secondary | ICD-10-CM | POA: Diagnosis not present

## 2020-11-15 DIAGNOSIS — M25561 Pain in right knee: Secondary | ICD-10-CM | POA: Diagnosis not present

## 2020-11-18 DIAGNOSIS — M25561 Pain in right knee: Secondary | ICD-10-CM | POA: Diagnosis not present

## 2020-11-23 DIAGNOSIS — M1711 Unilateral primary osteoarthritis, right knee: Secondary | ICD-10-CM | POA: Diagnosis not present

## 2020-11-23 DIAGNOSIS — M25561 Pain in right knee: Secondary | ICD-10-CM | POA: Diagnosis not present

## 2020-11-25 DIAGNOSIS — Z01818 Encounter for other preprocedural examination: Secondary | ICD-10-CM | POA: Diagnosis not present

## 2020-12-16 DIAGNOSIS — Z1231 Encounter for screening mammogram for malignant neoplasm of breast: Secondary | ICD-10-CM | POA: Diagnosis not present

## 2020-12-21 DIAGNOSIS — R928 Other abnormal and inconclusive findings on diagnostic imaging of breast: Secondary | ICD-10-CM | POA: Diagnosis not present

## 2020-12-21 DIAGNOSIS — R922 Inconclusive mammogram: Secondary | ICD-10-CM | POA: Diagnosis not present

## 2020-12-21 DIAGNOSIS — N6002 Solitary cyst of left breast: Secondary | ICD-10-CM | POA: Diagnosis not present

## 2020-12-22 DIAGNOSIS — G8918 Other acute postprocedural pain: Secondary | ICD-10-CM | POA: Diagnosis not present

## 2020-12-22 DIAGNOSIS — M1711 Unilateral primary osteoarthritis, right knee: Secondary | ICD-10-CM | POA: Diagnosis not present

## 2020-12-25 ENCOUNTER — Emergency Department (HOSPITAL_COMMUNITY)
Admission: EM | Admit: 2020-12-25 | Discharge: 2020-12-25 | Disposition: A | Discharge status: Home / Self Care | Payer: Medicare Other | Attending: Emergency Medicine | Admitting: Emergency Medicine

## 2020-12-25 ENCOUNTER — Emergency Department (HOSPITAL_BASED_OUTPATIENT_CLINIC_OR_DEPARTMENT_OTHER): Payer: Medicare Other

## 2020-12-25 ENCOUNTER — Other Ambulatory Visit: Payer: Self-pay

## 2020-12-25 ENCOUNTER — Encounter (HOSPITAL_COMMUNITY): Payer: Self-pay | Admitting: Emergency Medicine

## 2020-12-25 DIAGNOSIS — Z79899 Other long term (current) drug therapy: Secondary | ICD-10-CM | POA: Insufficient documentation

## 2020-12-25 DIAGNOSIS — G8918 Other acute postprocedural pain: Secondary | ICD-10-CM | POA: Insufficient documentation

## 2020-12-25 DIAGNOSIS — M7989 Other specified soft tissue disorders: Secondary | ICD-10-CM | POA: Diagnosis not present

## 2020-12-25 DIAGNOSIS — M25561 Pain in right knee: Secondary | ICD-10-CM | POA: Diagnosis not present

## 2020-12-25 DIAGNOSIS — E039 Hypothyroidism, unspecified: Secondary | ICD-10-CM | POA: Diagnosis not present

## 2020-12-25 DIAGNOSIS — Z85828 Personal history of other malignant neoplasm of skin: Secondary | ICD-10-CM | POA: Insufficient documentation

## 2020-12-25 DIAGNOSIS — R2241 Localized swelling, mass and lump, right lower limb: Secondary | ICD-10-CM | POA: Insufficient documentation

## 2020-12-25 DIAGNOSIS — M79604 Pain in right leg: Secondary | ICD-10-CM

## 2020-12-25 DIAGNOSIS — I10 Essential (primary) hypertension: Secondary | ICD-10-CM | POA: Insufficient documentation

## 2020-12-25 MED ORDER — ACETAMINOPHEN 325 MG PO TABS: 650.0000 mg | ORAL_TABLET | Freq: Four times a day (QID) | ORAL | Status: DC | PRN

## 2020-12-25 MED ORDER — OXYCODONE HCL 5 MG PO TABS
10.0000 mg | ORAL_TABLET | ORAL | Status: DC | PRN
  Administered 2020-12-25: 10 mg via ORAL
  Filled 2020-12-25: qty 2

## 2020-12-25 NOTE — Discharge Instructions (Addendum)
Your ultrasound did not reveal evidence of DVT/blood clot in the lower extremity.  You are likely experiencing postoperative pain and swelling.  Follow-up in clinic with your orthopedist as scheduled.

## 2020-12-25 NOTE — ED Triage Notes (Signed)
Pt had total R knee replacement on Wednesday by Dr. Lorre Nick.  C/o R knee pain, swelling, and bruising. Also reports R calf pain.

## 2020-12-25 NOTE — Progress Notes (Signed)
RLE venous duplex has been completed.  Preliminary results given to Dr. Armandina Gemma.  Results can be found under chart review under CV PROC. 12/25/2020 2:16 PM Malayla Granberry RVT, RDMS

## 2020-12-25 NOTE — ED Provider Notes (Signed)
[facility] EMERGENCY DEPARTMENT Provider Note   CSN: [id number] Arrival date & time: 12/25/20  1047     History No chief complaint on file.   Elizabeth Mcclain is a 75 y.o. female.  HPI  75 year old female with a history of arthritis, hypertension, hypothyroidism, factor V Leiden not on chronic anticoagulation who presents to the emergency department with postoperative pain and swelling in her right lower extremity.  The patient underwent right total knee replacement this past Wednesday with Dr. Ronnie Derby.  She was placed on Eliquis due to a history of factor V Leiden postoperatively.  She endorses worsening right knee pain, diffuse swelling, development of hematomas throughout her calves with additional right calf pain.  Past Medical History:  Diagnosis Date   Anxiety    Arthritis    Blood dyscrasia    Factor V Leiden   Cancer ([place])    skin; basal and melanoma   Depression    GERD (gastroesophageal reflux disease)    takes tums or rolaids   Hypertension    Hypothyroidism    Occasional tremors    Reflux    Thyroid disease     Patient Active Problem List   Diagnosis Date Noted   PVC (premature ventricular contraction) 07/19/2020   Nonspecific abnormal electrocardiogram (ECG) (EKG) 07/19/2020   Chest pressure 07/19/2020   Palpitations 07/19/2020   Obesity (BMI 30-39.9) 07/19/2020   Thyroid disease    Occasional tremors    Hypothyroidism    Hypertension    GERD (gastroesophageal reflux disease)    Depression    Cancer (HCC)    Blood dyscrasia    Arthritis    Anxiety    Encounter for counseling 01/19/2020   Pain in right foot 02/24/2019   Hallux limitus of left foot 11/12/2018   Pain of left great toe 11/12/2018   Paronychia of second toe, left 11/12/2018   Acute respiratory failure with hypoxia (HCC)    Incisional hernia without obstruction or gangrene 07/03/2018   Primary osteoarthritis of left knee 09/12/2017   Tremor 02/20/2017   Scalp pain  04/30/2015   Torticollis, acquired 04/30/2015   Dystonia 01/04/2015   Essential tremor 01/04/2015    Past Surgical History:  Procedure Laterality Date   ABDOMINAL HYSTERECTOMY     partial   BLADDER REPAIR     CHOLECYSTECTOMY     INCISIONAL HERNIA REPAIR N/A 07/03/2018   Procedure: LAPAROSCOPIC INCISIONAL HERNIA REPAIR WITH MESH;  Surgeon: Erroll Luna, MD;  Location: [facility];  Service: General;  Laterality: N/A;   INSERTION OF MESH N/A 05/30/2016   Procedure: INSERTION OF MESH;  Surgeon: Erroll Luna, MD;  Location: [facility];  Service: General;  Laterality: N/A;   KNEE SURGERY Bilateral    RECTOCELE REPAIR     SHOULDER ARTHROSCOPY Bilateral    UMBILICAL HERNIA REPAIR N/A 05/30/2016   Procedure: UMBILICAL HERNIA REPAIR;  Surgeon: Erroll Luna, MD;  Location: [facility];  Service: General;  Laterality: N/A;     OB History   No obstetric history on file.     Family History  Problem Relation Age of Onset   Leukemia Mother    Breast cancer Mother    Lung cancer Father    Leukemia Brother     Social History   Tobacco Use   Smoking status: Never   Smokeless tobacco: Never  Vaping Use   Vaping Use: Never used  Substance Use Topics   Alcohol use: No  Alcohol/week: 0.0 standard drinks   Drug use: No    Home Medications Prior to Admission medications   Medication Sig Start Date End Date Taking? Authorizing Provider  amLODipine (NORVASC) 5 MG tablet Take 5 mg by mouth daily.    [provider]  hydrochlorothiazide (HYDRODIURIL) 25 MG tablet Take 25 mg by mouth daily.    [provider]  levothyroxine (SYNTHROID, LEVOTHROID) 25 MCG tablet Take 25 mcg by mouth daily. 06/02/15   [provider]  losartan (COZAAR) 25 MG tablet Take 25 mg by mouth daily. 07/12/20   [provider]  metoprolol tartrate (LOPRESSOR) 25 MG tablet Take 1 tablet (25 mg total) by mouth as needed (For palpations and heart rate great  then 130 beats per minute). 08/25/20 11/23/20  Tobb, Godfrey Pick, DO  Multiple Vitamin (MULTIVITAMIN WITH MINERALS) TABS tablet Take 1 tablet by mouth daily.    [provider]  omeprazole (PRILOSEC) 20 MG capsule Take 20 mg by mouth daily.    [provider]  PARoxetine (PAXIL) 40 MG tablet Take 40 mg by mouth daily.  06/02/15   [provider]  vitamin B-12 (CYANOCOBALAMIN) 1000 MCG tablet Take 1,000 mcg by mouth daily.    [provider]  Vitamin D, Ergocalciferol, (DRISDOL) 50000 units CAPS capsule Take 50,000 Units by mouth 2 (two) times a week.  08/03/15   [provider]    Allergies    Felodipine, Norvasc [amlodipine], Oxycodone-acetaminophen, Penicillins, Percocet [oxycodone-acetaminophen], and Venlafaxine  Review of Systems   Review of Systems  Constitutional:  Negative for chills and fever.  HENT:  Negative for ear pain and sore throat.   Eyes:  Negative for pain and visual disturbance.  Respiratory:  Negative for cough and shortness of breath.   Cardiovascular:  Negative for chest pain and palpitations.  Gastrointestinal:  Negative for abdominal pain and vomiting.  Genitourinary:  Negative for dysuria and hematuria.  Musculoskeletal:  Positive for arthralgias, joint swelling and myalgias. Negative for back pain.  Skin:  Negative for color change and rash.  Neurological:  Negative for seizures and syncope.  All other systems reviewed and are negative.  Physical Exam Updated Vital Signs BP (!) 148/77 (BP Location: Right Arm)   Pulse 77   Temp 98 F (36.7 C) (Oral)   Resp 15   SpO2 99%   Physical Exam Vitals and nursing note reviewed.  Constitutional:      General: She is not in acute distress.    Appearance: She is well-developed.  HENT:     Head: Normocephalic and atraumatic.  Eyes:     Conjunctiva/sclera: Conjunctivae normal.  Cardiovascular:     Rate and Rhythm: Normal rate and regular rhythm.     Heart sounds: No murmur  heard. Pulmonary:     Effort: Pulmonary effort is normal. No respiratory distress.     Breath sounds: Normal breath sounds.  Abdominal:     Palpations: Abdomen is soft.     Tenderness: There is no abdominal tenderness.  Musculoskeletal:        General: Swelling present.     Cervical back: Neck supple.     Right lower leg: Edema present.     Comments: Right lower extremity with diffuse ecchymosis, compartments soft, no significant pain on palpation of the compartments, no pain with passive extension, distal pulses intact  Skin:    General: Skin is warm and dry.  Neurological:     General: No focal deficit present.  Mental Status: She is alert and oriented to person, place, and time.    ED Results / Procedures / Treatments   Labs (all labs ordered are listed, but only abnormal results are displayed) Labs Reviewed - No data to display  EKG None  Radiology VAS [place] LOWER EXTREMITY VENOUS (DVT) (ONLY [facility] & [facility])  Result Date: 12/25/2020  Lower Venous DVT Study Patient Name:  ESTEBAN SALERA  Date of Exam:   12/25/2020 Medical Rec #: [id number]         Accession #:    [id number] Date of Birth: Mar 14, 1946         Patient Gender: F Patient Age:   56 years Exam Location:  [facility] Procedure:      VAS [place] LOWER EXTREMITY VENOUS (DVT) Referring Phys: Regan Lemming --------------------------------------------------------------------------------  Indications: Pain, Swelling, and Bruising.  Risk Factors: Surgery RT TKR (12/22/2020). Limitations: Poor ultrasound/tissue interface. Comparison Study: No previous exams Performing Technologist: Jody Hill RVT, RDMS  Examination Guidelines: A complete evaluation includes B-mode imaging, spectral Doppler, color Doppler, and power Doppler as needed of all accessible portions of each vessel. Bilateral testing is considered an integral part of a complete examination. Limited examinations for reoccurring indications may be performed as noted. The reflux portion  of the exam is performed with the patient in reverse Trendelenburg.  +---------+---------------+---------+-----------+----------+-------------------+ RIGHT    CompressibilityPhasicitySpontaneityPropertiesThrombus Aging      +---------+---------------+---------+-----------+----------+-------------------+ CFV      Full           Yes      Yes                                      +---------+---------------+---------+-----------+----------+-------------------+ SFJ      Full                                                             +---------+---------------+---------+-----------+----------+-------------------+ FV Prox  Full           Yes      Yes                                      +---------+---------------+---------+-----------+----------+-------------------+ FV Mid   Full           Yes      Yes                                      +---------+---------------+---------+-----------+----------+-------------------+ FV DistalFull           Yes      Yes                                      +---------+---------------+---------+-----------+----------+-------------------+ PFV      Full                                                             +---------+---------------+---------+-----------+----------+-------------------+  POP      Full           Yes      Yes                                      +---------+---------------+---------+-----------+----------+-------------------+ PTV      Full                                         Not well visualized +---------+---------------+---------+-----------+----------+-------------------+ PERO     Full                                         Not well visualized +---------+---------------+---------+-----------+----------+-------------------+   +----+---------------+---------+-----------+----------+--------------+ LEFTCompressibilityPhasicitySpontaneityPropertiesThrombus Aging  +----+---------------+---------+-----------+----------+--------------+ CFV Full           Yes      Yes                                 +----+---------------+---------+-----------+----------+--------------+    Summary: RIGHT: - There is no evidence of deep vein thrombosis in the lower extremity. - There is no evidence of superficial venous thrombosis.  - No cystic structure found in the popliteal fossa. Subcutaneous edema in area of pop fossa and calf.  LEFT: - No evidence of common femoral vein obstruction.  *See table(s) above for measurements and observations.    Preliminary     Procedures Procedures   Medications Ordered in ED Medications  acetaminophen (TYLENOL) tablet 650 mg (has no administration in time range)  oxyCODONE (Oxy IR/ROXICODONE) immediate release tablet 10 mg (10 mg Oral Given 12/25/20 1318)    ED Course  I have reviewed the triage vital signs and the nursing notes.  Pertinent labs & imaging results that were available during my care of the patient were reviewed by me and considered in my medical decision making (see chart for details).    MDM Rules/Calculators/A&P                           75 year old female presenting with postoperative pain and swelling in the right lower extremity, status post total knee replacement with orthopedics on 12/22/2020.  Patient has a history of factor V Leiden was placed on Eliquis postoperatively, has had worsening swelling and ecchymosis of the right lower extremity extensor surgery.  Was told to present to the emergency department from urgent care due to concern for possible DVT.  On exam, the patient's compartments are soft, distal pulses are intact.  Low concern for compartment syndrome at this time.  Differential diagnosis includes postoperative edema, DVT, less likely compartment syndrome.  Will evaluate further with lower extremity DVT ultrasound on the right. The patient was administered oral pain medication in the ED.   DVT  ultrasound performed negative for acute DVT.  The patient's compartments remain soft in the emergency department.  She did not have pain out of proportion to exam.  She had no pain with passive stretch of the toes.  No paresthesias.  Ecchymosis and swelling present in the lower extremity considered to be likely postoperative swelling with ecchymosis present due to the patient's Eliquis use.  Overall  stable for continued outpatient management with plan for outpatient follow-up with her orthopedist.  Final Clinical Impression(s) / ED Diagnoses Final diagnoses:  Post-operative pain  Right leg swelling    Rx / DC Orders ED Discharge Orders     None        Regan Lemming, MD 12/25/20 [phone]

## 2020-12-30 DIAGNOSIS — Z96651 Presence of right artificial knee joint: Secondary | ICD-10-CM | POA: Diagnosis not present

## 2021-01-21 DIAGNOSIS — M25561 Pain in right knee: Secondary | ICD-10-CM | POA: Diagnosis not present

## 2021-01-21 DIAGNOSIS — M62551 Muscle wasting and atrophy, not elsewhere classified, right thigh: Secondary | ICD-10-CM | POA: Diagnosis not present

## 2021-01-21 DIAGNOSIS — M25461 Effusion, right knee: Secondary | ICD-10-CM | POA: Diagnosis not present

## 2021-01-21 DIAGNOSIS — R2689 Other abnormalities of gait and mobility: Secondary | ICD-10-CM | POA: Diagnosis not present

## 2021-01-21 DIAGNOSIS — Z4733 Aftercare following explantation of knee joint prosthesis: Secondary | ICD-10-CM | POA: Diagnosis not present

## 2021-02-14 ENCOUNTER — Ambulatory Visit: Payer: Medicare Other | Admitting: Cardiology

## 2021-02-14 DIAGNOSIS — J069 Acute upper respiratory infection, unspecified: Secondary | ICD-10-CM | POA: Diagnosis not present

## 2021-05-10 DIAGNOSIS — Z96651 Presence of right artificial knee joint: Secondary | ICD-10-CM | POA: Diagnosis not present

## 2021-06-14 ENCOUNTER — Ambulatory Visit: Payer: Self-pay | Admitting: Surgery

## 2021-06-14 DIAGNOSIS — K439 Ventral hernia without obstruction or gangrene: Secondary | ICD-10-CM | POA: Diagnosis not present

## 2021-10-07 DIAGNOSIS — M545 Low back pain, unspecified: Secondary | ICD-10-CM | POA: Diagnosis not present

## 2021-10-07 DIAGNOSIS — D510 Vitamin B12 deficiency anemia due to intrinsic factor deficiency: Secondary | ICD-10-CM | POA: Diagnosis not present

## 2021-10-07 DIAGNOSIS — E039 Hypothyroidism, unspecified: Secondary | ICD-10-CM | POA: Diagnosis not present

## 2021-10-07 DIAGNOSIS — R5383 Other fatigue: Secondary | ICD-10-CM | POA: Diagnosis not present

## 2021-10-24 ENCOUNTER — Institutional Professional Consult (permissible substitution): Payer: Medicare Other | Admitting: Plastic Surgery

## 2021-11-28 ENCOUNTER — Institutional Professional Consult (permissible substitution): Payer: Medicare Other | Admitting: Plastic Surgery

## 2021-12-01 DIAGNOSIS — N201 Calculus of ureter: Secondary | ICD-10-CM | POA: Diagnosis not present

## 2021-12-13 DIAGNOSIS — N201 Calculus of ureter: Secondary | ICD-10-CM | POA: Diagnosis not present

## 2021-12-26 DIAGNOSIS — E039 Hypothyroidism, unspecified: Secondary | ICD-10-CM | POA: Diagnosis not present

## 2021-12-26 DIAGNOSIS — Z0000 Encounter for general adult medical examination without abnormal findings: Secondary | ICD-10-CM | POA: Diagnosis not present

## 2021-12-26 DIAGNOSIS — Z9181 History of falling: Secondary | ICD-10-CM | POA: Diagnosis not present

## 2022-01-04 ENCOUNTER — Institutional Professional Consult (permissible substitution): Payer: Medicare Other | Admitting: Plastic Surgery

## 2022-01-06 ENCOUNTER — Institutional Professional Consult (permissible substitution): Payer: Medicare Other | Admitting: Plastic Surgery

## 2022-01-19 DIAGNOSIS — E785 Hyperlipidemia, unspecified: Secondary | ICD-10-CM | POA: Diagnosis not present

## 2022-01-19 DIAGNOSIS — I10 Essential (primary) hypertension: Secondary | ICD-10-CM | POA: Diagnosis not present

## 2022-02-18 DIAGNOSIS — E785 Hyperlipidemia, unspecified: Secondary | ICD-10-CM | POA: Diagnosis not present

## 2022-02-18 DIAGNOSIS — I10 Essential (primary) hypertension: Secondary | ICD-10-CM | POA: Diagnosis not present

## 2022-02-20 ENCOUNTER — Encounter: Payer: Self-pay | Admitting: Plastic Surgery

## 2022-03-13 ENCOUNTER — Institutional Professional Consult (permissible substitution): Payer: Medicare Other | Admitting: Plastic Surgery

## 2022-03-15 ENCOUNTER — Institutional Professional Consult (permissible substitution): Payer: Medicare Other | Admitting: Plastic Surgery

## 2022-03-31 ENCOUNTER — Telehealth: Payer: Self-pay | Admitting: Plastic Surgery

## 2022-03-31 NOTE — Telephone Encounter (Signed)
LVM that appt on 12/7 has been moved up to 3:00 pm instead of 3:15.  To please contact office if this did not work for her and we would get this appt rescheduled to another time that works for her.

## 2022-04-20 DIAGNOSIS — I10 Essential (primary) hypertension: Secondary | ICD-10-CM | POA: Diagnosis not present

## 2022-04-20 DIAGNOSIS — E785 Hyperlipidemia, unspecified: Secondary | ICD-10-CM | POA: Diagnosis not present

## 2022-04-27 ENCOUNTER — Institutional Professional Consult (permissible substitution): Payer: Medicare Other | Admitting: Plastic Surgery

## 2022-05-21 DIAGNOSIS — E785 Hyperlipidemia, unspecified: Secondary | ICD-10-CM | POA: Diagnosis not present

## 2022-05-21 DIAGNOSIS — I10 Essential (primary) hypertension: Secondary | ICD-10-CM | POA: Diagnosis not present

## 2022-07-17 DIAGNOSIS — K439 Ventral hernia without obstruction or gangrene: Secondary | ICD-10-CM | POA: Diagnosis not present

## 2022-07-19 ENCOUNTER — Other Ambulatory Visit: Payer: Self-pay | Admitting: Surgery

## 2022-07-19 DIAGNOSIS — K439 Ventral hernia without obstruction or gangrene: Secondary | ICD-10-CM

## 2022-07-21 ENCOUNTER — Ambulatory Visit
Admission: RE | Admit: 2022-07-21 | Discharge: 2022-07-21 | Disposition: A | Discharge status: Home / Self Care | Payer: Medicare Other | Source: Ambulatory Visit | Attending: Surgery | Admitting: Surgery

## 2022-07-21 DIAGNOSIS — K439 Ventral hernia without obstruction or gangrene: Secondary | ICD-10-CM | POA: Diagnosis not present

## 2022-07-21 DIAGNOSIS — K429 Umbilical hernia without obstruction or gangrene: Secondary | ICD-10-CM | POA: Diagnosis not present

## 2022-07-21 DIAGNOSIS — K449 Diaphragmatic hernia without obstruction or gangrene: Secondary | ICD-10-CM | POA: Diagnosis not present

## 2022-07-21 DIAGNOSIS — K5730 Diverticulosis of large intestine without perforation or abscess without bleeding: Secondary | ICD-10-CM | POA: Diagnosis not present

## 2022-07-21 MED ORDER — IOPAMIDOL (ISOVUE-300) INJECTION 61%
100.0000 mL | Freq: Once | INTRAVENOUS | Status: AC | PRN
Start: 2022-07-21 — End: 2022-07-21
  Administered 2022-07-21: 100 mL via INTRAVENOUS

## 2022-08-23 DIAGNOSIS — N644 Mastodynia: Secondary | ICD-10-CM | POA: Diagnosis not present

## 2023-01-31 DIAGNOSIS — E039 Hypothyroidism, unspecified: Secondary | ICD-10-CM | POA: Diagnosis not present

## 2023-01-31 DIAGNOSIS — R7309 Other abnormal glucose: Secondary | ICD-10-CM | POA: Diagnosis not present

## 2023-01-31 DIAGNOSIS — Z0000 Encounter for general adult medical examination without abnormal findings: Secondary | ICD-10-CM | POA: Diagnosis not present

## 2023-01-31 DIAGNOSIS — Z9181 History of falling: Secondary | ICD-10-CM | POA: Diagnosis not present

## 2023-01-31 DIAGNOSIS — I10 Essential (primary) hypertension: Secondary | ICD-10-CM | POA: Diagnosis not present

## 2023-01-31 DIAGNOSIS — Z79899 Other long term (current) drug therapy: Secondary | ICD-10-CM | POA: Diagnosis not present

## 2023-01-31 DIAGNOSIS — I700 Atherosclerosis of aorta: Secondary | ICD-10-CM | POA: Diagnosis not present

## 2023-06-20 DIAGNOSIS — C44311 Basal cell carcinoma of skin of nose: Secondary | ICD-10-CM | POA: Diagnosis not present

## 2023-06-20 DIAGNOSIS — L820 Inflamed seborrheic keratosis: Secondary | ICD-10-CM | POA: Diagnosis not present

## 2023-09-03 DIAGNOSIS — Z79899 Other long term (current) drug therapy: Secondary | ICD-10-CM | POA: Diagnosis not present

## 2023-09-03 DIAGNOSIS — R7309 Other abnormal glucose: Secondary | ICD-10-CM | POA: Diagnosis not present

## 2023-09-03 DIAGNOSIS — I700 Atherosclerosis of aorta: Secondary | ICD-10-CM | POA: Diagnosis not present

## 2023-09-03 DIAGNOSIS — E039 Hypothyroidism, unspecified: Secondary | ICD-10-CM | POA: Diagnosis not present

## 2023-09-03 DIAGNOSIS — R0989 Other specified symptoms and signs involving the circulatory and respiratory systems: Secondary | ICD-10-CM | POA: Diagnosis not present

## 2023-09-03 DIAGNOSIS — N1831 Chronic kidney disease, stage 3a: Secondary | ICD-10-CM | POA: Diagnosis not present

## 2023-09-14 DIAGNOSIS — N1831 Chronic kidney disease, stage 3a: Secondary | ICD-10-CM | POA: Diagnosis not present

## 2023-10-29 DIAGNOSIS — Z1231 Encounter for screening mammogram for malignant neoplasm of breast: Secondary | ICD-10-CM | POA: Diagnosis not present

## 2023-12-27 DIAGNOSIS — M1990 Unspecified osteoarthritis, unspecified site: Secondary | ICD-10-CM | POA: Diagnosis not present

## 2023-12-27 DIAGNOSIS — M546 Pain in thoracic spine: Secondary | ICD-10-CM | POA: Diagnosis not present

## 2023-12-27 DIAGNOSIS — N1831 Chronic kidney disease, stage 3a: Secondary | ICD-10-CM | POA: Diagnosis not present

## 2024-01-04 ENCOUNTER — Telehealth: Payer: Self-pay | Admitting: Gastroenterology

## 2024-01-04 ENCOUNTER — Ambulatory Visit: Admitting: Gastroenterology

## 2024-01-04 NOTE — Telephone Encounter (Signed)
 Good Morning Dr. Stacia,  Patient called stating that she would not be able to make her appointment with you this morning at 9:30 due to her friend being sick.   She states that she will call back at a later tine to reschedule.

## 2024-01-09 DIAGNOSIS — I10 Essential (primary) hypertension: Secondary | ICD-10-CM | POA: Diagnosis not present

## 2024-01-09 DIAGNOSIS — R079 Chest pain, unspecified: Secondary | ICD-10-CM | POA: Diagnosis not present

## 2024-01-09 DIAGNOSIS — M546 Pain in thoracic spine: Secondary | ICD-10-CM | POA: Diagnosis not present

## 2024-01-09 DIAGNOSIS — Z79899 Other long term (current) drug therapy: Secondary | ICD-10-CM | POA: Diagnosis not present

## 2024-01-09 DIAGNOSIS — R0602 Shortness of breath: Secondary | ICD-10-CM | POA: Diagnosis not present

## 2024-01-09 DIAGNOSIS — R109 Unspecified abdominal pain: Secondary | ICD-10-CM | POA: Diagnosis not present

## 2024-01-09 DIAGNOSIS — I444 Left anterior fascicular block: Secondary | ICD-10-CM | POA: Diagnosis not present

## 2024-01-09 DIAGNOSIS — R8271 Bacteriuria: Secondary | ICD-10-CM | POA: Diagnosis not present

## 2024-01-09 DIAGNOSIS — Z7902 Long term (current) use of antithrombotics/antiplatelets: Secondary | ICD-10-CM | POA: Diagnosis not present

## 2024-01-29 DIAGNOSIS — R079 Chest pain, unspecified: Secondary | ICD-10-CM | POA: Diagnosis not present

## 2024-01-29 DIAGNOSIS — R109 Unspecified abdominal pain: Secondary | ICD-10-CM | POA: Diagnosis not present

## 2024-01-29 DIAGNOSIS — R0602 Shortness of breath: Secondary | ICD-10-CM | POA: Diagnosis not present

## 2024-02-19 DIAGNOSIS — M545 Low back pain, unspecified: Secondary | ICD-10-CM | POA: Diagnosis not present
